# Patient Record
Sex: Female | Born: 1935 | Race: White | Hispanic: No | Smoking: Former smoker
Health system: Southern US, Community
[De-identification: ages and names within clinical notes are randomized; demographics above are authoritative.]

## PROBLEM LIST (undated history)

## (undated) DIAGNOSIS — I10 Essential (primary) hypertension: Secondary | ICD-10-CM

## (undated) DIAGNOSIS — E109 Type 1 diabetes mellitus without complications: Secondary | ICD-10-CM

## (undated) DIAGNOSIS — I2510 Atherosclerotic heart disease of native coronary artery without angina pectoris: Secondary | ICD-10-CM

## (undated) DIAGNOSIS — I639 Cerebral infarction, unspecified: Secondary | ICD-10-CM

## (undated) DIAGNOSIS — J449 Chronic obstructive pulmonary disease, unspecified: Secondary | ICD-10-CM

## (undated) HISTORY — PX: TOTAL KNEE ARTHROPLASTY: SHX125

---

## 2005-11-19 ENCOUNTER — Ambulatory Visit: Payer: Self-pay | Admitting: Family Medicine

## 2005-11-21 ENCOUNTER — Ambulatory Visit: Payer: Self-pay | Admitting: Family Medicine

## 2005-12-05 ENCOUNTER — Ambulatory Visit: Payer: Self-pay | Admitting: Family Medicine

## 2006-01-05 ENCOUNTER — Ambulatory Visit: Payer: Self-pay | Admitting: Family Medicine

## 2006-11-20 ENCOUNTER — Emergency Department: Payer: Self-pay | Admitting: Emergency Medicine

## 2006-12-15 ENCOUNTER — Emergency Department: Payer: Self-pay | Admitting: Emergency Medicine

## 2007-03-29 ENCOUNTER — Ambulatory Visit: Payer: Self-pay | Admitting: Family Medicine

## 2007-05-06 ENCOUNTER — Other Ambulatory Visit: Payer: Self-pay

## 2007-05-06 ENCOUNTER — Ambulatory Visit: Payer: Self-pay | Admitting: Specialist

## 2007-05-06 ENCOUNTER — Emergency Department: Payer: Self-pay | Admitting: Emergency Medicine

## 2007-05-06 ENCOUNTER — Ambulatory Visit: Payer: Self-pay | Admitting: Cardiology

## 2007-05-13 ENCOUNTER — Ambulatory Visit: Payer: Self-pay | Admitting: Specialist

## 2007-12-05 ENCOUNTER — Other Ambulatory Visit: Payer: Self-pay

## 2007-12-05 ENCOUNTER — Ambulatory Visit: Payer: Self-pay | Admitting: Specialist

## 2008-04-07 ENCOUNTER — Ambulatory Visit: Payer: Self-pay | Admitting: Specialist

## 2008-04-14 ENCOUNTER — Inpatient Hospital Stay: Payer: Self-pay | Admitting: Specialist

## 2008-10-11 ENCOUNTER — Encounter: Payer: Self-pay | Admitting: Specialist

## 2008-11-04 ENCOUNTER — Encounter: Payer: Self-pay | Admitting: Specialist

## 2008-12-05 ENCOUNTER — Encounter: Payer: Self-pay | Admitting: Specialist

## 2009-01-05 ENCOUNTER — Encounter: Payer: Self-pay | Admitting: Specialist

## 2009-02-17 ENCOUNTER — Encounter: Payer: Self-pay | Admitting: Internal Medicine

## 2009-04-26 ENCOUNTER — Ambulatory Visit: Payer: Self-pay | Admitting: Internal Medicine

## 2010-12-04 ENCOUNTER — Emergency Department: Payer: Self-pay | Admitting: Emergency Medicine

## 2013-05-01 ENCOUNTER — Emergency Department (HOSPITAL_COMMUNITY): Payer: Medicare Other

## 2013-05-01 ENCOUNTER — Inpatient Hospital Stay (HOSPITAL_COMMUNITY): Payer: Medicare Other

## 2013-05-01 ENCOUNTER — Inpatient Hospital Stay (HOSPITAL_COMMUNITY)
Admission: EM | Admit: 2013-05-01 | Discharge: 2013-05-13 | DRG: 871 | Disposition: A | Discharge status: Home Health | Payer: Medicare Other | Attending: Internal Medicine | Admitting: Internal Medicine

## 2013-05-01 DIAGNOSIS — M1990 Unspecified osteoarthritis, unspecified site: Secondary | ICD-10-CM | POA: Diagnosis present

## 2013-05-01 DIAGNOSIS — J441 Chronic obstructive pulmonary disease with (acute) exacerbation: Secondary | ICD-10-CM

## 2013-05-01 DIAGNOSIS — J9600 Acute respiratory failure, unspecified whether with hypoxia or hypercapnia: Secondary | ICD-10-CM

## 2013-05-01 DIAGNOSIS — J9601 Acute respiratory failure with hypoxia: Secondary | ICD-10-CM

## 2013-05-01 DIAGNOSIS — E872 Acidosis, unspecified: Secondary | ICD-10-CM | POA: Diagnosis present

## 2013-05-01 DIAGNOSIS — J9690 Respiratory failure, unspecified, unspecified whether with hypoxia or hypercapnia: Secondary | ICD-10-CM

## 2013-05-01 DIAGNOSIS — Z6835 Body mass index (BMI) 35.0-35.9, adult: Secondary | ICD-10-CM

## 2013-05-01 DIAGNOSIS — E119 Type 2 diabetes mellitus without complications: Secondary | ICD-10-CM | POA: Diagnosis present

## 2013-05-01 DIAGNOSIS — J101 Influenza due to other identified influenza virus with other respiratory manifestations: Secondary | ICD-10-CM | POA: Diagnosis present

## 2013-05-01 DIAGNOSIS — E871 Hypo-osmolality and hyponatremia: Secondary | ICD-10-CM | POA: Diagnosis not present

## 2013-05-01 DIAGNOSIS — E876 Hypokalemia: Secondary | ICD-10-CM | POA: Diagnosis not present

## 2013-05-01 DIAGNOSIS — I509 Heart failure, unspecified: Secondary | ICD-10-CM | POA: Diagnosis present

## 2013-05-01 DIAGNOSIS — E669 Obesity, unspecified: Secondary | ICD-10-CM | POA: Diagnosis present

## 2013-05-01 DIAGNOSIS — Z7401 Bed confinement status: Secondary | ICD-10-CM

## 2013-05-01 DIAGNOSIS — I5041 Acute combined systolic (congestive) and diastolic (congestive) heart failure: Secondary | ICD-10-CM | POA: Diagnosis present

## 2013-05-01 DIAGNOSIS — I471 Supraventricular tachycardia: Secondary | ICD-10-CM

## 2013-05-01 DIAGNOSIS — I214 Non-ST elevation (NSTEMI) myocardial infarction: Secondary | ICD-10-CM | POA: Diagnosis not present

## 2013-05-01 DIAGNOSIS — R4020 Unspecified coma: Secondary | ICD-10-CM

## 2013-05-01 DIAGNOSIS — G9340 Encephalopathy, unspecified: Secondary | ICD-10-CM | POA: Diagnosis present

## 2013-05-01 DIAGNOSIS — J111 Influenza due to unidentified influenza virus with other respiratory manifestations: Secondary | ICD-10-CM | POA: Diagnosis present

## 2013-05-01 DIAGNOSIS — F411 Generalized anxiety disorder: Secondary | ICD-10-CM | POA: Diagnosis present

## 2013-05-01 DIAGNOSIS — A419 Sepsis, unspecified organism: Principal | ICD-10-CM | POA: Diagnosis present

## 2013-05-01 DIAGNOSIS — I10 Essential (primary) hypertension: Secondary | ICD-10-CM | POA: Diagnosis present

## 2013-05-01 DIAGNOSIS — Z66 Do not resuscitate: Secondary | ICD-10-CM | POA: Diagnosis not present

## 2013-05-01 DIAGNOSIS — I459 Conduction disorder, unspecified: Secondary | ICD-10-CM | POA: Diagnosis not present

## 2013-05-01 DIAGNOSIS — Z87891 Personal history of nicotine dependence: Secondary | ICD-10-CM

## 2013-05-01 LAB — COMPREHENSIVE METABOLIC PANEL
ALT: 10 U/L (ref 0–35)
ALT: 16 U/L (ref 0–35)
AST: 44 U/L — ABNORMAL HIGH (ref 0–37)
AST: 55 U/L — ABNORMAL HIGH (ref 0–37)
Albumin: 2.6 g/dL — ABNORMAL LOW (ref 3.5–5.2)
Alkaline Phosphatase: 86 U/L (ref 39–117)
CO2: 23 mEq/L (ref 19–32)
CO2: 22 mEq/L (ref 19–32)
Calcium: 7.5 mg/dL — ABNORMAL LOW (ref 8.4–10.5)
Chloride: 96 mEq/L (ref 96–112)
Creatinine, Ser: 0.69 mg/dL (ref 0.50–1.10)
GFR calc Af Amer: >90 mL/min (ref >90)
GFR calc non Af Amer: 81 mL/min — ABNORMAL LOW (ref >90)
GFR calc non Af Amer: 82 mL/min — ABNORMAL LOW (ref >90)
Glucose, Bld: 385 mg/dL — ABNORMAL HIGH (ref 70–99)
Potassium: 5 mEq/L (ref 3.5–5.1)
Sodium: 129 mEq/L — ABNORMAL LOW (ref 135–145)
Sodium: 130 mEq/L — ABNORMAL LOW (ref 135–145)
Total Protein: 8.1 g/dL (ref 6.0–8.3)
Total Protein: 7.1 g/dL (ref 6.0–8.3)

## 2013-05-01 LAB — CBC WITH DIFFERENTIAL/PLATELET
Basophils Relative: 0 % (ref 0–1)
Eosinophils Absolute: 0.3 10*3/uL (ref 0.0–0.7)
Eosinophils Absolute: 0 10*3/uL (ref 0.0–0.7)
Eosinophils Relative: 0 % (ref 0–5)
HCT: 40 % (ref 36.0–46.0)
Hemoglobin: 14.3 g/dL (ref 12.0–15.0)
Hemoglobin: 12.7 g/dL (ref 12.0–15.0)
Lymphocytes Relative: 26 % (ref 12–46)
Lymphs Abs: 7.3 10*3/uL — ABNORMAL HIGH (ref 0.7–4.0)
Lymphs Abs: 1.6 10*3/uL (ref 0.7–4.0)
MCH: 29.2 pg (ref 26.0–34.0)
MCH: 28.2 pg (ref 26.0–34.0)
MCHC: 31.8 g/dL (ref 30.0–36.0)
MCV: 90 fL (ref 78.0–100.0)
MCV: 88.9 fL (ref 78.0–100.0)
Monocytes Absolute: 1.4 10*3/uL — ABNORMAL HIGH (ref 0.1–1.0)
Monocytes Absolute: 1.6 10*3/uL — ABNORMAL HIGH (ref 0.1–1.0)
Monocytes Relative: 7 % (ref 3–12)
Neutro Abs: 18.2 10*3/uL — ABNORMAL HIGH (ref 1.7–7.7)
Neutrophils Relative %: 68 % (ref 43–77)
Neutrophils Relative %: 85 % — ABNORMAL HIGH (ref 43–77)
Platelets: 436 10*3/uL — ABNORMAL HIGH (ref 150–400)
RBC: 4.89 MIL/uL (ref 3.87–5.11)
RBC: 4.5 MIL/uL (ref 3.87–5.11)
RDW: 13.4 % (ref 11.5–15.5)
WBC: 28.2 10*3/uL — ABNORMAL HIGH (ref 4.0–10.5)

## 2013-05-01 LAB — POCT I-STAT 3, ART BLOOD GAS (G3+)
Acid-base deficit: 6 mmol/L — ABNORMAL HIGH (ref 0.0–2.0)
Bicarbonate: 24.5 mEq/L — ABNORMAL HIGH (ref 20.0–24.0)
O2 Saturation: 100 %
TCO2: 27 mmol/L (ref 0–100)
pCO2 arterial: 74.6 mmHg (ref 35.0–45.0)
pH, Arterial: 7.131 — CL (ref 7.350–7.450)
pO2, Arterial: 462 mmHg — ABNORMAL HIGH (ref 80.0–100.0)

## 2013-05-01 LAB — URINE MICROSCOPIC-ADD ON

## 2013-05-01 LAB — PRO B NATRIURETIC PEPTIDE: Pro B Natriuretic peptide (BNP): 3477 pg/mL — ABNORMAL HIGH (ref 0–450)

## 2013-05-01 LAB — RAPID URINE DRUG SCREEN, HOSP PERFORMED
Amphetamines: NOT DETECTED
Barbiturates: NOT DETECTED
Cocaine: NOT DETECTED
Opiates: NOT DETECTED
Tetrahydrocannabinol: NOT DETECTED

## 2013-05-01 LAB — URINALYSIS, ROUTINE W REFLEX MICROSCOPIC
Bilirubin Urine: NEGATIVE
Ketones, ur: NEGATIVE mg/dL
Nitrite: NEGATIVE
Protein, ur: >300 mg/dL — AB
pH: 7 (ref 5.0–8.0)

## 2013-05-01 LAB — AMYLASE: Amylase: 127 U/L — ABNORMAL HIGH (ref 0–105)

## 2013-05-01 LAB — CG4 I-STAT (LACTIC ACID)
Lactic Acid, Venous: 3.24 mmol/L — ABNORMAL HIGH (ref 0.5–2.2)
Lactic Acid, Venous: 3.42 mmol/L — ABNORMAL HIGH (ref 0.5–2.2)

## 2013-05-01 LAB — PHOSPHORUS: Phosphorus: 4.4 mg/dL (ref 2.3–4.6)

## 2013-05-01 LAB — PROTIME-INR: INR: 1.15 (ref 0.00–1.49)

## 2013-05-01 LAB — TROPONIN I: Troponin I: 1.3 ng/mL (ref <0.30)

## 2013-05-01 LAB — LACTIC ACID, PLASMA: Lactic Acid, Venous: 2.4 mmol/L — ABNORMAL HIGH (ref 0.5–2.2)

## 2013-05-01 LAB — STREP PNEUMONIAE URINARY ANTIGEN: Strep Pneumo Urinary Antigen: NEGATIVE

## 2013-05-01 LAB — LIPASE, BLOOD: Lipase: 12 U/L (ref 11–59)

## 2013-05-01 MED ORDER — MAGNESIUM SULFATE 40 MG/ML IJ SOLN
4.0000 g | Freq: Once | INTRAMUSCULAR | Status: AC
  Administered 2013-05-02: 4 g via INTRAVENOUS
  Filled 2013-05-01: qty 100

## 2013-05-01 MED ORDER — ETOMIDATE 2 MG/ML IV SOLN
INTRAVENOUS | Status: AC
  Administered 2013-05-01: 30 mg via INTRAVENOUS
  Filled 2013-05-01: qty 20

## 2013-05-01 MED ORDER — ALBUTEROL SULFATE (5 MG/ML) 0.5% IN NEBU
2.5000 mg | INHALATION_SOLUTION | RESPIRATORY_TRACT | Status: DC
  Administered 2013-05-01 – 2013-05-03 (×10): 2.5 mg via RESPIRATORY_TRACT
  Filled 2013-05-01 (×10): qty 0.5

## 2013-05-01 MED ORDER — ASPIRIN 300 MG RE SUPP
300.0000 mg | RECTAL | Status: AC
  Administered 2013-05-01: 300 mg via RECTAL
  Filled 2013-05-01: qty 1

## 2013-05-01 MED ORDER — MIDAZOLAM HCL 2 MG/2ML IJ SOLN
2.0000 mg | Freq: Once | INTRAMUSCULAR | Status: AC
  Administered 2013-05-01: 2 mg via INTRAVENOUS
  Filled 2013-05-01: qty 2

## 2013-05-01 MED ORDER — ROCURONIUM BROMIDE 50 MG/5ML IV SOLN
25.0000 mg | Freq: Once | INTRAVENOUS | Status: AC
  Administered 2013-05-01: 25 mg via INTRAVENOUS
  Filled 2013-05-01: qty 2.5

## 2013-05-01 MED ORDER — SODIUM CHLORIDE 0.9 % IV SOLN
INTRAVENOUS | Status: DC
  Administered 2013-05-01: 50 mL/h via INTRAVENOUS
  Administered 2013-05-03 – 2013-05-10 (×4): via INTRAVENOUS

## 2013-05-01 MED ORDER — SODIUM CHLORIDE 0.9 % IV SOLN: 10.0000 ug/h | INTRAVENOUS | Status: DC

## 2013-05-01 MED ORDER — VANCOMYCIN HCL IN DEXTROSE 750-5 MG/150ML-% IV SOLN
750.0000 mg | Freq: Two times a day (BID) | INTRAVENOUS | Status: DC
  Administered 2013-05-02 – 2013-05-03 (×3): 750 mg via INTRAVENOUS
  Filled 2013-05-01 (×5): qty 150

## 2013-05-01 MED ORDER — PROPOFOL 10 MG/ML IV EMUL
0.0000 ug/kg/min | INTRAVENOUS | Status: DC
  Administered 2013-05-01: 5 ug/kg/min via INTRAVENOUS
  Administered 2013-05-02: 30 ug/kg/min via INTRAVENOUS
  Administered 2013-05-03: 40 ug/kg/min via INTRAVENOUS
  Filled 2013-05-01 (×4): qty 100

## 2013-05-01 MED ORDER — METHYLPREDNISOLONE SODIUM SUCC 125 MG IJ SOLR
80.0000 mg | Freq: Three times a day (TID) | INTRAMUSCULAR | Status: DC
  Administered 2013-05-01 – 2013-05-06 (×14): 80 mg via INTRAVENOUS
  Filled 2013-05-01 (×17): qty 1.28

## 2013-05-01 MED ORDER — FENTANYL CITRATE 0.05 MG/ML IJ SOLN
100.0000 ug | Freq: Once | INTRAMUSCULAR | Status: AC
  Administered 2013-05-01: 100 ug via INTRAVENOUS

## 2013-05-01 MED ORDER — ASPIRIN 81 MG PO CHEW: 324.0000 mg | CHEWABLE_TABLET | ORAL | Status: AC

## 2013-05-01 MED ORDER — SODIUM CHLORIDE 0.9 % IV SOLN: 250.0000 mL | INTRAVENOUS | Status: DC | PRN

## 2013-05-01 MED ORDER — IPRATROPIUM BROMIDE 0.02 % IN SOLN
0.5000 mg | RESPIRATORY_TRACT | Status: DC
  Administered 2013-05-01 – 2013-05-06 (×29): 0.5 mg via RESPIRATORY_TRACT
  Filled 2013-05-01 (×33): qty 2.5

## 2013-05-01 MED ORDER — ASPIRIN 81 MG PO CHEW
81.0000 mg | CHEWABLE_TABLET | Freq: Every day | ORAL | Status: DC
  Administered 2013-05-02 (×2): 81 mg via ORAL
  Filled 2013-05-01 (×2): qty 1

## 2013-05-01 MED ORDER — DEXTROSE 5 % IV SOLN
2.0000 g | Freq: Once | INTRAVENOUS | Status: AC
  Administered 2013-05-01: 2 g via INTRAVENOUS
  Filled 2013-05-01: qty 2

## 2013-05-01 MED ORDER — PANTOPRAZOLE SODIUM 40 MG PO PACK
40.0000 mg | PACK | Freq: Every day | ORAL | Status: DC
  Administered 2013-05-02: 40 mg
  Filled 2013-05-01 (×3): qty 20

## 2013-05-01 MED ORDER — OSELTAMIVIR PHOSPHATE 75 MG PO CAPS
75.0000 mg | ORAL_CAPSULE | Freq: Two times a day (BID) | ORAL | Status: DC
Start: 2013-05-01 — End: 2013-05-02
  Administered 2013-05-01 – 2013-05-02 (×2): 75 mg via ORAL
  Filled 2013-05-01 (×4): qty 1

## 2013-05-01 MED ORDER — ACETAMINOPHEN 650 MG RE SUPP
650.0000 mg | Freq: Once | RECTAL | Status: AC
  Administered 2013-05-01: 650 mg via RECTAL
  Filled 2013-05-01: qty 1

## 2013-05-01 MED ORDER — INSULIN ASPART 100 UNIT/ML ~~LOC~~ SOLN
0.0000 [IU] | SUBCUTANEOUS | Status: DC
  Administered 2013-05-02 (×3): 11 [IU] via SUBCUTANEOUS

## 2013-05-01 MED ORDER — LIDOCAINE HCL (CARDIAC) 20 MG/ML IV SOLN
INTRAVENOUS | Status: AC
  Filled 2013-05-01: qty 5

## 2013-05-01 MED ORDER — MIDAZOLAM HCL 2 MG/2ML IJ SOLN: 2.0000 mg | Freq: Once | INTRAMUSCULAR | Status: DC

## 2013-05-01 MED ORDER — HEPARIN SODIUM (PORCINE) 5000 UNIT/ML IJ SOLN
5000.0000 [IU] | Freq: Three times a day (TID) | INTRAMUSCULAR | Status: DC
  Administered 2013-05-01 – 2013-05-02 (×2): 5000 [IU] via SUBCUTANEOUS
  Filled 2013-05-01 (×5): qty 1

## 2013-05-01 MED ORDER — INSULIN GLARGINE 100 UNIT/ML ~~LOC~~ SOLN
10.0000 [IU] | Freq: Every day | SUBCUTANEOUS | Status: DC
  Administered 2013-05-02: 10 [IU] via SUBCUTANEOUS
  Filled 2013-05-01 (×2): qty 0.1

## 2013-05-01 MED ORDER — INSULIN ASPART 100 UNIT/ML ~~LOC~~ SOLN
2.0000 [IU] | SUBCUTANEOUS | Status: DC
  Administered 2013-05-01: 6 [IU] via SUBCUTANEOUS

## 2013-05-01 MED ORDER — VANCOMYCIN HCL IN DEXTROSE 1-5 GM/200ML-% IV SOLN
1000.0000 mg | Freq: Once | INTRAVENOUS | Status: AC
  Administered 2013-05-01: 1000 mg via INTRAVENOUS
  Filled 2013-05-01: qty 200

## 2013-05-01 MED ORDER — SODIUM CHLORIDE 0.9 % IV BOLUS (SEPSIS)
1000.0000 mL | Freq: Once | INTRAVENOUS | Status: AC
  Administered 2013-05-01: 1000 mL via INTRAVENOUS

## 2013-05-01 MED ORDER — ROCURONIUM BROMIDE 50 MG/5ML IV SOLN
INTRAVENOUS | Status: AC
  Administered 2013-05-01: 25 mg via INTRAVENOUS
  Filled 2013-05-01: qty 2

## 2013-05-01 MED ORDER — PIPERACILLIN-TAZOBACTAM 3.375 G IVPB 30 MIN
3.3750 g | Freq: Once | INTRAVENOUS | Status: AC
  Administered 2013-05-01: 3.375 g via INTRAVENOUS

## 2013-05-01 MED ORDER — SODIUM CHLORIDE 0.9 % IV SOLN: 1.0000 mg/h | INTRAVENOUS | Status: DC

## 2013-05-01 MED ORDER — PIPERACILLIN-TAZOBACTAM 3.375 G IVPB
3.3750 g | Freq: Three times a day (TID) | INTRAVENOUS | Status: DC
  Administered 2013-05-02 – 2013-05-05 (×10): 3.375 g via INTRAVENOUS
  Filled 2013-05-01 (×13): qty 50

## 2013-05-01 MED ORDER — SUCCINYLCHOLINE CHLORIDE 20 MG/ML IJ SOLN
INTRAMUSCULAR | Status: AC
  Filled 2013-05-01: qty 1

## 2013-05-01 MED ORDER — ETOMIDATE 2 MG/ML IV SOLN
30.0000 mg | Freq: Once | INTRAVENOUS | Status: AC
  Administered 2013-05-01: 30 mg via INTRAVENOUS

## 2013-05-01 NOTE — ED Notes (Signed)
Critical care at bedside  

## 2013-05-01 NOTE — ED Notes (Signed)
Provider talking with family to verify allergies.

## 2013-05-01 NOTE — ED Notes (Signed)
NOTIFIED DR/. GOLDSTON IN PERSON OF PATIENTS LAB RESULTS OF CG4 LACTIC ACID = =3.42 mmoI/L ,@17 ;32 PM , 12/26 /2014.

## 2013-05-01 NOTE — ED Notes (Signed)
EMS called unresponsive CBG 390 gave narcan 4mg  with some improvement with movement pupils pinpoint. Upon arrival patient unresponsive protecting own airway EDP at bedside.

## 2013-05-01 NOTE — ED Notes (Signed)
Notified CT delay on CT head respiratory has to adjust ET tube. Will call when completed.

## 2013-05-01 NOTE — ED Provider Notes (Signed)
CSN: [id number]     Arrival date & time 05/01/13  1619 History   First MD Initiated Contact with Patient 05/01/13 1619     Chief Complaint  Patient presents with  . Altered Mental Status   (Consider location/radiation/quality/duration/timing/severity/associated sxs/prior Treatment) The history is provided by the EMS personnel. The history is limited by the condition of the patient.   Level 5 caveat- unresponsive. This is a 78 year old female with past medical history significant for hypertension and COPD presenting to the ED for unresponsiveness.  Pt lives with her daughter at their home, limited hx was provided.  EMS was initially called for cardiac arrest however on arrival, pt had palpable pulses, CPR was not initiated.  Pt was found to be lying in feces and urine, all with strong odor.  Unsure how long pt was in this condition.  Pt has remained unresponsive during EMS transport despite use of narcan.  CBG 391, tachycardic to 140's, hypotensive on arrival.  Family has arrived-- states pt was normal until around 12pm today when grandson brought her lunch.  At baseline, pt is non-ambulatory at baseline but does all ADL's for herself including medication administration.  No prior hx of TIA or stroke but did have seizures during prior admission-- unknown cause.  Daughter states prior episodes of DKA and urosepsis with similar presentation.  Family states they have attempted to acquire home health or SNF placement, however pt refuses any kind of help.  Pt is full code per family.  No past medical history on file. No past surgical history on file. No family history on file. History  Substance Use Topics  . Smoking status: Not on file  . Smokeless tobacco: Not on file  . Alcohol Use: Not on file   OB History   No data available     Review of Systems  Unable to perform ROS: Acuity of condition    Allergies  Review of patient's allergies indicates not on file.  Home Medications  No  current outpatient prescriptions on file. There were no vitals taken for this visit.  Physical Exam  Nursing note and vitals reviewed. Constitutional: She appears distressed. Face mask in place.  Obese, Pt soaked in foul smelling urine, minimally responsive  HENT:  Head: Normocephalic and atraumatic.  Mouth/Throat: Oropharynx is clear and moist.  Eyes: Conjunctivae and EOM are normal. Pupils are equal, round, and reactive to light.  Pinpoint pupils but reactive bilaterally  Neck: Normal range of motion. Neck supple.  Cardiovascular: Regular rhythm and normal heart sounds.  Tachycardia present.   Pulmonary/Chest: Breath sounds normal. Accessory muscle usage present. She is in respiratory distress.  Shallow, severely labored breathing with accessory muscle use and obvious retractions  Abdominal: Soft. Bowel sounds are normal.  Neurological: GCS eye subscore is 3. GCS verbal subscore is 1. GCS motor subscore is 4.  Pt will open eyes to verbal stimuli, some withdrawal with pain, GCS 8  Skin:  Pale, dry  Psychiatric: She has a normal mood and affect.    ED Course  INTUBATION Date/Time: 05/01/2013 6:41 PM Performed by: Garlon Hatchet Authorized by: Garlon Hatchet   (including critical care time)   INTUBATION Performed by: Garlon Hatchet  Required items: required blood products, implants, devices, and special equipment available Patient identity confirmed: provided demographic data and hospital-assigned identification number Time out: Immediately prior to procedure a "time out" was called to verify the correct patient, procedure, equipment, support staff and site/side marked as required.  Indications:  respiratory failure, unresponsive  Intubation method: direct laryngoscopy  Preoxygenation: BVM  Sedatives: 30 Etomidate Paralytic: 50 Rocuronium  Tube Size: 6.5 cuffed, 21 at lip  Post-procedure assessment: chest rise and ETCO2 monitor Breath sounds: equal and absent over  the epigastrium Tube secured with: ETT holder Chest x-ray interpreted by radiologist and me.  Chest x-ray findings: endotracheal tube in appropriate position  Patient tolerated the procedure well with no immediate complications.    CRITICAL CARE Performed by: Garlon Hatchet   Total critical care time: 60  Critical care time was exclusive of separately billable procedures and treating other patients.  Critical care was necessary to treat or prevent imminent or life-threatening deterioration.  Critical care was time spent personally by me on the following activities: development of treatment plan with patient and/or surrogate as well as nursing, discussions with consultants, evaluation of patient's response to treatment, examination of patient, obtaining history from patient or surrogate, ordering and performing treatments and interventions, ordering and review of laboratory studies, ordering and review of radiographic studies, pulse oximetry and re-evaluation of patient's condition.  Labs Review Labs Reviewed  CBC WITH DIFFERENTIAL - Abnormal; Notable for the following:    WBC 28.2 (*)    Platelets 436 (*)    Neutro Abs 19.2 (*)    Lymphs Abs 7.3 (*)    Monocytes Absolute 1.4 (*)    All other components within normal limits  COMPREHENSIVE METABOLIC PANEL - Abnormal; Notable for the following:    Sodium 129 (*)    Chloride 91 (*)    Glucose, Bld 385 (*)    Albumin 3.1 (*)    AST 44 (*)    GFR calc non Af Amer 81 (*)    All other components within normal limits  URINALYSIS, ROUTINE W REFLEX MICROSCOPIC - Abnormal; Notable for the following:    Glucose, UA 250 (*)    Hgb urine dipstick MODERATE (*)    Protein, ur >300 (*)    All other components within normal limits  GLUCOSE, CAPILLARY - Abnormal; Notable for the following:    Glucose-Capillary 336 (*)    All other components within normal limits  URINE MICROSCOPIC-ADD ON - Abnormal; Notable for the following:    Squamous  Epithelial / LPF FEW (*)    Bacteria, UA FEW (*)    All other components within normal limits  POCT I-STAT 3, BLOOD GAS (G3+) - Abnormal; Notable for the following:    pH, Arterial 7.131 (*)    pCO2 arterial 74.6 (*)    pO2, Arterial 462.0 (*)    Bicarbonate 24.5 (*)    Acid-base deficit 6.0 (*)    All other components within normal limits  CG4 I-STAT (LACTIC ACID) - Abnormal; Notable for the following:    Lactic Acid, Venous 3.24 (*)    All other components within normal limits  CG4 I-STAT (LACTIC ACID) - Abnormal; Notable for the following:    Lactic Acid, Venous 3.42 (*)    All other components within normal limits  CG4 I-STAT (LACTIC ACID) - Abnormal; Notable for the following:    Lactic Acid, Venous 2.95 (*)    All other components within normal limits  CULTURE, BLOOD (ROUTINE X 2)  CULTURE, BLOOD (ROUTINE X 2)  URINE CULTURE  RESPIRATORY VIRUS PANEL  CULTURE, BLOOD (ROUTINE X 2)  CULTURE, BLOOD (ROUTINE X 2)  URINE CULTURE  CULTURE, RESPIRATORY (NON-EXPECTORATED)  TROPONIN I  PROTIME-INR  CK  URINE RAPID DRUG SCREEN (HOSP PERFORMED)  INFLUENZA  PANEL BY PCR  LEGIONELLA ANTIGEN, URINE  STREP PNEUMONIAE URINARY ANTIGEN  COMPREHENSIVE METABOLIC PANEL  MAGNESIUM  PHOSPHORUS  AMYLASE  LIPASE, BLOOD  TROPONIN I  TROPONIN I  TROPONIN I  LACTIC ACID, PLASMA  PROCALCITONIN  PRO B NATRIURETIC PEPTIDE  CORTISOL  CBC WITH DIFFERENTIAL  PROTIME-INR  APTT  D-DIMER, QUANTITATIVE  BLOOD GAS, ARTERIAL  CBC  BASIC METABOLIC PANEL  BLOOD GAS, ARTERIAL  MAGNESIUM  PHOSPHORUS  TRIGLYCERIDES   Imaging Review Dg Chest Portable 1 View  05/01/2013   CLINICAL DATA:  Assess endotracheal tube position.  EXAM: PORTABLE CHEST - 1 VIEW  COMPARISON:  None.  FINDINGS: The endotracheal tube tip lies 5 cm above the crotch of the carina. The lungs are mildly hyperinflated. There is no alveolar infiltrate. There is minimal prominence of the interstitial markings bilaterally. The  cardiopericardial silhouette is top-normal in size. The pulmonary vascularity is not engorged. There is no pleural effusion or pneumothorax. The observed portions of the bony thorax exhibit no acute abnormalities.  IMPRESSION: The endotracheal tube tip appears to be well-positioned. There is no evidence of pneumonia. Minimal prominence of the pulmonary interstitial markings is nonspecific.   Electronically Signed   By: David  [place]   On: 05/01/2013 17:12    EKG Interpretation    Date/Time:  Friday May 01 2013 16:33:00 EST Ventricular Rate:  139 PR Interval:  83 QRS Duration: 113 QT Interval:  366 QTC Calculation: 557 R Axis:   -76 Text Interpretation:  Sinus or ectopic atrial tachycardia Left atrial enlargement Abnormal R-wave progression, late transition Left bundle branch block Prolonged QT interval Probable RV involvement, suggest recording right precordial leads No old tracing to compare Confirmed by GOLDSTON  MD, SCOTT ([id number]) on 05/01/2013 6:06:09 PM            MDM   1. Respiratory failure   2. Respiratory acidosis    On arrival pt covered in foul smelling urine, minimally responsive, GCS 8 with signs of respiratory distress.  Pt was intubated without immediate complication.  Pt remains febrile and tachycardic, Level 1 code sepsis activated with suspected urinary source-- broad spectrum vanc and zosyn started in the ED, critical care consulted.    EKG sinus tachycardia.  Blood gas with respiratory acidosis.  Significant leukocytosis at 28.2, lactic acid elevated at 3.24.  Hyponatremia and hypochloridemia at 129 and 91 respectively. U/a with only few bacteria. CT head pending at this time.  Blood and urine cultures obtained.  Critical care, Dr. Marchelle Gearing, has evaluated pt in the ED and will admit to ICU.  Garlon Hatchet, PA-C 05/01/13 2133

## 2013-05-01 NOTE — H&P (Addendum)
PULMONARY  / CRITICAL CARE MEDICINE  Name: Brenda Hogan MRN: [id number] DOB: 04-27-1935 PCP Hyman Hopes, MD    ADMISSION DATE:  05/01/2013  LOS 0 days   CONSULTATION DATE:  05/01/2013   REFERRING MD :  Dr Criss Alvine of [facility] PRIMARY SERVICE: PCCM  CHIEF COMPLAINT:  Acute resp falure, acidosis, unresponsive  BRIEF PATIENT DESCRIPTION: see hpi  SIGNIFICANT EVENTS / STUDIES:  05/01/2013 - Admit   LINES / TUBES: ETT 05/01/2013   CULTURES: Blood  Urine  Trach aspirate Flu PCR Multoplex Virus PCR  ANTIBIOTICS: ceftriazone 12/26 >> vanc 12/26>> tamifly 12/26 (5 days)  HISTORY OF PRESENT ILLNESS:   78 year old obese female, bed ridden x 7 years with prior smoking and hx copd nos. She  can transfer with assist only due to "bad knees".  Has has exposure to non-flu resp virus amongst grand and great grand kids last few days. She did have flu shot. Was well until thiis morning when grand daughter noticed increaed cough and congestion in morning with less appeptie. By late afternnon patient was getting more somnolent and rapildy becaome cyanotic and unresponsive. Blood suyar 380mg %.  EMS found aptient unresponsive. Patient intubated in ER with size 6.0 et tube. Never lost pulse. Per family similar episode in past due to UTI and high sugar. Initialy concern at ER was UTI but urine clear. Post intubaion patient febrile and ABG showed significant acute resp acidosis post intubaion suggesting AECOPD. PCCM admitting 05/01/2013  Note: foul body odor across all room   PAST MEDICAL HISTORY :  No past medical history on file.   No family history on file.   History   Social History  . Marital Status: Divorced    Spouse Name: N/A    Number of Children: N/A  . Years of Education: N/A   Occupational History  . Not on file.   Social History Main Topics  . Smoking status: Not on file  . Smokeless tobacco: Not on file  . Alcohol Use: Not on file  . Drug Use:  Not on file  . Sexual Activity: Not on file   Other Topics Concern  . Not on file   Social History Narrative  . No narrative on file     Allergies not on file    (Not in an outpatient encounter)     REVIEW OF SYSTEMS:  Not elicitable bedause patient intuated and unresponsive  SUBJECTIVE:   VITAL SIGNS: Filed Vitals:   05/01/13 1715 05/01/13 1730 05/01/13 1800 05/01/13 1806  BP: 155/76 151/56 155/65   Pulse:  138 135 134  Temp:      TempSrc:      Resp: 22 20 24 30   Height:      SpO2: 100% 100% 99% 99%      \   VENTILATOR SETTINGS: Vent Mode:  [-] PRVC FiO2 (%):  [30 %] 30 % Set Rate:  [28 bmp] 28 bmp Vt Set:  [420 mL] 420 mL PEEP:  [5 cmH20] 5 cmH20 Plateau Pressure:  [4 cmH20-21 cmH20] 20 cmH20 INTAKE / OUTPUT:       PHYSICAL EXAMINATION: General:  Obese female, looks critically ill Neuro:  Unresponsive witout sedation HEENT:  No neck stiffness, PEERL +. No seizure observed. Normal muscle tone Cardiovascular:  RRR +. Hypertensive Lungs:  Coarse. Perioidic vent dysnchrony Abdomen:  Obese, soft, non-tender, no organomegaly Musculoskeletal:  Non cyanosis, no clubbing, no edema Skin:  DRy skin LE. [place] unhygienic. Ventrally intact sking  LABS: PULMONARY  Recent Labs Lab 05/01/13 1723  PHART 7.131*  PCO2ART 74.6*  PO2ART 462.0*  HCO3 24.5*  TCO2 27  O2SAT 100.0    CBC  Recent Labs Lab 05/01/13 1643  HGB 14.3  HCT 44.0  WBC 28.2*  PLT 436*    COAGULATION  Recent Labs Lab 05/01/13 1643  INR 1.10    CARDIAC   Recent Labs Lab 05/01/13 1648  TROPONINI <0.30   No results found for this basename: PROBNP,  in the last 168 hours   CHEMISTRY  Recent Labs Lab 05/01/13 1643  NA 129*  K 5.0  CL 91*  CO2 23  GLUCOSE 385*  BUN 15  CREATININE 0.74  CALCIUM 8.5   The CrCl is unknown because both a height and weight (above a minimum accepted value) are required for this calculation.   LIVER  Recent Labs Lab  05/01/13 1643  AST 44*  ALT 10  ALKPHOS 86  BILITOT 0.6  PROT 8.1  ALBUMIN 3.1*  INR 1.10     INFECTIOUS  Recent Labs Lab 05/01/13 1710 05/01/13 1729  LATICACIDVEN 3.24* 3.42*     ENDOCRINE CBG (last 3)   Recent Labs  05/01/13 1645  GLUCAP 336*         IMAGING x48h  Dg Chest Portable 1 View  05/01/2013   CLINICAL DATA:  Assess endotracheal tube position.  EXAM: PORTABLE CHEST - 1 VIEW  COMPARISON:  None.  FINDINGS: The endotracheal tube tip lies 5 cm above the crotch of the carina. The lungs are mildly hyperinflated. There is no alveolar infiltrate. There is minimal prominence of the interstitial markings bilaterally. The cardiopericardial silhouette is top-normal in size. The pulmonary vascularity is not engorged. There is no pleural effusion or pneumothorax. The observed portions of the bony thorax exhibit no acute abnormalities.  IMPRESSION: The endotracheal tube tip appears to be well-positioned. There is no evidence of pneumonia. Minimal prominence of the pulmonary interstitial markings is nonspecific.   Electronically Signed   By: David  [place]   On: 05/01/2013 17:12       ASSESSMENT / PLAN:  PULMONARY A:Likely AECOPD with acute resp failure. ? Due to virus P:   Permissive hypercapnia full vent support BD STeroids  CARDIOVASCULAR A: Snius tach  P:  Rule out MI Check bnp  RENAL A:  Moderate hyponatremia P:   Monitor If seizes or does not improve, consider 3% saline with CVL  GASTROINTESTINAL A:  Nil acute P:   ppi Npo ogt tube to lis  HEMATOLOGIC A:  leukocytosis P:  mmonitor  INFECTIOUS A:  AECOPD v UTI; clear CXR   Sepsis P:   PCT Lactate Cultures Vanc/ceftriazone/tamifly  ENDOCRINE A:  Hyperglycemia but ? DKA - soundsmore like AECOPD P:   ICU hyperglycemia protocol  NEUROLOGIC A:  Unresponsive/coma, likely due to aecopd/sepsis  P:   dioprivan Ct head Consider other causes if unimproved; at this time no LP  indication   TODAY'S SUMMARY:  12/26 - family updated. Full code      The patient is critically ill with multiple organ systems failure and requires high complexity decision making for assessment and support, frequent evaluation and titration of therapies, application of advanced monitoring technologies and extensive interpretation of multiple databases.   Critical Care Time devoted to patient care services described in this note is  45  Minutes.  Dr. Kalman Shan, M.D., [facility].C.P Pulmonary and Critical Care Medicine Staff Physician [facility] [place] [place]  Pager: [phone], If no answer or between  15:00h - 7:00h: call [phone]  05/01/2013 6:12 PM

## 2013-05-01 NOTE — ED Notes (Addendum)
Spoke with Pharmacy stated ordered to come from main pharmacy and sending now via tube system. Respiratory adjusted ET tube and Xray called for portable x-ray to be completed.

## 2013-05-01 NOTE — Progress Notes (Signed)
eLink Physician-Brief Progress Note Patient Name: Brenda Hogan DOB: [birth year] MRN: [id number]  Date of Service  05/01/2013   HPI/Events of Note  Mg 1.3 Pos trop   eICU Interventions  ASA 325, LBBB on ekg 4g Mag given Rpt ABG   Intervention Category Major Interventions: Acid-Base disturbance - evaluation and management Intermediate Interventions: Electrolyte abnormality - evaluation and management;Diagnostic test evaluation  [name] V. 05/01/2013, 11:52 PM

## 2013-05-01 NOTE — ED Notes (Signed)
EDP wanted blood cultures and urine to be sent prior to antibiotic start.  Immediately after temp foley placed and urine collect antibiotics started at 1725.

## 2013-05-01 NOTE — ED Notes (Signed)
After ET placement verified by ED Doctor after Respiratory adjusted ET tube will transport patient to CT then to floor.

## 2013-05-01 NOTE — ED Notes (Signed)
Advance OG 5cm and placed on intermittent suction.

## 2013-05-01 NOTE — Progress Notes (Signed)
Advanced tube to 23 per order

## 2013-05-01 NOTE — Progress Notes (Signed)
ANTIBIOTIC CONSULT NOTE - INITIAL  Pharmacy Consult for vancomycin + zosyn Indication: sepsis vs PNA vs UTI  Not on File  Patient Measurements: Height: 5' (152.4 cm) Weight: 200 lb (90.719 kg) IBW/kg (Calculated) : 45.5 Adjusted Body Weight:   Vital Signs: Temp: 99.6 F (37.6 C) (12/26 1915) Temp src: Rectal (12/26 1915) BP: 142/79 mmHg (12/26 1915) Pulse Rate: 127 (12/26 1915) Intake/Output from previous day:   Intake/Output from this shift:    Labs:  Recent Labs  05/01/13 1643  WBC 28.2*  HGB 14.3  PLT 436*  CREATININE 0.74   Estimated Creatinine Clearance: 60.1 ml/min (by C-G formula based on Cr of 0.74). No results found for this basename: VANCOTROUGH, VANCOPEAK, VANCORANDOM, GENTTROUGH, GENTPEAK, GENTRANDOM, TOBRATROUGH, TOBRAPEAK, TOBRARND, AMIKACINPEAK, AMIKACINTROU, AMIKACIN,  in the last 72 hours   Microbiology: No results found for this or any previous visit (from the past 720 hour(s)).  Medical History: No past medical history on file.  Medications:  Anti-infectives   Start     Dose/Rate Route Frequency Ordered Stop   05/02/13 0800  vancomycin (VANCOCIN) IVPB 750 mg/150 ml premix     750 mg 150 mL/hr over 60 Minutes Intravenous Every 12 hours 05/01/13 1932     05/02/13 0200  piperacillin-tazobactam (ZOSYN) IVPB 3.375 g     3.375 g 12.5 mL/hr over 240 Minutes Intravenous Every 8 hours 05/01/13 1932     05/01/13 2200  oseltamivir (TAMIFLU) capsule 75 mg     75 mg Oral 2 times daily 05/01/13 1829 05/06/13 2159   05/01/13 1845  piperacillin-tazobactam (ZOSYN) IVPB 3.375 g     3.375 g 100 mL/hr over 30 Minutes Intravenous  Once 05/01/13 1838     05/01/13 1845  vancomycin (VANCOCIN) IVPB 1000 mg/200 mL premix     1,000 mg 200 mL/hr over 60 Minutes Intravenous  Once 05/01/13 1838     05/01/13 1715  cefTRIAXone (ROCEPHIN) 2 g in dextrose 5 % 50 mL IVPB     2 g 100 mL/hr over 30 Minutes Intravenous  Once 05/01/13 1712 05/01/13 1750      Assessment: 78 yof presented to the ED with acute respiratory failure. To start empiric broad-spectrum antibiotics for pneumonia vs sepsis vs UTI. Tmax is 100.5 and WBC is elevated at 28.2. Scr WNL as 0.74.   Vanc 12/26>> Zosyn 12/26>> Tamiflu 12/26>>  Goal of Therapy:  Vancomycin trough level 15-20 mcg/ml  Plan:  1. Vancomycin 1gm IV x 1 then 750mg  IV Q12H 2. Zosyn 3.375gm IV x 1 over 30 minutes then 3.375gm IV Q8H (4 hr inf) 3. F/u renal fxn, C&S, clinical status and trough at [facility]  Angeliz Settlemyre, Drake Leach 05/01/2013,7:33 PM

## 2013-05-01 NOTE — ED Notes (Signed)
NOTIFIED DR. GOLDSTON OF PATIENTS LAB RESULTS OF CG4 LACTIC ACID = 2.95 mmoI/L ,@ 19;00PM ,12/26 /2014.

## 2013-05-02 ENCOUNTER — Inpatient Hospital Stay (HOSPITAL_COMMUNITY): Payer: Medicare Other

## 2013-05-02 ENCOUNTER — Other Ambulatory Visit: Payer: Self-pay

## 2013-05-02 DIAGNOSIS — J9600 Acute respiratory failure, unspecified whether with hypoxia or hypercapnia: Secondary | ICD-10-CM

## 2013-05-02 DIAGNOSIS — I214 Non-ST elevation (NSTEMI) myocardial infarction: Secondary | ICD-10-CM

## 2013-05-02 DIAGNOSIS — I471 Supraventricular tachycardia: Secondary | ICD-10-CM

## 2013-05-02 LAB — CBC
HCT: 39.6 % (ref 36.0–46.0)
MCHC: 32.3 g/dL (ref 30.0–36.0)
MCV: 88.8 fL (ref 78.0–100.0)
Platelets: 291 10*3/uL (ref 150–400)
RDW: 13.8 % (ref 11.5–15.5)

## 2013-05-02 LAB — BASIC METABOLIC PANEL
BUN: 19 mg/dL (ref 6–23)
Calcium: 7.4 mg/dL — ABNORMAL LOW (ref 8.4–10.5)
Creatinine, Ser: 0.75 mg/dL (ref 0.50–1.10)
GFR calc Af Amer: >90 mL/min (ref >90)
GFR calc non Af Amer: 80 mL/min — ABNORMAL LOW (ref >90)
Glucose, Bld: 272 mg/dL — ABNORMAL HIGH (ref 70–99)
Potassium: 4.7 mEq/L (ref 3.5–5.1)

## 2013-05-02 LAB — POCT I-STAT 3, ART BLOOD GAS (G3+)
Acid-base deficit: 4 mmol/L — ABNORMAL HIGH (ref 0.0–2.0)
Acid-base deficit: 3 mmol/L — ABNORMAL HIGH (ref 0.0–2.0)
Bicarbonate: 22.8 mEq/L (ref 20.0–24.0)
O2 Saturation: 99 %
O2 Saturation: 98 %
Patient temperature: 100.5
Patient temperature: 99.8
TCO2: 23 mmol/L (ref 0–100)
pCO2 arterial: 43.9 mmHg (ref 35.0–45.0)
pCO2 arterial: 42.6 mmHg (ref 35.0–45.0)
pH, Arterial: 7.34 — ABNORMAL LOW (ref 7.350–7.450)
pO2, Arterial: 115 mmHg — ABNORMAL HIGH (ref 80.0–100.0)

## 2013-05-02 LAB — TROPONIN I
Troponin I: 2.2 ng/mL (ref <0.30)
Troponin I: 2.02 ng/mL (ref <0.30)
Troponin I: 2.8 ng/mL (ref <0.30)
Troponin I: 1.8 ng/mL (ref <0.30)
Troponin I: 2.15 ng/mL (ref <0.30)

## 2013-05-02 LAB — INFLUENZA PANEL BY PCR (TYPE A & B)
H1N1 flu by pcr: NOT DETECTED
Influenza B By PCR: NEGATIVE

## 2013-05-02 LAB — GLUCOSE, CAPILLARY
Glucose-Capillary: 162 mg/dL — ABNORMAL HIGH (ref 70–99)
Glucose-Capillary: 168 mg/dL — ABNORMAL HIGH (ref 70–99)
Glucose-Capillary: 257 mg/dL — ABNORMAL HIGH (ref 70–99)
Glucose-Capillary: 259 mg/dL — ABNORMAL HIGH (ref 70–99)
Glucose-Capillary: 282 mg/dL — ABNORMAL HIGH (ref 70–99)
Glucose-Capillary: 304 mg/dL — ABNORMAL HIGH (ref 70–99)

## 2013-05-02 LAB — TRIGLYCERIDES: Triglycerides: 162 mg/dL — ABNORMAL HIGH (ref <150)

## 2013-05-02 LAB — MAGNESIUM: Magnesium: 3.4 mg/dL — ABNORMAL HIGH (ref 1.5–2.5)

## 2013-05-02 LAB — PROCALCITONIN: Procalcitonin: 2.35 ng/mL

## 2013-05-02 MED ORDER — LEVOFLOXACIN IN D5W 750 MG/150ML IV SOLN
750.0000 mg | INTRAVENOUS | Status: DC
  Administered 2013-05-02 – 2013-05-05 (×4): 750 mg via INTRAVENOUS
  Filled 2013-05-02 (×4): qty 150

## 2013-05-02 MED ORDER — ATORVASTATIN CALCIUM 40 MG PO TABS
40.0000 mg | ORAL_TABLET | Freq: Every day | ORAL | Status: DC
  Administered 2013-05-02 – 2013-05-13 (×11): 40 mg via ORAL
  Filled 2013-05-02 (×14): qty 1

## 2013-05-02 MED ORDER — PNEUMOCOCCAL VAC POLYVALENT 25 MCG/0.5ML IJ INJ
0.5000 mL | INJECTION | INTRAMUSCULAR | Status: DC
  Filled 2013-05-02: qty 0.5

## 2013-05-02 MED ORDER — OSELTAMIVIR PHOSPHATE 6 MG/ML PO SUSR
75.0000 mg | Freq: Two times a day (BID) | ORAL | Status: DC
  Administered 2013-05-02: 75 mg
  Filled 2013-05-02 (×3): qty 12.5

## 2013-05-02 MED ORDER — SODIUM CHLORIDE 0.9 % IV SOLN
INTRAVENOUS | Status: DC
  Administered 2013-05-02: 2.4 [IU]/h via INTRAVENOUS
  Filled 2013-05-02 (×2): qty 1

## 2013-05-02 MED ORDER — HEPARIN BOLUS VIA INFUSION
2000.0000 [IU] | Freq: Once | INTRAVENOUS | Status: AC
  Administered 2013-05-02: 2000 [IU] via INTRAVENOUS
  Filled 2013-05-02: qty 2000

## 2013-05-02 MED ORDER — FENTANYL CITRATE 0.05 MG/ML IJ SOLN
INTRAMUSCULAR | Status: AC
  Filled 2013-05-02: qty 2

## 2013-05-02 MED ORDER — HEPARIN (PORCINE) IN NACL 100-0.45 UNIT/ML-% IJ SOLN
1050.0000 [IU]/h | INTRAMUSCULAR | Status: DC
  Administered 2013-05-02: 850 [IU]/h via INTRAVENOUS
  Administered 2013-05-02 – 2013-05-03 (×2): 1050 [IU]/h via INTRAVENOUS
  Filled 2013-05-02 (×3): qty 250

## 2013-05-02 MED ORDER — VITAL HIGH PROTEIN PO LIQD
1000.0000 mL | ORAL | Status: DC
  Administered 2013-05-02: 1000 mL
  Filled 2013-05-02 (×3): qty 1000

## 2013-05-02 MED ORDER — CHLORHEXIDINE GLUCONATE 0.12 % MT SOLN
15.0000 mL | Freq: Two times a day (BID) | OROMUCOSAL | Status: DC
  Administered 2013-05-02 – 2013-05-07 (×11): 15 mL via OROMUCOSAL
  Filled 2013-05-02 (×12): qty 15

## 2013-05-02 MED ORDER — BIOTENE DRY MOUTH MT LIQD
15.0000 mL | Freq: Four times a day (QID) | OROMUCOSAL | Status: DC
  Administered 2013-05-02 – 2013-05-08 (×23): 15 mL via OROMUCOSAL

## 2013-05-02 NOTE — H&P (Signed)
PULMONARY  / CRITICAL CARE MEDICINE  Name: Brenda Hogan  MRN: [id number] DOB: 1934/05/30  ADMISSION DATE:  05/01/2013  CONSULTATION DATE:  05/01/2013   REFERRING MD :  EDP PRIMARY SERVICE: PCCM  CHIEF COMPLAINT:  Acute respiratory failure  BRIEF PATIENT DESCRIPTION: 78 yo with COPD, bed bound, found in respiratory distress, intubated by EMS. Troponin elevated upon arrival to ICU.  SIGNIFICANT EVENTS / STUDIES:  12/26  Head CT >>> neg 2023/05/22  TTE >>>  LINES / TUBES: OETT 12/26 >>> OGT 12/26 >>> Foley 12/26 >>>  CULTURES: 12/26 Blood >>> 12/26 Urine >>> 12/26 Trach aspirate >>> 12/26  Flu PCR >>> 12/26 Multoplex Virus PCR >>>  ANTIBIOTICS: Ceftriaxone 12/26 >>> Vancomycin 12/26 >>> Levaquin 2023-05-22 >>> Tamifly 12/26 >>>   INTERVAL HISTORY:  Troponin rising   VITAL SIGNS: Temp:  [98.5 F (36.9 C)-100.7 F (38.2 C)] 98.5 F (36.9 C) 05-22-2023 0800) Pulse Rate:  [52-140] 52 05/22/2023 0800) Cardiac Rhythm:  [-] Normal sinus rhythm 2023/05/22 0400) Resp:  [14-30] 24 2023-05-22 0800) BP: (94-156)/(32-99) 117/60 mmHg 2023/05/22 0800) SpO2:  [96 %-100 %] 96 % May 22, 2023 0800) FiO2 (%):  [40 %-100 %] 40 % May 22, 2023 0409) Weight:  [86.818 kg (191 lb 6.4 oz)-90.719 kg (200 lb)] 86.818 kg (191 lb 6.4 oz) (12/26 2200)  VENTILATOR SETTINGS: Vent Mode:  [-] PRVC FiO2 (%):  [30 %] 30 % Set Rate:  [28 bmp] 28 bmp Vt Set:  [420 mL] 420 mL PEEP:  [5 cmH20] 5 cmH20 Plateau Pressure:  [4 cmH20-21 cmH20] 20 cmH20  INTAKE / OUTPUT: Intake/Output     12/26 0701 - 05-22-23 0700 2023/05/22 0701 - 12/28 0700   I.V. (mL/kg) 497 (5.7)    IV Piggyback 150    Total Intake(mL/kg) 647 (7.5)    Urine (mL/kg/hr) 870    Total Output 870     Net -223             PHYSICAL EXAMINATION: General:  Appears acutely ill, mechanically ventilated, synchronous Neuro:  Encephalopathic, nonfocal, cough / gag diminished HEENT:  PERRL, OETT / OGT Cardiovascular:  RRR, no m/r/g Lungs:  Bilateral diminished air  entry, no w/r/r Abdomen:  Soft, nontender, bowel sounds diminished Musculoskeletal:  Moves all extremities, no edema Skin:  Intact  LABS: CBC  Recent Labs Lab 05/01/13 1643 05/01/13 2150 2013/05/21 0354  WBC 28.2* 21.3* 21.4*  HGB 14.3 12.7 12.8  HCT 44.0 40.0 39.6  PLT 436* 328 291   Coag's  Recent Labs Lab 05/01/13 1643 05/01/13 2150  APTT  --  30  INR 1.10 1.15   BMET  Recent Labs Lab 05/01/13 1643 05/01/13 2150 May 21, 2013 0354  NA 129* 130* 129*  K 5.0 4.5 4.7  CL 91* 96 97  CO2 23 22 20   BUN 15 16 19   CREATININE 0.74 0.69 0.75  GLUCOSE 385* 337* 272*   Electrolytes  Recent Labs Lab 05/01/13 1643 05/01/13 2150 2013/05/21 0354  CALCIUM 8.5 7.5* 7.4*  MG  --  1.3* 3.4*  PHOS  --  4.4 3.6   Sepsis Markers  Recent Labs Lab 05/01/13 1729 05/01/13 1831 05/01/13 2158  LATICACIDVEN 3.42* 2.95* 2.4*  PROCALCITON  --   --  2.35   ABG  Recent Labs Lab 05/01/13 1723 2013/05/21 0132 21-May-2013 0426  PHART 7.131* 7.317* 7.340*  PCO2ART 74.6* 43.9 42.6  PO2ART 462.0* 151.0* 115.0*   Liver Enzymes  Recent Labs Lab 05/01/13 1643 05/01/13 2150  AST 44* 55*  ALT 10 16  ALKPHOS 86 72  BILITOT 0.6 0.5  ALBUMIN 3.1* 2.6*   Cardiac Enzymes  Recent Labs Lab 05/01/13 1648 05/01/13 2158 05/02/13 0110  TROPONINI <0.30 1.30* 2.20*  PROBNP  --  3477.0*  --    Glucose  Recent Labs Lab 05/01/13 1645 05/01/13 2056 05/02/13 0002 05/02/13 0359 05/02/13 0714  GLUCAP 336* 323* 282* 257* 269*   CXR:  12/27 >>> Hardware in good position. No overt airspace disease  ASSESSMENT / PLAN:  PULMONARY A:  Acute respiratory failure. AECOPD. No overt pneumonia. P:   Goal pH>7.30, SpO2>92 Continuous mechanical support VAP bundle Daily SBT Trend ABG/CXR Albuterol / Atrovent Solu-Medrol 80 q8h  CARDIOVASCULAR A: NSTEMI. P:  Cardiology following, input appreciated ASA Add Heparin per Pharmacy x 48 h Add Lipitor Hold BB in setting of AECOPD May  add ACEI later TTE  RENAL A:  Hyponatremia. P:   Trend BMP NS@50   GASTROINTESTINAL A:  No active issues. P:   NPO Start TF per Nutrition Protonix for GI Px  HEMATOLOGIC A:  No active issues. P:  Trend CBC D/c Heparin [place] Heparin gtt for ACS  INFECTIOUS A:  Suspected pneumonia. P:   Cx / abx as above PCT Add Levaquin for atypical coverage  ENDOCRINE A:  Hyperglycemia. P:   D/c SSI Insulin IV  NEUROLOGIC A:  Acute encephalopathy. P:   Goal RASS 0 to 1 Propofol gtt Daily WUA  I have personally obtained history, examined patient, evaluated and interpreted laboratory and imaging results, reviewed medical records, formulated assessment / plan and placed orders.  CRITICAL CARE:  The patient is critically ill with multiple organ systems failure and requires high complexity decision making for assessment and support, frequent evaluation and titration of therapies, application of advanced monitoring technologies and extensive interpretation of multiple databases. Critical Care Time devoted to patient care services described in this note is 35 minutes.   Lonia Farber, MD Pulmonary and Critical Care Medicine [facility] Pager: [phone]  05/02/2013, 8:55 AM

## 2013-05-02 NOTE — Progress Notes (Signed)
Patient seen and agree with fellow note; admitted with COPD and noted to have elevated troponin and ECG changes; continue heparin, ASA and statin. Beta blocker on hold because of COPD; await echo; after she improves from COPD, will assess candidacy for further cardiac eval. QT is markedly prolonged; watch telemetry and supplement electrolytes. Olga Millers

## 2013-05-02 NOTE — Procedures (Signed)
Central Venous Catheter Insertion Procedure Note Yaslin Kirtley [id number] [birth year]  Procedure: Insertion of Central Venous Catheter Indications: Assessment of intravascular volume, Drug and/or fluid administration and Frequent blood sampling  Procedure Details Consent: Risks of procedure as well as the alternatives and risks of each were explained to the (patient/caregiver).  Consent for procedure obtained. Time Out: Verified patient identification, verified procedure, site/side was marked, verified correct patient position, special equipment/implants available, medications/allergies/relevent history reviewed, required imaging and test results available.  Performed  Maximum sterile technique was used including antiseptics, cap, gloves, gown, hand hygiene, mask and sheet. Skin prep: Chlorhexidine; local anesthetic administered A antimicrobial bonded/coated triple lumen catheter was placed in the left internal jugular vein using the Seldinger technique. Ultrasound guidance used.yes Catheter placed to 20 cm. Blood aspirated via all 3 ports and then flushed x 3. Line sutured x 2 and dressing applied.  Evaluation Blood flow good Complications: No apparent complications Patient did tolerate procedure well. Chest X-ray ordered to verify placement.  CXR: pending.  Brett Canales Allissa Albright ACNP [facility] PCCM Pager [phone] till 3 pm If no answer page [phone] 05/02/2013, 10:18 AM

## 2013-05-02 NOTE — Procedures (Signed)
Supervised, present through the entire procedure  Lonia Farber, MD Pulmonary and Critical Care Medicine [facility] Pager: [phone]

## 2013-05-02 NOTE — Progress Notes (Signed)
INITIAL NUTRITION ASSESSMENT  DOCUMENTATION CODES Per approved criteria  -Obesity Unspecified   INTERVENTION: Initiate Vital High Protein @ 20 ml/hr via OGT and increase by 10 ml every 4 hours to goal rate of 50 ml/hr. At goal rate, tube feeding regimen will provide 1200 kcal, 105 grams of protein, and 1003 ml of H2O.  TF regimen and propofol will provide 1372 kcal and 105 grams or protein which will meet 71% of estimated energy needs and 100% of estimated protein needs. RD to continue to monitor  NUTRITION DIAGNOSIS: Inadequate oral intake related to inability to eat as evidenced by NPO status.   Goal: Enteral nutrition to provide 60-70% of estimated calorie needs (22-25 kcals/kg ideal body weight) and 100% of estimated protein needs, based on ASPEN guidelines for permissive underfeeding in critically ill obese individuals.  Monitor:  Vent status TF initiation/tolerance Weight trends Labs  Reason for Assessment: New Vent/Consult for TF management  78 y.o. female  Admitting Dx: <principal problem not specified>  ASSESSMENT: Obese 78 yo female with COPD, bed bound, found in respiratory distress, intubated by EMS. RD consulted for tube feeding initiation and management. Pt remains intubated and sedated. Per RN plan to keep propofol running. Pt appears well-nourished. Skin appears dry.   Height: Ht Readings from Last 1 Encounters:  05/01/13 5\' 2"  (1.575 m)    Weight: Wt Readings from Last 1 Encounters:  05/01/13 191 lb 6.4 oz (86.818 kg)    Ideal Body Weight: 110 lbs  % Ideal Body Weight: 174%  Wt Readings from Last 10 Encounters:  05/01/13 191 lb 6.4 oz (86.818 kg)    Usual Body Weight: unknown  % Usual Body Weight: NA  BMI:  Body mass index is 35 kg/(m^2). (obese)  Patient is currently intubated on ventilator support.  MV: 15.9 L/min Temp (24hrs), Avg:99.8 F (37.7 C), Min:98.5 F (36.9 C), Max:100.7 F (38.2 C)  Propofol: 6.5 ml/hr (provides 172 kcal  per 24 hours)  Estimated Nutritional Needs: Kcal: 1924 (goal 1155-1347) Protein: 100-125 grams Fluid: 2.5 L/day  Skin: dry; intact  Diet Order: NPO  EDUCATION NEEDS: -No education needs identified at this time   Intake/Output Summary (Last 24 hours) at 05/02/13 1126 Last data filed at 05/02/13 1610  Gross per 24 hour  Intake  702.4 ml  Output    995 ml  Net -292.6 ml    Last BM: PTA   Labs:   Recent Labs Lab 05/01/13 1643 05/01/13 2150 05/02/13 0354  NA 129* 130* 129*  K 5.0 4.5 4.7  CL 91* 96 97  CO2 23 22 20   BUN 15 16 19   CREATININE 0.74 0.69 0.75  CALCIUM 8.5 7.5* 7.4*  MG  --  1.3* 3.4*  PHOS  --  4.4 3.6  GLUCOSE 385* 337* 272*    CBG (last 3)   Recent Labs  05/02/13 0002 05/02/13 0359 05/02/13 0714  GLUCAP 282* 257* 269*    Scheduled Meds: . ipratropium  0.5 mg Nebulization Q4H   And  . albuterol  2.5 mg Nebulization Q4H  . antiseptic oral rinse  15 mL Mouth Rinse QID  . aspirin  81 mg Oral Daily  . atorvastatin  40 mg Oral q1800  . chlorhexidine  15 mL Mouth Rinse BID  . fentaNYL      . levofloxacin (LEVAQUIN) IV  750 mg Intravenous Q24H  . methylPREDNISolone (SOLU-MEDROL) injection  80 mg Intravenous Q8H  . oseltamivir  75 mg Oral BID  . pantoprazole sodium  40 mg Per Tube Daily  . piperacillin-tazobactam (ZOSYN)  IV  3.375 g Intravenous Q8H  . [START ON 05/03/2013] pneumococcal 23 valent vaccine  0.5 mL Intramuscular Tomorrow-1000  . vancomycin  750 mg Intravenous Q12H    Continuous Infusions: . sodium chloride 50 mL/hr (05/01/13 1905)  . heparin    . insulin (NOVOLIN-R) infusion    . propofol 10 mcg/kg/min (05/02/13 0510)    No past medical history on file.  No past surgical history on file.  Ian Malkin RD, LDN Inpatient Clinical Dietitian Pager: [phone] After Hours Pager: [phone]

## 2013-05-02 NOTE — ED Provider Notes (Signed)
Medical screening examination/treatment/procedure(s) were conducted as a shared visit with non-physician practitioner(s) and myself.  I personally evaluated the patient during the encounter.  EKG Interpretation    Date/Time:  Friday May 01 2013 16:33:00 EST Ventricular Rate:  139 PR Interval:  83 QRS Duration: 113 QT Interval:  366 QTC Calculation: 557 R Axis:   -76 Text Interpretation:  Sinus or ectopic atrial tachycardia Left atrial enlargement Abnormal R-wave progression, late transition Left bundle branch block Prolonged QT interval Probable RV involvement, suggest recording right precordial leads No old tracing to compare Confirmed by Myson Levi  MD, Avrohom Mckelvin ([id number]) on 05/01/2013 6:06:09 PM            Patient with respiratory failure and depressed mental status. Intubated for airway protection. I was present for procedure and ultimately intubated the patient. Initially it was tried with 7.5 ETT, then 7.0, but only a 6.5 ETT would pass. Airway otherwise appeared normal, and a grade 1 view was obtained with direct laryngoscopy. Otherwise patient is tachycardic with low grade fever. Given her prior history, concern for urine source. Will also need broad abx coverage and likely flu coverage. Will get CT head, but at this time I feel her AMS was from acute hypercarbic respiratory failure. To ICU in critical condition.   Audree Camel, MD 05/02/13 0157

## 2013-05-02 NOTE — Progress Notes (Signed)
ANTICOAGULATION / ANTIBIOTIC CONSULT NOTE - Initial Consult  Pharmacy Consult:  Heparin + Levaquin Indication:  ACS + possible PNA / UTI / COPD exac  Not on File  Patient Measurements: Height: 5\' 2"  (157.5 cm) Weight: 191 lb 6.4 oz (86.818 kg) IBW/kg (Calculated) : 50.1 Heparin Dosing Weight: 70 kg  Vital Signs: Temp: 98.5 F (36.9 C) (12/27 0800) BP: 117/60 mmHg (12/27 0800) Pulse Rate: 52 (12/27 0800)  Labs:  Recent Labs  05/01/13 1643 05/01/13 1648 05/01/13 2150 05/01/13 2158 05/02/13 0110 05/02/13 0354  HGB 14.3  --  12.7  --   --  12.8  HCT 44.0  --  40.0  --   --  39.6  PLT 436*  --  328  --   --  291  APTT  --   --  30  --   --   --   LABPROT 14.0  --  14.5  --   --   --   INR 1.10  --  1.15  --   --   --   CREATININE 0.74  --  0.69  --   --  0.75  CKTOTAL 129  --   --   --   --   --   TROPONINI  --  <0.30  --  1.30* 2.20*  --     Estimated Creatinine Clearance: 61.2 ml/min (by C-G formula based on Cr of 0.75).   Medical History: No past medical history on file.    Assessment: 36 YOF who is bed-bound was found in respiratory distress and intubated by EMS.  Patient has +CE and Pharmacy consulted to manage IV heparin.  Noted patient received heparin SQ this AM.  Baseline labs reviewed.  Patient was started on vancomycin, Zosyn and Tamiflu upon admission for COPD exacerbation, PNA, possible UTI and rule out flu.  Now to add Levaquin.  Her renal function is stable.  Will be cautious with interpreting CrCL as patient is bed-bound.  Vanc 12/26 >> Zosyn 12/26 >> Tamiflu 12/26 >> LVQ 12/27 >>  12/26 blood cx x2 - collected 12/26 resp cx - collected 12/26 urine cx - collected 12/26 MRSA PCR - negative   Goal of Therapy:  Heparin level 0.3-0.7 units/ml Vanc trough: 15-20 mcg/mL Monitor platelets by anticoagulation protocol: Yes    Plan:  - D/C SQ heparin - Heparin gtt at 850 units/hr, no bolus - Check 8 hr HL - Daily HL / CBC - LVQ 750mg  IV  Q24H - Vanc 750mg  IV Q12H - Zosyn 3.375gm IV Q8H, 4 hr infusion - F/u renal fxn, C&S, clinical status and trough at [facility] D. Laney Potash, PharmD, BCPS Pager:  [phone] 05/02/2013, 9:03 AM

## 2013-05-02 NOTE — Progress Notes (Signed)
ANTICOAGULATION CONSULT NOTE - Follow Up Consult  Pharmacy Consult for heparin Indication: chest pain/ACS  No Known Allergies  Patient Measurements: Height: 5\' 2"  (157.5 cm) Weight: 191 lb 6.4 oz (86.818 kg) IBW/kg (Calculated) : 50.1 Heparin Dosing Weight: 70 kg  Vital Signs: Temp: 99.2 F (37.3 C) (12/27 1815) BP: 129/109 mmHg (12/27 1800) Pulse Rate: 98 (12/27 1815)  Labs:  Recent Labs  05/01/13 1643  05/01/13 2150  05/02/13 0354 05/02/13 0820 05/02/13 1025 05/02/13 1450 05/02/13 1805  HGB 14.3  --  12.7  --  12.8  --   --   --   --   HCT 44.0  --  40.0  --  39.6  --   --   --   --   PLT 436*  --  328  --  291  --   --   --   --   APTT  --   --  30  --   --   --   --   --   --   LABPROT 14.0  --  14.5  --   --   --   --   --   --   INR 1.10  --  1.15  --   --   --   --   --   --   HEPARINUNFRC  --   --   --   --   --   --   --   --  <0.10*  CREATININE 0.74  --  0.69  --  0.75  --   --   --   --   CKTOTAL 129  --   --   --   --   --   --   --   --   TROPONINI  --   < >  --   < >  --  2.02* 2.80* 1.80*  --   < > = values in this interval not displayed.  Estimated Creatinine Clearance: 61.2 ml/min (by C-G formula based on Cr of 0.75).   Assessment: Patient is a 78 y.o M on heparin for ACS.  First heparin level now back undetectable.  Per RN, no issues with IV line and no bleeding noted.  Goal of Therapy:  Heparin level 0.3-0.7 units/ml Monitor platelets by anticoagulation protocol: Yes   Plan:  1) heparin 2000 units IV bolus, then increase drip to 1050 units/hr 2) recheck another 8 hour heparin level  Nikki Glanzer P 05/02/2013,7:33 PM

## 2013-05-02 NOTE — Consult Note (Signed)
CARDIOLOGY CONSULT NOTE   Patient ID: Brenda Hogan MRN: [id number], DOB/AGE: 07/17/1934   Admit date: 05/01/2013 Date of Consult: 05/02/2013   Primary Physician: Hyman Hopes, MD Primary Cardiologist: None  Pt. Profile 78F with COPD, severe OA (bedbound), no know CV history is admitted to MICU intubated for COPD exacerbation. Cardiology is consulted for ECG changes and positive troponin  Problem List  No past medical history on file.  No past surgical history on file.   Allergies  Not on File  HPI   78F with COPD, severe OA (bedbound), no know CV history is admitted to MICU intubated for COPD exacerbation. This AM developed progressive respiratory symptoms and ultimately became somnolent and cynanotic.  (+) sick contacts. She [facility] ED where she was intubated for hypercarbic respiratory failure.   Cardiology is consulted for ECG changes and positive troponin. Patient is intubated and sedated so history is obtained from the chart.   Inpatient Medications  . ipratropium  0.5 mg Nebulization Q4H   And  . albuterol  2.5 mg Nebulization Q4H  . antiseptic oral rinse  15 mL Mouth Rinse QID  . aspirin  81 mg Oral Daily  . chlorhexidine  15 mL Mouth Rinse BID  . fentaNYL      . heparin  5,000 Units Subcutaneous Q8H  . insulin aspart  0-20 Units Subcutaneous Q4H  . insulin glargine  10 Units Subcutaneous QHS  . lidocaine (cardiac) 100 mg/70ml      . methylPREDNISolone (SOLU-MEDROL) injection  80 mg Intravenous Q8H  . oseltamivir  75 mg Oral BID  . pantoprazole sodium  40 mg Per Tube Daily  . piperacillin-tazobactam (ZOSYN)  IV  3.375 g Intravenous Q8H  . succinylcholine      . vancomycin  750 mg Intravenous Q12H    Family History No family history on file.   Social History History   Social History  . Marital Status: Divorced    Spouse Name: N/A    Number of Children: N/A  . Years of Education: N/A   Occupational History  . Not on file.    Social History Main Topics  . Smoking status: Not on file  . Smokeless tobacco: Not on file  . Alcohol Use: Not on file  . Drug Use: Not on file  . Sexual Activity: Not on file   Other Topics Concern  . Not on file   Social History Narrative  . No narrative on file     Review of Systems Unable to perform  Physical Exam  Blood pressure 104/62, pulse 91, temperature 100 F (37.8 C), temperature source Rectal, resp. rate 24, height 5\' 2"  (1.575 m), weight 86.818 kg (191 lb 6.4 oz), SpO2 100.00%.  General: Intubated, sedated Psych: sedated Neuro: sedated HEENT: ETT  Neck: No JVD, accounting for the effects of PPV Lungs:  Coarse anteriorly Heart: RRR no s3, s4, or murmurs. Abdomen: Soft, non-tender, obese, BS +  Extremities: No clubbing, cyanosis or edema. DP 2+ equal bilaterally. Extremities cool.   Labs   Recent Labs  05/01/13 1643 05/01/13 1648 05/01/13 2158 05/02/13 0110  CKTOTAL 129  --   --   --   TROPONINI  --  <0.30 1.30* 2.20*   Lab Results  Component Value Date   WBC 21.3* 05/01/2013   HGB 12.7 05/01/2013   HCT 40.0 05/01/2013   MCV 88.9 05/01/2013   PLT 328 05/01/2013    Recent Labs Lab 05/01/13 2150  NA 130*  K 4.5  CL 96  CO2 22  BUN 16  CREATININE 0.69  CALCIUM 7.5*  PROT 7.1  BILITOT 0.5  ALKPHOS 72  ALT 16  AST 55*  GLUCOSE 337*   Lab Results  Component Value Date   TRIG 162* 05/01/2013   Lab Results  Component Value Date   DDIMER 1.26* 05/01/2013    Radiology/Studies  Ct Head Wo Contrast  05/01/2013   CLINICAL DATA:  Altered mental status.  EXAM: CT HEAD WITHOUT CONTRAST  TECHNIQUE: Contiguous axial images were obtained from the base of the skull through the vertex without intravenous contrast.  COMPARISON:  None.  FINDINGS: Ventricles are normal in configuration. There is ventricular and sulcal enlargement reflecting mild to moderate atrophy.  No parenchymal masses or mass effect. No evidence of a cortical infarct.  Patchy white matter hypoattenuation is noted most consistent with moderate chronic microvascular ischemic change.  No extra-axial masses or abnormal fluid collections.  There is no intracranial hemorrhage.  Visualized sinuses and mastoid air cells are clear.  IMPRESSION: No acute intracranial abnormalities.  Mild to moderate atrophy. Moderate chronic microvascular ischemic change.   Electronically Signed   By: Amie Portland M.D.   On: 05/01/2013 20:54   Dg Chest Port 1 View  05/01/2013   CLINICAL DATA:  Evaluate endotracheal tube position.  EXAM: PORTABLE CHEST - 1 VIEW  COMPARISON:  05/01/2013 at 1651 hr  FINDINGS: Endotracheal tube tip now measures 4.6 cm above the carina.  New nasogastric tube passes below the diaphragm below the included field of view.  Cardiac silhouette is mildly enlarged. No mediastinal or hilar masses. Clear lungs.  IMPRESSION: Endotracheal tube tip now lies 4.6 cm above the carina. Nasogastric tube passes below the diaphragm. No other change from the prior study.   Electronically Signed   By: Amie Portland M.D.   On: 05/01/2013 20:26   Dg Chest Portable 1 View  05/01/2013   CLINICAL DATA:  Assess endotracheal tube position.  EXAM: PORTABLE CHEST - 1 VIEW  COMPARISON:  None.  FINDINGS: The endotracheal tube tip lies 5 cm above the crotch of the carina. The lungs are mildly hyperinflated. There is no alveolar infiltrate. There is minimal prominence of the interstitial markings bilaterally. The cardiopericardial silhouette is top-normal in size. The pulmonary vascularity is not engorged. There is no pleural effusion or pneumothorax. The observed portions of the bony thorax exhibit no acute abnormalities.  IMPRESSION: The endotracheal tube tip appears to be well-positioned. There is no evidence of pneumonia. Minimal prominence of the pulmonary interstitial markings is nonspecific.   Electronically Signed   By: David  [place]   On: 05/01/2013 17:12   Dg Abd Portable 1v  05/01/2013    CLINICAL DATA:  Nasogastric tube placement.  EXAM: PORTABLE ABDOMEN - 1 VIEW  COMPARISON:  None.  FINDINGS: Nasogastric tube passes below the diaphragm into the proximal stomach. Consider inserting 5-10 additional cm to allow the tip to into the distal stomach.  Normal bowel gas pattern.  Clear lung bases.  IMPRESSION: Nasogastric tube tip is in the proximal stomach.   Electronically Signed   By: Amie Portland M.D.   On: 05/01/2013 19:10    ECG 05/01/13: narrow complex tachycardia, LAD, LVH, no R wave progression 05/02/2013: NSR, TWI in lateral leads, ASMI  ASSESSMENT 1. Severe COPD exacerbation  2. Lactic acidosis 3. NSTEMI, query type II MI given evidence of systemic supply demand mismatch as evidenced by lactic acidosis 4. Rate related conduction delay  Recommendation 1. TTE in AM 2. ASA/statin/heparin 3. Can hold BB given COPD exacerbation 4. PCI will unlikely have a role if Ms. Brodrick is as debilitated as it seems.  5. Continue to cycle enzymes until TnI peaks    Signed, Glori Luis, MD 05/02/2013, 4:11 AM

## 2013-05-03 DIAGNOSIS — I059 Rheumatic mitral valve disease, unspecified: Secondary | ICD-10-CM

## 2013-05-03 LAB — CBC
HCT: 34.6 % — ABNORMAL LOW (ref 36.0–46.0)
MCH: 28.2 pg (ref 26.0–34.0)
MCHC: 32.4 g/dL (ref 30.0–36.0)
MCV: 87.2 fL (ref 78.0–100.0)
Platelets: 303 10*3/uL (ref 150–400)
RBC: 3.97 MIL/uL (ref 3.87–5.11)
RDW: 13.8 % (ref 11.5–15.5)
WBC: 23.4 10*3/uL — ABNORMAL HIGH (ref 4.0–10.5)

## 2013-05-03 LAB — URINE CULTURE
Colony Count: NO GROWTH
Colony Count: NO GROWTH
Culture: NO GROWTH
Culture: NO GROWTH

## 2013-05-03 LAB — GLUCOSE, CAPILLARY
Glucose-Capillary: 174 mg/dL — ABNORMAL HIGH (ref 70–99)
Glucose-Capillary: 150 mg/dL — ABNORMAL HIGH (ref 70–99)
Glucose-Capillary: 143 mg/dL — ABNORMAL HIGH (ref 70–99)
Glucose-Capillary: 279 mg/dL — ABNORMAL HIGH (ref 70–99)
Glucose-Capillary: 234 mg/dL — ABNORMAL HIGH (ref 70–99)

## 2013-05-03 LAB — RESPIRATORY VIRUS PANEL
Influenza A H1: NOT DETECTED
Influenza A H3: DETECTED — AB
Metapneumovirus: NOT DETECTED
Parainfluenza 1: NOT DETECTED
Parainfluenza 2: NOT DETECTED

## 2013-05-03 LAB — LEGIONELLA ANTIGEN, URINE

## 2013-05-03 LAB — HEPARIN LEVEL (UNFRACTIONATED): Heparin Unfractionated: 0.34 IU/mL (ref 0.30–0.70)

## 2013-05-03 MED ORDER — ALBUTEROL SULFATE (5 MG/ML) 0.5% IN NEBU
INHALATION_SOLUTION | RESPIRATORY_TRACT | Status: AC
  Filled 2013-05-03: qty 0.5

## 2013-05-03 MED ORDER — METOPROLOL TARTRATE 1 MG/ML IV SOLN
5.0000 mg | Freq: Four times a day (QID) | INTRAVENOUS | Status: DC
  Administered 2013-05-03 – 2013-05-08 (×20): 5 mg via INTRAVENOUS
  Filled 2013-05-03 (×29): qty 5

## 2013-05-03 MED ORDER — LEVALBUTEROL HCL 1.25 MG/0.5ML IN NEBU
1.2500 mg | INHALATION_SOLUTION | Freq: Once | RESPIRATORY_TRACT | Status: AC
  Administered 2013-05-03: 1.25 mg via RESPIRATORY_TRACT

## 2013-05-03 MED ORDER — LEVALBUTEROL HCL 1.25 MG/0.5ML IN NEBU
1.2500 mg | INHALATION_SOLUTION | RESPIRATORY_TRACT | Status: DC
  Administered 2013-05-03 – 2013-05-06 (×19): 1.25 mg via RESPIRATORY_TRACT
  Filled 2013-05-03 (×25): qty 0.5

## 2013-05-03 MED ORDER — METOPROLOL TARTRATE 1 MG/ML IV SOLN
INTRAVENOUS | Status: AC
  Filled 2013-05-03: qty 5

## 2013-05-03 MED ORDER — INSULIN ASPART 100 UNIT/ML ~~LOC~~ SOLN
0.0000 [IU] | SUBCUTANEOUS | Status: DC
  Administered 2013-05-03: 2 [IU] via SUBCUTANEOUS
  Administered 2013-05-03: 5 [IU] via SUBCUTANEOUS
  Administered 2013-05-03 – 2013-05-04 (×4): 8 [IU] via SUBCUTANEOUS
  Administered 2013-05-04: 5 [IU] via SUBCUTANEOUS
  Administered 2013-05-04: 8 [IU] via SUBCUTANEOUS
  Administered 2013-05-04: 5 [IU] via SUBCUTANEOUS
  Administered 2013-05-04: 3 [IU] via SUBCUTANEOUS
  Administered 2013-05-04: 8 [IU] via SUBCUTANEOUS
  Administered 2013-05-05 (×5): 3 [IU] via SUBCUTANEOUS
  Administered 2013-05-06 (×2): 11 [IU] via SUBCUTANEOUS
  Administered 2013-05-06 (×2): 5 [IU] via SUBCUTANEOUS
  Administered 2013-05-06: 8 [IU] via SUBCUTANEOUS
  Administered 2013-05-07 (×2): 2 [IU] via SUBCUTANEOUS
  Administered 2013-05-07: 5 [IU] via SUBCUTANEOUS
  Administered 2013-05-07: 2 [IU] via SUBCUTANEOUS
  Administered 2013-05-07: 5 [IU] via SUBCUTANEOUS
  Administered 2013-05-07: 8 [IU] via SUBCUTANEOUS

## 2013-05-03 MED ORDER — ASPIRIN 81 MG PO CHEW
81.0000 mg | CHEWABLE_TABLET | Freq: Every day | ORAL | Status: DC
  Administered 2013-05-03 – 2013-05-13 (×11): 81 mg via ORAL
  Filled 2013-05-03 (×10): qty 1

## 2013-05-03 MED ORDER — INSULIN GLARGINE 100 UNIT/ML ~~LOC~~ SOLN
15.0000 [IU] | Freq: Every day | SUBCUTANEOUS | Status: DC
  Administered 2013-05-03 – 2013-05-04 (×2): 15 [IU] via SUBCUTANEOUS
  Filled 2013-05-03 (×2): qty 0.15

## 2013-05-03 MED ORDER — OSELTAMIVIR PHOSPHATE 75 MG PO CAPS
75.0000 mg | ORAL_CAPSULE | Freq: Two times a day (BID) | ORAL | Status: DC
  Administered 2013-05-03 – 2013-05-04 (×4): 75 mg via ORAL
  Filled 2013-05-03 (×6): qty 1

## 2013-05-03 MED ORDER — METOPROLOL TARTRATE 1 MG/ML IV SOLN
5.0000 mg | Freq: Once | INTRAVENOUS | Status: AC
  Administered 2013-05-03: 5 mg via INTRAVENOUS

## 2013-05-03 MED ORDER — INSULIN GLARGINE 100 UNIT/ML ~~LOC~~ SOLN
15.0000 [IU] | Freq: Every day | SUBCUTANEOUS | Status: DC
  Filled 2013-05-03: qty 0.15

## 2013-05-03 NOTE — H&P (Signed)
PULMONARY  / CRITICAL CARE MEDICINE  Name: Brenda Hogan  MRN: [id number] DOB: 30-Mar-1935  ADMISSION DATE:  05/01/2013 CONSULTATION DATE:  05/01/2013  REFERRING MD :  EDP PRIMARY SERVICE: PCCM  CHIEF COMPLAINT:  Acute respiratory failure  BRIEF PATIENT DESCRIPTION: 78 yo with COPD, bed bound, found in respiratory distress, intubated by EMS. Troponin elevated upon arrival to ICU.  SIGNIFICANT EVENTS / STUDIES:  12/26  Head CT >>> neg May 10, 2023  TTE >>>  LINES / TUBES: OETT 12/26 >>> 12/28 OGT 12/26 >>> 12/28 Foley 12/26 >>>  CULTURES: 12/26  MRSA >>> neg 12/26  Blood >>> 12/26  Urine >>> 12/26 Trach aspirate >>> 12/26  Flu PCR >>> 12/26  Multoplex Virus PCR >>>  ANTIBIOTICS: Ceftriaxone 12/26 >>> Vancomycin 12/26 >>> 12/28 Levaquin 05-10-23 >>> Tamifly 12/26 >>>   INTERVAL HISTORY:  Tolerating SBT well today.  VITAL SIGNS: Temp:  [97.6 F (36.4 C)-99.5 F (37.5 C)] 98.2 F (36.8 C) (12/28 0834) Pulse Rate:  [83-108] 101 (12/28 0834) Cardiac Rhythm:  [-] Normal sinus rhythm (12/28 0800) Resp:  [10-25] 19 (12/28 0834) BP: (98-160)/(41-113) 137/113 mmHg (12/28 0800) SpO2:  [99 %-100 %] 100 % (12/28 0834) FiO2 (%):  [40 %] 40 % (12/28 0818) Weight:  [88.1 kg (194 lb 3.6 oz)] 88.1 kg (194 lb 3.6 oz) (12/28 0438)  VENTILATOR SETTINGS: Vent Mode:  [-] PRVC FiO2 (%):  [30 %] 30 % Set Rate:  [28 bmp] 28 bmp Vt Set:  [420 mL] 420 mL PEEP:  [5 cmH20] 5 cmH20 Plateau Pressure:  [4 cmH20-21 cmH20] 20 cmH20  INTAKE / OUTPUT: Intake/Output     May 10, 2023 0701 - 12/28 0700 12/28 0701 - 12/29 0700   I.V. (mL/kg) 1573.2 (17.9) 66.1 (0.8)   NG/GT 443 30   IV Piggyback 600 150   Total Intake(mL/kg) 2616.2 (29.7) 246.1 (2.8)   Urine (mL/kg/hr) 1855 (0.9) 100 (0.6)   Total Output 1855 100   Net +761.2 +146.1           PHYSICAL EXAMINATION: General:  Appears acutely ill, mechanically ventilated, synchronous Neuro:  Awake, trying to verbalize HEENT:  PERRL, OETT /  OGT Cardiovascular:  Tachycardic, regular Lungs:  Bilateral rhonchi / wheezes Abdomen:  Soft, nontender, bowel sounds diminished Musculoskeletal:  Moves all extremities, no edema Skin:  Intact  LABS: CBC  Recent Labs Lab 05/01/13 2150 2013/05/09 0354 05/03/13 0413  WBC 21.3* 21.4* 23.4*  HGB 12.7 12.8 11.2*  HCT 40.0 39.6 34.6*  PLT 328 291 303   Coag's  Recent Labs Lab 05/01/13 1643 05/01/13 2150  APTT  --  30  INR 1.10 1.15   BMET  Recent Labs Lab 05/01/13 1643 05/01/13 2150 2013/05/09 0354  NA 129* 130* 129*  K 5.0 4.5 4.7  CL 91* 96 97  CO2 23 22 20   BUN 15 16 19   CREATININE 0.74 0.69 0.75  GLUCOSE 385* 337* 272*   Electrolytes  Recent Labs Lab 05/01/13 1643 05/01/13 2150 May 09, 2013 0354  CALCIUM 8.5 7.5* 7.4*  MG  --  1.3* 3.4*  PHOS  --  4.4 3.6   Sepsis Markers  Recent Labs Lab 05/01/13 1729 05/01/13 1831 05/01/13 2158  LATICACIDVEN 3.42* 2.95* 2.4*  PROCALCITON  --   --  2.35   ABG  Recent Labs Lab 05/01/13 1723 05-09-2013 0132 2013/05/09 0426  PHART 7.131* 7.317* 7.340*  PCO2ART 74.6* 43.9 42.6  PO2ART 462.0* 151.0* 115.0*   Liver Enzymes  Recent Labs Lab 05/01/13 1643 05/01/13 2150  AST 44* 55*  ALT 10 16  ALKPHOS 86 72  BILITOT 0.6 0.5  ALBUMIN 3.1* 2.6*   Cardiac Enzymes  Recent Labs Lab 05/01/13 1648 05/01/13 2158  05/02/13 1025 05/02/13 1450 05/02/13 2126  TROPONINI <0.30 1.30*  < > 2.80* 1.80* 2.15*  PROBNP  --  3477.0*  --   --   --   --   < > = values in this interval not displayed. Glucose  Recent Labs Lab 05/02/13 2346 05/03/13 0056 05/03/13 0206 05/03/13 0305 05/03/13 0411 05/03/13 0718  GLUCAP 164* 161* 150* 145* 143* 277*   CXR:  None today  ASSESSMENT / PLAN:  PULMONARY A:  Acute respiratory failure. AECOPD. No overt pneumonia. P:   Extubate Goal SpO2>92 Supplemental oxygen Change bronchodilators to Xopenex / Atrovent Solu-Medrol 80 q8h  CARDIOVASCULAR A: NSTEMI. P:  Cardiology  following ASA, Lipitor Heparin per Pharmacy x 48 h Metoprolol 5 q6h, convert to oral once able to tolerate diet May add ACEI later TTE  RENAL A:  Hyponatremia. P:   Trend BMP NS@50   GASTROINTESTINAL A:  No active issues. P:   NPO D/c TF D/c Protonix as extubated and is not on vasopressors Diet later today  HEMATOLOGIC A:  No active issues. P:  Trend CBC Heparin gtt for ACS x 48 hours  INFECTIOUS A:  Suspected pneumonia ( PCT 2.35). P:   Cx / abx as above D/c Vancomycin  ENDOCRINE A:  Hyperglycemia. P:   SSI Lantus 15  NEUROLOGIC A:  Acute encephalopathy - resolved P:   D/c Propofol gtt  I have personally obtained history, examined patient, evaluated and interpreted laboratory and imaging results, reviewed medical records, formulated assessment / plan and placed orders.  CRITICAL CARE:  The patient is critically ill with multiple organ systems failure and requires high complexity decision making for assessment and support, frequent evaluation and titration of therapies, application of advanced monitoring technologies and extensive interpretation of multiple databases. Critical Care Time devoted to patient care services described in this note is 35 minutes.   Lonia Farber, MD Pulmonary and Critical Care Medicine [facility] Pager: [phone]  05/03/2013, 8:48 AM

## 2013-05-03 NOTE — Progress Notes (Signed)
    Subjective:  Denies CP; complains of dyspnea   Objective:  Filed Vitals:   05/03/13 0700 05/03/13 0800 05/03/13 0834 05/03/13 0900  BP: 133/78 137/113  183/102  Pulse: 94 98 101 126  Temp: 97.7 F (36.5 C) 98 F (36.7 C) 98.2 F (36.8 C) 98.5 F (36.9 C)  TempSrc:      Resp: 10 24 19 27   Height:      Weight:      SpO2: 100% 100% 100% 99%    Intake/Output from previous day:  Intake/Output Summary (Last 24 hours) at 05/03/13 1018 Last data filed at 05/03/13 0900  Gross per 24 hour  Intake 2757.14 ml  Output   1505 ml  Net 1252.14 ml    Physical Exam: Physical exam: Well-developed obese in no acute distress.  Skin is warm and dry.  HEENT is normal.  Neck is supple.  Chest with diffuse rhonchi and mild expiratory wheeze Cardiovascular exam is regular rate and rhythm.  Abdominal exam nontender or distended. No masses palpated. Extremities show no edema. neuro grossly intact    Lab Results: Basic Metabolic Panel:  Recent Labs  41/32/44 2150 05/02/13 0354  NA 130* 129*  K 4.5 4.7  CL 96 97  CO2 22 20  GLUCOSE 337* 272*  BUN 16 19  CREATININE 0.69 0.75  CALCIUM 7.5* 7.4*  MG 1.3* 3.4*  PHOS 4.4 3.6   CBC:  Recent Labs  05/01/13 1643 05/01/13 2150 05/02/13 0354 05/03/13 0413  WBC 28.2* 21.3* 21.4* 23.4*  NEUTROABS 19.2* 18.2*  --   --   HGB 14.3 12.7 12.8 11.2*  HCT 44.0 40.0 39.6 34.6*  MCV 90.0 88.9 88.8 87.2  PLT 436* 328 291 303   Cardiac Enzymes:  Recent Labs  05/01/13 1643  05/02/13 1025 05/02/13 1450 05/02/13 2126  CKTOTAL 129  --   --   --   --   TROPONINI  --   < > 2.80* 1.80* 2.15*  < > = values in this interval not displayed.   Assessment/Plan:  1 non-ST elevation myocardial infarction-patient denies chest pain. Her troponin is elevated. She has multiple risk factors including long history of diabetes mellitus. Plan continue aspirin, heparin, statin and beta blocker. Patient relates to me that she is able to care for  herself and ambulate occasionally. She will need an ischemia evaluation prior to discharge. Given multiple comorbidities I would favor nuclear study and medical therapy if low risk. If high risk she would require cardiac catheterization. She needs to recover from her viral illness first as well as improve from a COPD standpoint prior to proceeding. Repeat electrocardiogram tomorrow morning. Await echocardiogram. 2 COPD-management per critical care medicine. 3 pneumonia-continue antibiotics.  Olga Millers 05/03/2013, 10:18 AM

## 2013-05-03 NOTE — Progress Notes (Signed)
ANTICOAGULATION CONSULT NOTE - Follow Up Consult  Pharmacy Consult for Heparin Indication: chest pain/ACS  No Known Allergies  Patient Measurements: Height: 5\' 2"  (157.5 cm) Weight: 194 lb 3.6 oz (88.1 kg) IBW/kg (Calculated) : 50.1 Heparin Dosing Weight: ~70 kg  Vital Signs: Temp: 98.8 F (37.1 C) (12/28 1500) BP: 159/76 mmHg (12/28 1500) Pulse Rate: 86 (12/28 1500)  Labs:  Recent Labs  05/01/13 1643  05/01/13 2150  05/02/13 0354  05/02/13 1025 05/02/13 1450 05/02/13 1805 05/02/13 2126 05/03/13 0413 05/03/13 0445 05/03/13 1330  HGB 14.3  --  12.7  --  12.8  --   --   --   --   --  11.2*  --   --   HCT 44.0  --  40.0  --  39.6  --   --   --   --   --  34.6*  --   --   PLT 436*  --  328  --  291  --   --   --   --   --  303  --   --   APTT  --   --  30  --   --   --   --   --   --   --   --   --   --   LABPROT 14.0  --  14.5  --   --   --   --   --   --   --   --   --   --   INR 1.10  --  1.15  --   --   --   --   --   --   --   --   --   --   HEPARINUNFRC  --   --   --   --   --   --   --   --  <0.10*  --   --  0.34 0.35  CREATININE 0.74  --  0.69  --  0.75  --   --   --   --   --   --   --   --   CKTOTAL 129  --   --   --   --   --   --   --   --   --   --   --   --   TROPONINI  --   < >  --   < >  --   < > 2.80* 1.80*  --  2.15*  --   --   --   < > = values in this interval not displayed.  Estimated Creatinine Clearance: 61.7 ml/min (by C-G formula based on Cr of 0.75).   Medications:  Infusions:  . sodium chloride 50 mL/hr at 05/03/13 0042  . heparin 1,050 Units/hr (05/03/13 1154)    Assessment: 78 year old female receiving anticoagulation with Heparin for ACS.  Her heparin level remains therapeutic.  Note her hemoglobin has trended down slightly today.  Her platelet count is stable.  No bleeding noted.  Goal of Therapy:  Heparin level 0.3-0.7 units/ml Monitor platelets by anticoagulation protocol: Yes   Plan:  Continue Heparin at 1050 units/hr Daily  Heparin level and CBC  Estella Husk, Pharm.D., BCPS, AAHIVP Clinical Pharmacist Phone: [phone] or [phone] 05/03/2013, 3:23 PM

## 2013-05-03 NOTE — Progress Notes (Signed)
Echo Lab  2D Echocardiogram completed.  Linell Meldrum L Joana Nolton, RDCS 05/03/2013 9:50 AM

## 2013-05-03 NOTE — Progress Notes (Signed)
ANTICOAGULATION CONSULT NOTE - Follow Up Consult  Pharmacy Consult for heparin Indication: chest pain/ACS  No Known Allergies  Patient Measurements: Height: 5\' 2"  (157.5 cm) Weight: 194 lb 3.6 oz (88.1 kg) IBW/kg (Calculated) : 50.1 Heparin Dosing Weight: 70 kg  Vital Signs: Temp: 97.6 F (36.4 C) (12/28 0500) BP: 135/66 mmHg (12/28 0500) Pulse Rate: 102 (12/28 0500)  Labs:  Recent Labs  05/01/13 1643  05/01/13 2150  05/02/13 0354  05/02/13 1025 05/02/13 1450 05/02/13 1805 05/02/13 2126 05/03/13 0413 05/03/13 0445  HGB 14.3  --  12.7  --  12.8  --   --   --   --   --  11.2*  --   HCT 44.0  --  40.0  --  39.6  --   --   --   --   --  34.6*  --   PLT 436*  --  328  --  291  --   --   --   --   --  303  --   APTT  --   --  30  --   --   --   --   --   --   --   --   --   LABPROT 14.0  --  14.5  --   --   --   --   --   --   --   --   --   INR 1.10  --  1.15  --   --   --   --   --   --   --   --   --   HEPARINUNFRC  --   --   --   --   --   --   --   --  <0.10*  --   --  0.34  CREATININE 0.74  --  0.69  --  0.75  --   --   --   --   --   --   --   CKTOTAL 129  --   --   --   --   --   --   --   --   --   --   --   TROPONINI  --   < >  --   < >  --   < > 2.80* 1.80*  --  2.15*  --   --   < > = values in this interval not displayed.  Estimated Creatinine Clearance: 61.7 ml/min (by C-G formula based on Cr of 0.75).  . sodium chloride 50 mL/hr at 05/03/13 0042  . feeding supplement (VITAL HIGH PROTEIN) 1,000 mL (05/02/13 1251)  . heparin 1,050 Units/hr (05/02/13 1949)  . propofol 40 mcg/kg/min (05/03/13 0317)    Assessment: Patient is a 78 y.o M on heparin for ACS.  First heparin level undetectable.  Per RN, no issues with IV line and no bleeding noted.  Heparin level now therapeutic on 1050 units/hr.  No bleeding or complications noted per chart notes.  Goal of Therapy:  Heparin level 0.3-0.7 units/ml Monitor platelets by anticoagulation protocol: Yes   Plan:   1) Continue IV heparin drip 1050 units/hr 2) recheck another 8 hour heparin level  Tad Moore, BCPS  Clinical Pharmacist Pager [phone]  05/03/2013 5:32 AM

## 2013-05-03 NOTE — Progress Notes (Signed)
eLink Physician-Brief Progress Note Patient Name: Brenda Hogan DOB: [birth year] MRN: [id number]  Date of Service  05/03/2013   HPI/Events of Note   Glu controled  eICU Interventions  Transition to lantus ssi   Intervention Category Major Interventions: Hyperglycemia - active titration of insulin therapy  Nelda Bucks. 05/03/2013, 3:35 AM

## 2013-05-03 NOTE — Procedures (Signed)
Extubation Procedure Note  Patient Details:   Name: Tylicia Sherman DOB: Feb 18, 1935 MRN: [id number]   Airway Documentation:  Airway 6.5 mm (Active)  Secured at (cm) 22 cm 05/03/2013  8:18 AM  Measured From Lips 05/03/2013  8:18 AM  Secured Location Right 05/03/2013  8:18 AM  Secured By [employer] 05/03/2013  8:18 AM  Tube Holder Repositioned Yes 05/03/2013  8:18 AM  Cuff Pressure (cm H2O) 26 cm H2O 05/03/2013  8:18 AM  Site Condition Dry 05/02/2013  4:07 PM    Evaluation  O2 sats: stable throughout Complications: No apparent complications Patient did tolerate procedure well. Bilateral Breath Sounds: Diminished;Rhonchi Suctioning: Oral;Airway Yes, Pt. able to hold head off bed on own, positive cuff leak, NIF(-20), FVC(800cc), placed 4 lp n/c on prior to extubation, no Stridor noted s/p, I/S started, RT to monitor.  Joylene John 05/03/2013, 9:05 AM

## 2013-05-03 NOTE — Progress Notes (Signed)
Request for social services consult.  Patient was described by EMS as, "Pt was found to be lying in feces and urine, all with strong odor.Marland KitchenMarland KitchenFamily states they have attempted to acquire home health or SNF placement, however pt refuses any kind of help." I discussed with patient her living conditions, and she is eager to go home. When asked how she uses the restroom at home since she stays in bed, patient explained that she uses pads under herself or a shallow pan. She explains she cannot lift herself for a deeper pan. I spoke with her daughter about receiving help from social services, and she is very receptive.  Thank you,  Zenovia Jordan, RN

## 2013-05-04 ENCOUNTER — Inpatient Hospital Stay (HOSPITAL_COMMUNITY): Payer: Medicare Other

## 2013-05-04 ENCOUNTER — Encounter (HOSPITAL_COMMUNITY): Payer: Self-pay

## 2013-05-04 DIAGNOSIS — A419 Sepsis, unspecified organism: Principal | ICD-10-CM

## 2013-05-04 DIAGNOSIS — J101 Influenza due to other identified influenza virus with other respiratory manifestations: Secondary | ICD-10-CM | POA: Diagnosis present

## 2013-05-04 DIAGNOSIS — J111 Influenza due to unidentified influenza virus with other respiratory manifestations: Secondary | ICD-10-CM

## 2013-05-04 DIAGNOSIS — I214 Non-ST elevation (NSTEMI) myocardial infarction: Secondary | ICD-10-CM | POA: Diagnosis not present

## 2013-05-04 LAB — BASIC METABOLIC PANEL
CO2: 24 mEq/L (ref 19–32)
Calcium: 8 mg/dL — ABNORMAL LOW (ref 8.4–10.5)
Chloride: 102 mEq/L (ref 96–112)
GFR calc Af Amer: >90 mL/min (ref >90)
Glucose, Bld: 239 mg/dL — ABNORMAL HIGH (ref 70–99)
Sodium: 135 mEq/L (ref 135–145)

## 2013-05-04 LAB — CULTURE, RESPIRATORY W GRAM STAIN

## 2013-05-04 LAB — HEPARIN LEVEL (UNFRACTIONATED): Heparin Unfractionated: 0.45 IU/mL (ref 0.30–0.70)

## 2013-05-04 LAB — CBC
Hemoglobin: 11.5 g/dL — ABNORMAL LOW (ref 12.0–15.0)
MCH: 28.5 pg (ref 26.0–34.0)
Platelets: 329 10*3/uL (ref 150–400)
RBC: 4.04 MIL/uL (ref 3.87–5.11)
RDW: 14 % (ref 11.5–15.5)

## 2013-05-04 LAB — GLUCOSE, CAPILLARY
Glucose-Capillary: 187 mg/dL — ABNORMAL HIGH (ref 70–99)
Glucose-Capillary: 237 mg/dL — ABNORMAL HIGH (ref 70–99)
Glucose-Capillary: 259 mg/dL — ABNORMAL HIGH (ref 70–99)
Glucose-Capillary: 266 mg/dL — ABNORMAL HIGH (ref 70–99)

## 2013-05-04 MED ORDER — FENTANYL CITRATE 0.05 MG/ML IJ SOLN: 100.0000 ug | Freq: Once | INTRAMUSCULAR | Status: DC

## 2013-05-04 MED ORDER — PANTOPRAZOLE SODIUM 40 MG IV SOLR
40.0000 mg | Freq: Every day | INTRAVENOUS | Status: DC
  Administered 2013-05-04 – 2013-05-08 (×5): 40 mg via INTRAVENOUS
  Filled 2013-05-04 (×5): qty 40

## 2013-05-04 MED ORDER — ETOMIDATE 2 MG/ML IV SOLN
30.0000 mg | Freq: Once | INTRAVENOUS | Status: DC
  Filled 2013-05-04: qty 15

## 2013-05-04 MED ORDER — MIDAZOLAM HCL 2 MG/2ML IJ SOLN
INTRAMUSCULAR | Status: AC
  Administered 2013-05-04: 2 mg
  Filled 2013-05-04: qty 4

## 2013-05-04 MED ORDER — MORPHINE SULFATE 2 MG/ML IJ SOLN
2.0000 mg | INTRAMUSCULAR | Status: DC | PRN
  Administered 2013-05-04: 2 mg via INTRAVENOUS

## 2013-05-04 MED ORDER — MIDAZOLAM HCL 2 MG/2ML IJ SOLN
1.0000 mg | INTRAMUSCULAR | Status: DC | PRN
  Administered 2013-05-05 – 2013-05-06 (×5): 2 mg via INTRAVENOUS
  Filled 2013-05-04 (×5): qty 2

## 2013-05-04 MED ORDER — HEPARIN (PORCINE) IN NACL 100-0.45 UNIT/ML-% IJ SOLN
1050.0000 [IU]/h | INTRAMUSCULAR | Status: DC
  Administered 2013-05-04 (×2): 1200 [IU]/h via INTRAVENOUS
  Administered 2013-05-05: 1100 [IU]/h via INTRAVENOUS
  Administered 2013-05-06: 1000 [IU]/h via INTRAVENOUS
  Administered 2013-05-07 – 2013-05-10 (×4): 1050 [IU]/h via INTRAVENOUS
  Filled 2013-05-04 (×14): qty 250

## 2013-05-04 MED ORDER — ATROPINE SULFATE 1 MG/ML IJ SOLN: 1.0000 mg | INTRAMUSCULAR | Status: AC | PRN

## 2013-05-04 MED ORDER — FUROSEMIDE 10 MG/ML IJ SOLN
80.0000 mg | Freq: Four times a day (QID) | INTRAMUSCULAR | Status: AC
  Administered 2013-05-04 (×2): 80 mg via INTRAVENOUS
  Filled 2013-05-04 (×2): qty 8

## 2013-05-04 MED ORDER — INSULIN GLARGINE 100 UNIT/ML ~~LOC~~ SOLN
25.0000 [IU] | Freq: Every day | SUBCUTANEOUS | Status: DC
  Filled 2013-05-04: qty 0.25

## 2013-05-04 MED ORDER — SODIUM CHLORIDE 0.9 % IV SOLN
0.0000 ug/h | INTRAVENOUS | Status: DC
  Administered 2013-05-04: 100 ug/h via INTRAVENOUS
  Administered 2013-05-05: 200 ug/h via INTRAVENOUS
  Administered 2013-05-06: 150 ug/h via INTRAVENOUS
  Filled 2013-05-04 (×3): qty 50

## 2013-05-04 MED ORDER — ROCURONIUM BROMIDE 50 MG/5ML IV SOLN
55.0000 mg | INTRAVENOUS | Status: AC | PRN
  Administered 2013-05-04: 70 mg via INTRAVENOUS
  Filled 2013-05-04: qty 12

## 2013-05-04 MED ORDER — ETOMIDATE 2 MG/ML IV SOLN
16.5000 mg | INTRAVENOUS | Status: AC | PRN
  Administered 2013-05-04: 20 mg via INTRAVENOUS

## 2013-05-04 MED ORDER — FENTANYL BOLUS VIA INFUSION
25.0000 ug | INTRAVENOUS | Status: DC | PRN
  Administered 2013-05-04 – 2013-05-06 (×6): 50 ug via INTRAVENOUS
  Filled 2013-05-04: qty 50

## 2013-05-04 MED ORDER — SUCCINYLCHOLINE CHLORIDE 20 MG/ML IJ SOLN
83.0000 mg | INTRAMUSCULAR | Status: AC | PRN
  Filled 2013-05-04: qty 7.5

## 2013-05-04 MED ORDER — LIDOCAINE HCL (CARDIAC) 20 MG/ML IV SOLN: 83.0000 mg | INTRAVENOUS | Status: AC | PRN

## 2013-05-04 MED ORDER — VECURONIUM BROMIDE 10 MG IV SOLR: 5.5000 mg | INTRAVENOUS | Status: AC | PRN

## 2013-05-04 MED ORDER — ROCURONIUM BROMIDE 50 MG/5ML IV SOLN
1.0000 mg/kg | Freq: Once | INTRAVENOUS | Status: DC
  Filled 2013-05-04: qty 8.81

## 2013-05-04 MED ORDER — FENTANYL CITRATE 0.05 MG/ML IJ SOLN: 50.0000 ug | Freq: Once | INTRAMUSCULAR | Status: AC

## 2013-05-04 MED ORDER — INSULIN GLARGINE 100 UNIT/ML ~~LOC~~ SOLN
25.0000 [IU] | Freq: Every day | SUBCUTANEOUS | Status: DC
  Administered 2013-05-05 – 2013-05-06 (×2): 25 [IU] via SUBCUTANEOUS
  Filled 2013-05-04 (×2): qty 0.25

## 2013-05-04 MED ORDER — FUROSEMIDE 10 MG/ML IJ SOLN
INTRAMUSCULAR | Status: AC
  Administered 2013-05-04: 40 mg
  Filled 2013-05-04: qty 4

## 2013-05-04 MED ORDER — FUROSEMIDE 10 MG/ML IJ SOLN
80.0000 mg | Freq: Once | INTRAMUSCULAR | Status: AC
  Administered 2013-05-04: 40 mg via INTRAVENOUS

## 2013-05-04 MED ORDER — INSULIN GLARGINE 100 UNIT/ML ~~LOC~~ SOLN
10.0000 [IU] | SUBCUTANEOUS | Status: AC
  Administered 2013-05-04: 10 [IU] via SUBCUTANEOUS
  Filled 2013-05-04: qty 0.1

## 2013-05-04 MED ORDER — MORPHINE SULFATE 2 MG/ML IJ SOLN
INTRAMUSCULAR | Status: AC
  Administered 2013-05-04: 2 mg via INTRAMUSCULAR
  Filled 2013-05-04: qty 1

## 2013-05-04 MED ORDER — FUROSEMIDE 10 MG/ML IJ SOLN
INTRAMUSCULAR | Status: AC
  Filled 2013-05-04: qty 4

## 2013-05-04 MED ORDER — HYDRALAZINE HCL 20 MG/ML IJ SOLN
10.0000 mg | INTRAMUSCULAR | Status: DC | PRN
  Administered 2013-05-05 – 2013-05-07 (×3): 20 mg via INTRAVENOUS
  Filled 2013-05-04 (×4): qty 1

## 2013-05-04 MED ORDER — MORPHINE SULFATE 2 MG/ML IJ SOLN
INTRAMUSCULAR | Status: AC
  Filled 2013-05-04: qty 1

## 2013-05-04 MED ORDER — FENTANYL CITRATE 0.05 MG/ML IJ SOLN
INTRAMUSCULAR | Status: AC
  Administered 2013-05-04: 50 ug
  Filled 2013-05-04: qty 4

## 2013-05-04 MED ORDER — MIDAZOLAM HCL 2 MG/2ML IJ SOLN: 4.0000 mg | Freq: Once | INTRAMUSCULAR | Status: DC

## 2013-05-04 NOTE — Progress Notes (Signed)
1000 Pt began experiencing respiratory distress. HR/RR increased, Labored work of breathing, O2 sats began decreasing, increased pt oxygen to 4L [place], called respiratory therapy, informed MD. Pt placed on BiPap, 2mg  morphine given, 80 mg lasix given, will continue to monitor.

## 2013-05-04 NOTE — Procedures (Signed)
Intubation Procedure Note Brenda Hogan [id number] [birth year]  Procedure: Intubation Indications: Respiratory insufficiency  Procedure Details Consent: Risks of procedure as well as the alternatives and risks of each were explained to the (patient/caregiver).  Consent for procedure obtained. Time Out: Verified patient identification, verified procedure, site/side was marked, verified correct patient position, special equipment/implants available, medications/allergies/relevent history reviewed, required imaging and test results available.  Performed  Drugs versed 2mg , fentanyl , etomidate 20mg , rocuronium 70mg  DL x 1 with MAC 3 blade Grade 1 view 7.5 ET tube passed through cords under direct visualization Placement confirmed with bilateral breath sounds, positive EtCO2 change and smoke in tube   Evaluation Hemodynamic Status: BP stable throughout; O2 sats: stable throughout Patient's Current Condition: stable Complications: No apparent complications Patient did tolerate procedure well. Chest X-ray ordered to verify placement.  CXR: pending.   Max Fickle 05/04/2013

## 2013-05-04 NOTE — Progress Notes (Signed)
PT Cancellation Note  Patient Details Name: Brenda Hogan MRN: [id number] DOB: Sep 18, 1934   Cancelled Treatment:    Reason Eval/Treat Not Completed: Medical issues which prohibited therapy.  Pt recently went into respiratory distress. 05/04/2013  [place] Bing, PT [phone]  (pager)   Reola Buckles, [name] 05/04/2013, 3:20 PM

## 2013-05-04 NOTE — Progress Notes (Signed)
NUTRITION FOLLOW UP  Intervention:    Recommend resume TF via OGT while patient is intubated with Vital High Protein at 20 ml/h, increase by 10 ml every 4 hours to goal rate of 50 ml/h to provide 1200 kcals (24 kcals/kg ideal weight), 105 gm protein, 1003 ml free water daily.  Nutrition Dx:   Inadequate oral intake related to inability to eat as evidenced by NPO status.  Goal:   Enteral nutrition to provide 60-70% of estimated calorie needs (22-25 kcals/kg ideal body weight) and 100% of estimated protein needs, based on ASPEN guidelines for permissive underfeeding in critically ill obese individuals. Unmet.  Monitor:   TF re-initiation/tolerance/adequacy, weight trend, labs, vent status.  Assessment:   Obese 78 yo female with COPD, bed bound, found in respiratory distress, intubated by EMS, admitted on 12/26. Found to have influenza A.  Patient was extubated on 12/28. Dysphagia 1 diet with thin liquids was started. Patient required re-intubation earlier today. Discussed patient in ICU rounds today.  Patient is currently intubated on ventilator support.  MV: 7.6 L/min Temp (24hrs), Avg:98.6 F (37 C), Min:96.4 F (35.8 C), Max:99.2 F (37.3 C)   Height: Ht Readings from Last 1 Encounters:  05/01/13 5\' 2"  (1.575 m)    Weight Status:   Wt Readings from Last 1 Encounters:  05/04/13 196 lb 6.9 oz (89.1 kg)  05/01/13  191 lb 6.4 oz (86.818 kg)   Re-estimated needs:  Kcal: 1510 Protein: 100 gm Fluid: 1.5-1.7 L  Skin: no wounds  Diet Order: NPO   Intake/Output Summary (Last 24 hours) at 05/04/13 1347 Last data filed at 05/04/13 1300  Gross per 24 hour  Intake 1764.83 ml  Output   2570 ml  Net -805.17 ml    Last BM: none documented   Labs:   Recent Labs Lab 05/01/13 1643 05/01/13 2150 05/02/13 0354 05/04/13 0335  NA 129* 130* 129* 135  K 5.0 4.5 4.7 3.8  CL 91* 96 97 102  CO2 23 22 20 24   BUN 15 16 19 23   CREATININE 0.74 0.69 0.75 0.75  CALCIUM 8.5  7.5* 7.4* 8.0*  MG  --  1.3* 3.4*  --   PHOS  --  4.4 3.6  --   GLUCOSE 385* 337* 272* 239*    CBG (last 3)   Recent Labs  05/04/13 0350 05/04/13 0811 05/04/13 1154  GLUCAP 228* 187* 266*    Scheduled Meds: . antiseptic oral rinse  15 mL Mouth Rinse QID  . aspirin  81 mg Oral Daily  . atorvastatin  40 mg Oral q1800  . chlorhexidine  15 mL Mouth Rinse BID  . furosemide  80 mg Intravenous Q6H  . insulin aspart  0-15 Units Subcutaneous Q4H  . [START ON 05/05/2013] insulin glargine  25 Units Subcutaneous Daily  . ipratropium  0.5 mg Nebulization Q4H  . levalbuterol  1.25 mg Nebulization Q4H  . levofloxacin (LEVAQUIN) IV  750 mg Intravenous Q24H  . methylPREDNISolone (SOLU-MEDROL) injection  80 mg Intravenous Q8H  . metoprolol  5 mg Intravenous Q6H  . oseltamivir  75 mg Oral BID  . pantoprazole (PROTONIX) IV  40 mg Intravenous Daily  . piperacillin-tazobactam (ZOSYN)  IV  3.375 g Intravenous Q8H  . pneumococcal 23 valent vaccine  0.5 mL Intramuscular Tomorrow-1000    Continuous Infusions: . sodium chloride 50 mL/hr at 05/03/13 0042  . fentaNYL infusion INTRAVENOUS 100 mcg/hr (05/04/13 1300)  . heparin 1,200 Units/hr (05/04/13 0427)    Joaquin Courts, RD,  LDN, CNSC Pager [phone] After Hours Pager [phone]

## 2013-05-04 NOTE — Progress Notes (Signed)
SUBJECTIVE:  Progressive respiratory distress earlier this AM with reintubation.  EKG this AM with nonspecific changes unchanged from the admission EKG.     PHYSICAL EXAM Filed Vitals:   05/04/13 1100 05/04/13 1200 05/04/13 1300 05/04/13 1400  BP: 222/120 190/102 105/53 119/65  Pulse: 124 117 84 78  Temp: 98.6 F (37 C) 97.5 F (36.4 C) 96.4 F (35.8 C) 95.5 F (35.3 C)  TempSrc: Oral Oral Oral Oral  Resp: 30 24 0 0  Height:      Weight:      SpO2: 100% 100% 100% 100%   General:  [place].  She opens her eyes and responds.  She is sedated Lungs:  Diffuse transmitted upper airway sounds with fine crackles Heart:  RRR Abdomen:  Positive bowel sounds, no rebound no guarding Extremities:  No edema Neuro:  Sedated  LABS: Lab Results  Component Value Date   TROPONINI 2.15* 05/02/2013   Results for orders placed during the hospital encounter of 05/01/13 (from the past 24 hour(s))  GLUCOSE, CAPILLARY     Status: Abnormal   Collection Time    05/03/13  3:06 PM      Result Value Range   Glucose-Capillary 234 (*) 70 - 99 mg/dL  GLUCOSE, CAPILLARY     Status: Abnormal   Collection Time    05/03/13  7:36 PM      Result Value Range   Glucose-Capillary 287 (*) 70 - 99 mg/dL   Comment 1 Notify RN    GLUCOSE, CAPILLARY     Status: Abnormal   Collection Time    05/03/13 11:40 PM      Result Value Range   Glucose-Capillary 259 (*) 70 - 99 mg/dL   Comment 1 Notify RN    HEPARIN LEVEL (UNFRACTIONATED)     Status: Abnormal   Collection Time    05/04/13  3:35 AM      Result Value Range   Heparin Unfractionated 0.28 (*) 0.30 - 0.70 IU/mL  CBC     Status: Abnormal   Collection Time    05/04/13  3:35 AM      Result Value Range   WBC 21.1 (*) 4.0 - 10.5 K/uL   RBC 4.04  3.87 - 5.11 MIL/uL   Hemoglobin 11.5 (*) 12.0 - 15.0 g/dL   HCT 09.8 (*) 11.9 - 14.7 %   MCV 87.6  78.0 - 100.0 fL   MCH 28.5  26.0 - 34.0 pg   MCHC 32.5  30.0 - 36.0 g/dL   RDW 82.9  56.2 - 13.0 %   Platelets 329  150 - 400 K/uL  BASIC METABOLIC PANEL     Status: Abnormal   Collection Time    05/04/13  3:35 AM      Result Value Range   Sodium 135  135 - 145 mEq/L   Potassium 3.8  3.5 - 5.1 mEq/L   Chloride 102  96 - 112 mEq/L   CO2 24  19 - 32 mEq/L   Glucose, Bld 239 (*) 70 - 99 mg/dL   BUN 23  6 - 23 mg/dL   Creatinine, Ser 8.65  0.50 - 1.10 mg/dL   Calcium 8.0 (*) 8.4 - 10.5 mg/dL   GFR calc non Af Amer 80 (*) >90 mL/min   GFR calc Af Amer >90  >90 mL/min  GLUCOSE, CAPILLARY     Status: Abnormal   Collection Time    05/04/13  3:50 AM      Result Value  Range   Glucose-Capillary 228 (*) 70 - 99 mg/dL   Comment 1 Notify RN    GLUCOSE, CAPILLARY     Status: Abnormal   Collection Time    05/04/13  8:11 AM      Result Value Range   Glucose-Capillary 187 (*) 70 - 99 mg/dL  HEPARIN LEVEL (UNFRACTIONATED)     Status: None   Collection Time    05/04/13  9:47 AM      Result Value Range   Heparin Unfractionated 0.45  0.30 - 0.70 IU/mL  GLUCOSE, CAPILLARY     Status: Abnormal   Collection Time    05/04/13 11:54 AM      Result Value Range   Glucose-Capillary 266 (*) 70 - 99 mg/dL    Intake/Output Summary (Last 24 hours) at 05/04/13 1438 Last data filed at 05/04/13 1300  Gross per 24 hour  Intake 1704.33 ml  Output   2455 ml  Net -750.67 ml    EKG:  As above.   ASSESSMENT AND PLAN:  Respiratory Failure:  Per CCM.   Reintubated.  She has some edema on CXR.  In addition her EF is reduced 40 - 45% global.  Unclear if this is secondary to the acute illness or existed previously undiagnosed.    Continue diuresis as BP allows.  Currently ordered for Lasix IV 80 mg q6 hours.    NSTEMI (non-ST elevated myocardial infarction):    Initial plan was for a noninvasive evaluation.  However, we will follow along and adapt this plan as needed.  At this point I would lean toward an invasive evaluation prior to discharge.   Discussed cardiac issues with the patient's daughter.  Multiple  results and studies reviewed.      Rollene Rotunda 05/04/2013 2:38 PM

## 2013-05-04 NOTE — Progress Notes (Signed)
ANTICOAGULATION CONSULT NOTE - Follow Up Consult  Pharmacy Consult for Heparin Indication: chest pain/ACS  No Known Allergies  Patient Measurements: Height: 5\' 2"  (157.5 cm) Weight: 194 lb 3.6 oz (88.1 kg) IBW/kg (Calculated) : 50.1 Heparin Dosing Weight: ~70 kg  Vital Signs: Temp: 98.9 F (37.2 C) (12/29 0000) BP: 162/76 mmHg (12/29 0000) Pulse Rate: 97 (12/29 0000)  Labs:  Recent Labs  05/01/13 1643  05/01/13 2150  05/02/13 0354  05/02/13 1025 05/02/13 1450  05/02/13 2126 05/03/13 0413 05/03/13 0445 05/03/13 1330 05/04/13 0335  HGB 14.3  --  12.7  --  12.8  --   --   --   --   --  11.2*  --   --  11.5*  HCT 44.0  --  40.0  --  39.6  --   --   --   --   --  34.6*  --   --  35.4*  PLT 436*  --  328  --  291  --   --   --   --   --  303  --   --  329  APTT  --   --  30  --   --   --   --   --   --   --   --   --   --   --   LABPROT 14.0  --  14.5  --   --   --   --   --   --   --   --   --   --   --   INR 1.10  --  1.15  --   --   --   --   --   --   --   --   --   --   --   HEPARINUNFRC  --   --   --   --   --   --   --   --   < >  --   --  0.34 0.35 0.28*  CREATININE 0.74  --  0.69  --  0.75  --   --   --   --   --   --   --   --   --   CKTOTAL 129  --   --   --   --   --   --   --   --   --   --   --   --   --   TROPONINI  --   < >  --   < >  --   < > 2.80* 1.80*  --  2.15*  --   --   --   --   < > = values in this interval not displayed.  Estimated Creatinine Clearance: 61.7 ml/min (by C-G formula based on Cr of 0.75).   Medications:  Infusions:  . sodium chloride 50 mL/hr at 05/03/13 0042  . heparin      Assessment: 78 year old female receiving anticoagulation with Heparin for ACS.  Her heparin level now slightly subtherapeutic at 0.28.  Note her hemoglobin has trended down slightly today.  Her platelet count is stable.  No bleeding noted.  Goal of Therapy:  Heparin level 0.3-0.7 units/ml Monitor platelets by anticoagulation protocol: Yes   Plan:  1.  Increase IV heparin to 1200 units/hr. 2. Recheck heparin level in 6 hrs. 3. Continue daily heparin level and CBC.  Tad Moore, BCPS  Clinical Pharmacist Pager [phone]  05/04/2013 4:15 AM

## 2013-05-04 NOTE — Progress Notes (Signed)
ANTICOAGULATION CONSULT NOTE - Follow Up Consult  Pharmacy Consult for Heparin Indication: chest pain/ACS  No Known Allergies  Patient Measurements: Height: 5\' 2"  (157.5 cm) Weight: 196 lb 6.9 oz (89.1 kg) IBW/kg (Calculated) : 50.1 Heparin Dosing Weight: ~70 kg  Vital Signs: Temp: 98.9 F (37.2 C) (12/29 1024) Temp src: Oral (12/29 1000) BP: 129/96 mmHg (12/29 1017) Pulse Rate: 132 (12/29 1024)  Labs:  Recent Labs  05/01/13 1643  05/01/13 2150  05/02/13 0354  05/02/13 1025 05/02/13 1450  05/02/13 2126 05/03/13 0413  05/03/13 1330 05/04/13 0335 05/04/13 0947  HGB 14.3  --  12.7  --  12.8  --   --   --   --   --  11.2*  --   --  11.5*  --   HCT 44.0  --  40.0  --  39.6  --   --   --   --   --  34.6*  --   --  35.4*  --   PLT 436*  --  328  --  291  --   --   --   --   --  303  --   --  329  --   APTT  --   --  30  --   --   --   --   --   --   --   --   --   --   --   --   LABPROT 14.0  --  14.5  --   --   --   --   --   --   --   --   --   --   --   --   INR 1.10  --  1.15  --   --   --   --   --   --   --   --   --   --   --   --   HEPARINUNFRC  --   --   --   --   --   --   --   --   < >  --   --   < > 0.35 0.28* 0.45  CREATININE 0.74  --  0.69  --  0.75  --   --   --   --   --   --   --   --  0.75  --   CKTOTAL 129  --   --   --   --   --   --   --   --   --   --   --   --   --   --   TROPONINI  --   < >  --   < >  --   < > 2.80* 1.80*  --  2.15*  --   --   --   --   --   < > = values in this interval not displayed.  Estimated Creatinine Clearance: 62.1 ml/min (by C-G formula based on Cr of 0.75).   Medications:  Infusions:  . sodium chloride 50 mL/hr at 05/03/13 0042  . heparin 1,200 Units/hr (05/04/13 0427)    Assessment: 78 year old female receiving anticoagulation with Heparin for ACS.  Her heparin level now at goal after rate adjustment this AM. No bleeding noted.  Goal of Therapy:  Heparin level 0.3-0.7 units/ml Monitor platelets by anticoagulation  protocol: Yes   Plan:  1. Continue heparin gtt at 1200 units/hr 2. F/u AM heparin level and CBC 3. F/u LOT of heparin  Lysle Pearl, PharmD, BCPS Pager # [phone] 05/04/2013 10:36 AM

## 2013-05-04 NOTE — Care Management Note (Signed)
    Page 1 of 2   05/12/2013     3:32:45 PM   CARE MANAGEMENT NOTE 05/12/2013  Patient:  Brenda, Hogan   Account Number:  [id number]  Date Initiated:  05/04/2013  Documentation initiated by:  Junius Creamer  Subjective/Objective Assessment:   adm w mi,resp failure     Action/Plan:   lives with family, pcp dr Richardson Landry burns   Anticipated DC Date:  05/12/2013   Anticipated DC Plan:  HOME W HOME HEALTH SERVICES      DC Planning Services  CM consult      [facility] Choice  HOME HEALTH   Choice offered to / List presented to:  C-1 Patient        HH arranged  HH-1 RN  HH-2 PT  HH-3 OT  HH-4 NURSE'S AIDE  HH-4 NURSE'S AIDE      HH agency  [facility]   Status of service:  Completed, signed off Medicare Important Message given?   (If response is "NO", the following Medicare IM given date fields will be blank) Date Medicare IM given:   Date Additional Medicare IM given:    Discharge Disposition:  HOME W HOME HEALTH SERVICES  Per UR Regulation:  Reviewed for med. necessity/level of care/duration of stay  If discussed at Long Length of Stay Meetings, dates discussed:    Comments:  ContactKashlyn, Brenda Hogan Daughter [phone]  05/12/13 Brenda Lipuma,RN,BSN [phone] PT FOR DC HOME TODAY WITH DTR; [facility] AWARE OF DC.  PT WILL NEED AMBULANCE TRANSPORT HOME; REFERRAL TO CSW FOR TRANSPORTATION NEED.  05/11/13 Brenda Olazabal,RN,BSN [phone] PT REFUSING SNF; ADAMANT ON GOING HOME WITH FAMILY AND HH SUPPORT.  PT STATES SHE HAS USED [facility] IN THE PAST FOR HH NEEDS, AND WISHES TO USE AGAIN.  WILL ARRANGE HH AS ORDERED; CSW ADDED SHOULD PT NEED SNF PLACEMENT FROM HOME. START OF CARE FOR HH 24-48H POST DC DATE.  CSW SPOKE WITH DAUGHTER WHO IS AGREEABLE TO DC PLAN OF DC TO HER HOME WITH HH FOLLOW UP.   05-06-13 12:30pm Brenda Hogan, RNBSN - [phone] Extubated on 12-28 - reintubated on 12-29.  Hope to extubate today. Also NSTEMI - May be Ltach candidate - depending on  progression.

## 2013-05-04 NOTE — Progress Notes (Signed)
PULMONARY  / CRITICAL CARE MEDICINE  Name: Brenda Hogan  MRN: [id number] DOB: 02-07-1935  ADMISSION DATE:  05/01/2013 CONSULTATION DATE:  05/01/2013  REFERRING MD :  EDP PRIMARY SERVICE: PCCM  CHIEF COMPLAINT:  Acute respiratory failure  BRIEF PATIENT DESCRIPTION: 78 yo with COPD, bed bound, found in respiratory distress, intubated by EMS. Troponin elevated upon arrival to ICU.  SIGNIFICANT EVENTS / STUDIES:  12/26  Head CT >>> neg 05-07-2023  TTE >>> 12/28 echo> LVEF 40-45%, LVH (mild), mild MR, IVC dilated  LINES / TUBES: OETT 12/26 >>> 12/28 OGT 12/26 >>> 12/28 Foley 12/26 >>>  CULTURES: 12/26  MRSA >>> neg 12/26  Blood >>> 12/26  Urine >>> 12/26 Trach aspirate >>> 12/26  Multoplex Virus PCR >>> influenza A H3  ANTIBIOTICS: Ceftriaxone 12/26 x1 Vancomycin 12/26 >>> 12/28 Levaquin 05-07-2023 >>> Tamiflu 12/26 >>>   INTERVAL HISTORY:  Respiratory distress this morning  VITAL SIGNS: Temp:  [98 F (36.7 C)-99.2 F (37.3 C)] 98.9 F (37.2 C) (12/29 1024) Pulse Rate:  [76-135] 132 (12/29 1024) Cardiac Rhythm:  [-] Sinus tachycardia (12/29 1000) Resp:  [16-30] 26 (12/29 1024) BP: (108-193)/(61-156) 129/96 mmHg (12/29 1017) SpO2:  [93 %-100 %] 100 % (12/29 1024) Weight:  [89.1 kg (196 lb 6.9 oz)] 89.1 kg (196 lb 6.9 oz) (12/29 0412)  VENTILATOR SETTINGS: Vent Mode:  [-] PRVC FiO2 (%):  [30 %] 30 % Set Rate:  [28 bmp] 28 bmp Vt Set:  [420 mL] 420 mL PEEP:  [5 cmH20] 5 cmH20 Plateau Pressure:  [4 cmH20-21 cmH20] 20 cmH20  INTAKE / OUTPUT: Intake/Output     12/28 0701 - 12/29 0700 12/29 0701 - 12/30 0700   I.V. (mL/kg) 1476.5 (16.6) 186 (2.1)   NG/GT 41    IV Piggyback 450 200   Total Intake(mL/kg) 1967.5 (22.1) 386 (4.3)   Urine (mL/kg/hr) 2620 (1.2)    Total Output 2620     Net -652.5 +386        Urine Occurrence 1 x       PHYSICAL EXAMINATION: General:  Acute respiratory distress HEENT: NCAT, OP clear PULM: Wheezing, rhonchi bilaterally CV:  Tachy, regular AB: BS+, soft, nontender Ext: edema in ankles Neuro: Awake and alert, conversant (pre morphine), maew  LABS: CBC  Recent Labs Lab 2013/05/06 0354 05/03/13 0413 05/04/13 0335  WBC 21.4* 23.4* 21.1*  HGB 12.8 11.2* 11.5*  HCT 39.6 34.6* 35.4*  PLT 291 303 329   Coag's  Recent Labs Lab 05/01/13 1643 05/01/13 2150  APTT  --  30  INR 1.10 1.15   BMET  Recent Labs Lab 05/01/13 2150 05/06/13 0354 05/04/13 0335  NA 130* 129* 135  K 4.5 4.7 3.8  CL 96 97 102  CO2 22 20 24   BUN 16 19 23   CREATININE 0.69 0.75 0.75  GLUCOSE 337* 272* 239*   Electrolytes  Recent Labs Lab 05/01/13 2150 05-06-2013 0354 05/04/13 0335  CALCIUM 7.5* 7.4* 8.0*  MG 1.3* 3.4*  --   PHOS 4.4 3.6  --    Sepsis Markers  Recent Labs Lab 05/01/13 1729 05/01/13 1831 05/01/13 2158  LATICACIDVEN 3.42* 2.95* 2.4*  PROCALCITON  --   --  2.35   ABG  Recent Labs Lab 05/01/13 1723 2013/05/06 0132 May 06, 2013 0426  PHART 7.131* 7.317* 7.340*  PCO2ART 74.6* 43.9 42.6  PO2ART 462.0* 151.0* 115.0*   Liver Enzymes  Recent Labs Lab 05/01/13 1643 05/01/13 2150  AST 44* 55*  ALT 10 16  ALKPHOS 86  72  BILITOT 0.6 0.5  ALBUMIN 3.1* 2.6*   Cardiac Enzymes  Recent Labs Lab 05/01/13 1648 05/01/13 2158  05/02/13 1025 05/02/13 1450 05/02/13 2126  TROPONINI <0.30 1.30*  < > 2.80* 1.80* 2.15*  PROBNP  --  3477.0*  --   --   --   --   < > = values in this interval not displayed. Glucose  Recent Labs Lab 05/03/13 1110 05/03/13 1506 05/03/13 1936 05/03/13 2340 05/04/13 0350 05/04/13 0811  GLUCAP 279* 234* 287* 259* 228* 187*   CXR:  None today  ASSESSMENT / PLAN:  PULMONARY A:   Acute respiratory failure Severe AE COPD Influenza Likely flash pulm edema 12/29 AM P:   BIPAP stat Lasix 80mg  now Bronchodilator now and scheduled Continue solumedrol  CARDIOVASCULAR A: NSTEMI > class II MI due to stress of illness; 12/28 echo LVEF 40-45% P:  Needs MIBI/stress  per cardiology ASA, Lipitor Heparin per Pharmacy x 48 h Metoprolol 5 q6h, convert to oral once able to tolerate diet May add ACEI later Lasix now and q6h x3   RENAL A:  No acute issues P:   Trend BMP KVO fluids  GASTROINTESTINAL A:  No active issues. P:   NPO Pantoprazole for stress ulcer prophylaxis Diet later today  HEMATOLOGIC A:  No active issues. P:  Trend CBC Heparin gtt for ACS x 48 hours  INFECTIOUS A:  Influenza A H3 P:   Cont tamiflu Consider d/c abx 12/30  ENDOCRINE A:  Hyperglycemia. P:   SSI Lantus 15  NEUROLOGIC A:  Acute encephalopathy - resolved P:     Code status: full; apparently patient mentioned that she does not want to be intubated again; however when I mentioned this to her daughter she stated that she does want her intubated if she absolutely needs it Will need to discuss further, patient currently not able to participate in conversation due to morphine, BIPAP  I have personally obtained history, examined patient, evaluated and interpreted laboratory and imaging results, reviewed medical records, formulated assessment / plan and placed orders.  CRITICAL CARE:  The patient is critically ill with multiple organ systems failure and requires high complexity decision making for assessment and support, frequent evaluation and titration of therapies, application of advanced monitoring technologies and extensive interpretation of multiple databases. Critical Care Time devoted to patient care services described in this note is 35 minutes.   Brenda Hogan PCCM Pager: [phone] Cell: [phone] If no response, call [phone]   05/04/2013, 10:44 AM

## 2013-05-04 NOTE — Progress Notes (Signed)
Inpatient Diabetes Program Recommendations  AACE/ADA: New Consensus Statement on Inpatient Glycemic Control (2013)  Target Ranges:  Prepandial:   less than 140 mg/dL      Peak postprandial:   less than 180 mg/dL (1-2 hours)      Critically ill patients:  140 - 180 mg/dL   Reason for Assessmend: Sub-optimal glycemic control in the ICU  Results for Brenda Hogan, Brenda Hogan (MRN [id number]) as of 05/04/2013 15:12  Ref. Range 05/03/2013 07:18 05/03/2013 11:10 05/03/2013 15:06 05/03/2013 19:36 05/03/2013 23:40 05/04/2013 03:50 05/04/2013 08:11 05/04/2013 11:54  Glucose-Capillary Latest Range: 70-99 mg/dL 409 (H) 811 (H) 914 (H) 287 (H) 259 (H) 228 (H) 187 (H) 266 (H)   Note:  Patient required re-intubation this morning.  On steroids.  Dietitian recommending tube feeding.  Request patient order for Adult ICU Hyperglycemia Protocol order set, Phase 2.  Thank you.  Talli Kimmer S. Elsie Lincoln, RN, CNS, CDE Inpatient Diabetes Program, team pager [phone]

## 2013-05-05 DIAGNOSIS — I509 Heart failure, unspecified: Secondary | ICD-10-CM

## 2013-05-05 LAB — BASIC METABOLIC PANEL
CO2: 30 mEq/L (ref 19–32)
Calcium: 8 mg/dL — ABNORMAL LOW (ref 8.4–10.5)
GFR calc Af Amer: 62 mL/min — ABNORMAL LOW (ref >90)
GFR calc non Af Amer: 53 mL/min — ABNORMAL LOW (ref >90)
Glucose, Bld: 142 mg/dL — ABNORMAL HIGH (ref 70–99)
Potassium: 3.9 mEq/L (ref 3.7–5.3)
Sodium: 142 mEq/L (ref 137–147)

## 2013-05-05 LAB — GLUCOSE, CAPILLARY
Glucose-Capillary: 106 mg/dL — ABNORMAL HIGH (ref 70–99)
Glucose-Capillary: 155 mg/dL — ABNORMAL HIGH (ref 70–99)
Glucose-Capillary: 159 mg/dL — ABNORMAL HIGH (ref 70–99)
Glucose-Capillary: 166 mg/dL — ABNORMAL HIGH (ref 70–99)
Glucose-Capillary: 190 mg/dL — ABNORMAL HIGH (ref 70–99)
Glucose-Capillary: 193 mg/dL — ABNORMAL HIGH (ref 70–99)

## 2013-05-05 LAB — CBC WITH DIFFERENTIAL/PLATELET
Basophils Relative: 0 % (ref 0–1)
Eosinophils Absolute: 0 10*3/uL (ref 0.0–0.7)
Hemoglobin: 12.5 g/dL (ref 12.0–15.0)
Lymphocytes Relative: 9 % — ABNORMAL LOW (ref 12–46)
Lymphs Abs: 1.6 10*3/uL (ref 0.7–4.0)
MCH: 28.7 pg (ref 26.0–34.0)
MCHC: 32.6 g/dL (ref 30.0–36.0)
Monocytes Relative: 5 % (ref 3–12)
Neutrophils Relative %: 87 % — ABNORMAL HIGH (ref 43–77)
Platelets: 334 10*3/uL (ref 150–400)
RBC: 4.36 MIL/uL (ref 3.87–5.11)
WBC: 18.4 10*3/uL — ABNORMAL HIGH (ref 4.0–10.5)

## 2013-05-05 LAB — HEPARIN LEVEL (UNFRACTIONATED): Heparin Unfractionated: 0.74 IU/mL — ABNORMAL HIGH (ref 0.30–0.70)

## 2013-05-05 MED ORDER — FUROSEMIDE 10 MG/ML IJ SOLN
80.0000 mg | Freq: Once | INTRAMUSCULAR | Status: AC
  Administered 2013-05-05: 80 mg via INTRAVENOUS
  Filled 2013-05-05: qty 8

## 2013-05-05 MED ORDER — FUROSEMIDE 10 MG/ML IJ SOLN
80.0000 mg | Freq: Four times a day (QID) | INTRAMUSCULAR | Status: AC
  Administered 2013-05-05: 80 mg via INTRAVENOUS
  Filled 2013-05-05: qty 8

## 2013-05-05 MED ORDER — OSELTAMIVIR PHOSPHATE 30 MG PO CAPS
30.0000 mg | ORAL_CAPSULE | Freq: Two times a day (BID) | ORAL | Status: AC
  Administered 2013-05-05 – 2013-05-06 (×3): 30 mg via ORAL
  Filled 2013-05-05 (×3): qty 1

## 2013-05-05 MED ORDER — VITAL HIGH PROTEIN PO LIQD
1000.0000 mL | ORAL | Status: DC
  Administered 2013-05-05: 1000 mL
  Filled 2013-05-05 (×5): qty 1000

## 2013-05-05 NOTE — Progress Notes (Signed)
PT Cancellation Note  Patient Details Name: Amery Vandenbos MRN: [id number] DOB: November 12, 1934   Cancelled Treatment:    Reason Eval/Treat Not Completed: Medical issues which prohibited therapy Still in resp. distress on the vent. 05/05/2013  Manderson Bing, PT [phone]  (pager)   Arly Salminen, [name] 05/05/2013, 11:41 AM

## 2013-05-05 NOTE — Progress Notes (Signed)
NUTRITION FOLLOW UP  Intervention:    Start TF via OGT with Vital HP at 20 ml/h, increase by 10 ml every 4 hours to goal rate of 50 ml/h to provide 1200 kcals (24 kcals/kg ideal weight), 105 gm protein, 1003 ml free water daily.  Nutrition Dx:   Inadequate oral intake related to inability to eat as evidenced by NPO status. Ongoing.  Goal:   Enteral nutrition to provide 60-70% of estimated calorie needs (22-25 kcals/kg ideal body weight) and 100% of estimated protein needs, based on ASPEN guidelines for permissive underfeeding in critically ill obese individuals. Progressing.  Monitor:   TF tolerance/adequacy, weight trend, labs, vent status.  Assessment:   Obese 78 yo female with COPD, bed bound, found in respiratory distress, intubated by EMS, admitted on 12/26. Found to have influenza A.  Patient was extubated on 12/28. Patient required re-intubation on 12/29. Discussed patient in ICU rounds today. RD to start TF today.   Patient is currently intubated on ventilator support.  MV: 7.1 L/min Temp (24hrs), Avg:98 F (36.7 C), Min:95.5 F (35.3 C), Max:98.8 F (37.1 C)   Height: Ht Readings from Last 1 Encounters:  05/01/13 5\' 2"  (1.575 m)    Weight Status:   Wt Readings from Last 1 Encounters:  05/05/13 192 lb 10.9 oz (87.4 kg)  05/04/13  196 lb 6.9 oz (89.1 kg)  05/01/13  191 lb 6.4 oz (86.818 kg)   Body mass index is 35.23 kg/(m^2).   Re-estimated needs:  Kcal: 1510 Protein: 100 gm Fluid: 1.5-1.7 L  Skin: no wounds  Diet Order: NPO   Intake/Output Summary (Last 24 hours) at 05/05/13 1135 Last data filed at 05/05/13 1026  Gross per 24 hour  Intake 1956.23 ml  Output   4195 ml  Net -2238.77 ml    Last BM: none documented   Labs:   Recent Labs Lab 05/01/13 1643 05/01/13 2150 05/02/13 0354 05/04/13 0335 05/05/13 0500  NA 129* 130* 129* 135 142  K 5.0 4.5 4.7 3.8 3.9  CL 91* 96 97 102 98  CO2 23 22 20 24 30   BUN 15 16 19 23  33*  CREATININE  0.74 0.69 0.75 0.75 1.00  CALCIUM 8.5 7.5* 7.4* 8.0* 8.0*  MG  --  1.3* 3.4*  --   --   PHOS  --  4.4 3.6  --   --   GLUCOSE 385* 337* 272* 239* 142*    CBG (last 3)   Recent Labs  05/05/13 0007 05/05/13 0409 05/05/13 0808  GLUCAP 190* 159* 106*    Scheduled Meds: . antiseptic oral rinse  15 mL Mouth Rinse QID  . aspirin  81 mg Oral Daily  . atorvastatin  40 mg Oral q1800  . chlorhexidine  15 mL Mouth Rinse BID  . insulin aspart  0-15 Units Subcutaneous Q4H  . insulin glargine  25 Units Subcutaneous Daily  . ipratropium  0.5 mg Nebulization Q4H  . levalbuterol  1.25 mg Nebulization Q4H  . levofloxacin (LEVAQUIN) IV  750 mg Intravenous Q24H  . methylPREDNISolone (SOLU-MEDROL) injection  80 mg Intravenous Q8H  . metoprolol  5 mg Intravenous Q6H  . oseltamivir  30 mg Oral BID  . pantoprazole (PROTONIX) IV  40 mg Intravenous Daily  . pneumococcal 23 valent vaccine  0.5 mL Intramuscular Tomorrow-1000    Continuous Infusions: . sodium chloride 50 mL/hr at 05/05/13 0800  . fentaNYL infusion INTRAVENOUS 50 mcg/hr (05/05/13 0826)  . heparin 1,100 Units/hr (05/05/13 0800)  Molli Barrows, RD, LDN, [place] Pager [phone] After Hours Pager [phone]

## 2013-05-05 NOTE — Progress Notes (Signed)
ANTICOAGULATION CONSULT NOTE - Follow Up Consult  Pharmacy Consult for Heparin  Indication: chest pain/ACS  No Known Allergies  Patient Measurements: Height: 5\' 2"  (157.5 cm) Weight: 192 lb 10.9 oz (87.4 kg) IBW/kg (Calculated) : 50.1 Heparin Dosing Weight: ~70 kg  Vital Signs: Temp: 98.7 F (37.1 C) (12/30 0500) Temp src: Core (Comment) (12/30 0000) BP: 136/62 mmHg (12/30 0500) Pulse Rate: 91 (12/30 0500)  Labs:  Recent Labs  05/02/13 1025 05/02/13 1450  05/02/13 2126  05/03/13 0413  05/04/13 0335 05/04/13 0947 05/05/13 0500  HGB  --   --   --   --   < > 11.2*  --  11.5*  --  12.5  HCT  --   --   --   --   --  34.6*  --  35.4*  --  38.4  PLT  --   --   --   --   --  303  --  329  --  334  HEPARINUNFRC  --   --   < >  --   --   --   < > 0.28* 0.45 0.74*  CREATININE  --   --   --   --   --   --   --  0.75  --   --   TROPONINI 2.80* 1.80*  --  2.15*  --   --   --   --   --   --   < > = values in this interval not displayed.  Estimated Creatinine Clearance: 61.4 ml/min (by C-G formula based on Cr of 0.75).   Medications:  Heparin 1200 units/hr  Assessment: 78 y/o F on heparin for CP/ACS. HL 0.74. Other labs as above. No issues per RN.   Goal of Therapy:  Heparin level 0.3-0.7 units/ml Monitor platelets by anticoagulation protocol: Yes   Plan:  -Decrease heparin to 1100 units/hr -8 hour HL at 1400 -Daily CBC/HL -Monitor for bleeding  Thank you for allowing me to take part in this patient's care,  Abran Duke, PharmD Clinical Pharmacist Phone: [phone] Pager: [phone] 05/05/2013 5:57 AM

## 2013-05-05 NOTE — Progress Notes (Signed)
PULMONARY  / CRITICAL CARE MEDICINE  Name: Brenda Hogan  MRN: [id number] DOB: 06-06-1934  ADMISSION DATE:  05/01/2013 CONSULTATION DATE:  05/01/2013  REFERRING MD :  EDP PRIMARY SERVICE: PCCM  CHIEF COMPLAINT:  Acute respiratory failure  BRIEF PATIENT DESCRIPTION: 78 yo with COPD, bed bound, found in respiratory distress, intubated by EMS. Troponin elevated upon arrival to ICU.  SIGNIFICANT EVENTS / STUDIES:  12/26  Head CT >>> neg 2023/05/16  TTE >>> 12/28 echo> LVEF 40-45%, LVH (mild), mild MR, IVC dilated  LINES / TUBES: OETT 12/26 >>> 12/28 OGT 12/26 >>> 12/28 Foley 12/26 >>> OETT 12/29 >>  CULTURES: 12/26  MRSA >>> neg 12/26  Blood >>> 12/26  Urine >>> 12/26 Trach aspirate >>> 12/26  Multoplex Virus PCR >>> influenza A H3  ANTIBIOTICS: Ceftriaxone 12/26 x1 Vancomycin 12/26 >>> 12/28 Levaquin 05/16/23 >>>12/30 Tamiflu 12/26 >>>   INTERVAL HISTORY:  Failed SBT  VITAL SIGNS: Temp:  [97.2 F (36.2 C)-98.8 F (37.1 C)] 98.3 F (36.8 C) (12/30 1519) Pulse Rate:  [64-114] 91 (12/30 1519) Cardiac Rhythm:  [-] Normal sinus rhythm (12/30 1200) Resp:  [13-26] 16 (12/30 1519) BP: (101-205)/(44-133) 102/53 mmHg (12/30 1519) SpO2:  [97 %-100 %] 97 % (12/30 1519) FiO2 (%):  [40 %-100 %] 40 % (12/30 1519) Weight:  [87.4 kg (192 lb 10.9 oz)] 87.4 kg (192 lb 10.9 oz) (12/30 0500)  VENTILATOR SETTINGS: Vent Mode:  [-] PRVC FiO2 (%):  [30 %] 30 % Set Rate:  [28 bmp] 28 bmp Vt Set:  [420 mL] 420 mL PEEP:  [5 cmH20] 5 cmH20 Plateau Pressure:  [4 cmH20-21 cmH20] 20 cmH20  INTAKE / OUTPUT: Intake/Output     12/29 0701 - 12/30 0700 12/30 0701 - 12/31 0700   I.V. (mL/kg) 1741.2 (19.9) 531 (6.1)   NG/GT     IV Piggyback 300 150   Total Intake(mL/kg) 2041.2 (23.4) 681 (7.8)   Urine (mL/kg/hr) 4525 (2.2) 885 (1.1)   Total Output 4525 885   Net -2483.8 -204           PHYSICAL EXAMINATION: General:  Comfortable on vent HEENT: NCAT, OP clear PULM: cta on  vent CV: Tachy, regular AB: BS+, soft, nontender Ext: edema in ankles Neuro: comfortable on vent  LABS: CBC  Recent Labs Lab 05/03/13 0413 05/04/13 0335 05/05/13 0500  WBC 23.4* 21.1* 18.4*  HGB 11.2* 11.5* 12.5  HCT 34.6* 35.4* 38.4  PLT 303 329 334   Coag's  Recent Labs Lab 05/01/13 1643 05/01/13 2150  APTT  --  30  INR 1.10 1.15   BMET  Recent Labs Lab 15-May-2013 0354 05/04/13 0335 05/05/13 0500  NA 129* 135 142  K 4.7 3.8 3.9  CL 97 102 98  CO2 20 24 30   BUN 19 23 33*  CREATININE 0.75 0.75 1.00  GLUCOSE 272* 239* 142*   Electrolytes  Recent Labs Lab 05/01/13 2150 15-May-2013 0354 05/04/13 0335 05/05/13 0500  CALCIUM 7.5* 7.4* 8.0* 8.0*  MG 1.3* 3.4*  --   --   PHOS 4.4 3.6  --   --    Sepsis Markers  Recent Labs Lab 05/01/13 1729 05/01/13 1831 05/01/13 2158  LATICACIDVEN 3.42* 2.95* 2.4*  PROCALCITON  --   --  2.35   ABG  Recent Labs Lab 05/01/13 1723 May 15, 2013 0132 05-15-13 0426  PHART 7.131* 7.317* 7.340*  PCO2ART 74.6* 43.9 42.6  PO2ART 462.0* 151.0* 115.0*   Liver Enzymes  Recent Labs Lab 05/01/13 1643 05/01/13 2150  AST 44* 55*  ALT 10 16  ALKPHOS 86 72  BILITOT 0.6 0.5  ALBUMIN 3.1* 2.6*   Cardiac Enzymes  Recent Labs Lab 05/01/13 1648 05/01/13 2158  05/02/13 1025 05/02/13 1450 05/02/13 2126  TROPONINI <0.30 1.30*  < > 2.80* 1.80* 2.15*  PROBNP  --  3477.0*  --   --   --   --   < > = values in this interval not displayed. Glucose  Recent Labs Lab 05/04/13 1548 05/04/13 2015 05/05/13 0007 05/05/13 0409 05/05/13 0808 05/05/13 1108  GLUCAP 257* 237* 190* 159* 106* 166*   CXR:  None today  ASSESSMENT / PLAN:  PULMONARY A:   Acute respiratory failure Severe AE COPD Influenza Likely flash pulm edema 12/29 AM P:   Full vent support SBT 12/31 AM Bronchodilator now and scheduled Continue solumedrol  CARDIOVASCULAR A: NSTEMI > class II MI due to stress of illness; 12/28 echo LVEF 40-45% 12/29 >  pulm edema from CHF exac P:  Needs MIBI/stress per cardiology ASA, Lipitor Heparin per Pharmacy x 48 h Metoprolol 5 q6h, convert to oral once able to tolerate diet May add ACEI later Lasix x1 12/30  RENAL A:  No acute issues P:   Trend BMP KVO fluids  GASTROINTESTINAL A:  No active issues. P:   NPO Pantoprazole for stress ulcer prophylaxis Diet later today  HEMATOLOGIC A:  No active issues. P:  Trend CBC Heparin gtt for ACS x 48 hours  INFECTIOUS A:  Influenza A H3 P:   Cont tamiflu d/c abx 12/30  ENDOCRINE A:  Hyperglycemia. P:   SSI Lantus 15  NEUROLOGIC A:  Acute encephalopathy -  P:   -fentanyl for vent synchrony -benzo for anxiety  Code status: Full, continue to update family daily  I have personally obtained history, examined patient, evaluated and interpreted laboratory and imaging results, reviewed medical records, formulated assessment / plan and placed orders.  CRITICAL CARE:  The patient is critically ill with multiple organ systems failure and requires high complexity decision making for assessment and support, frequent evaluation and titration of therapies, application of advanced monitoring technologies and extensive interpretation of multiple databases. Critical Care Time devoted to patient care services described in this note is 35 minutes.   Yolonda Kida PCCM Pager: [phone] Cell: [phone] If no response, call [phone]   05/05/2013, 4:03 PM

## 2013-05-05 NOTE — Progress Notes (Signed)
ANTICOAGULATION CONSULT NOTE - Follow Up Consult  Pharmacy Consult for Heparin  Indication: chest pain/ACS  No Known Allergies  Patient Measurements: Height: 5\' 2"  (157.5 cm) Weight: 192 lb 10.9 oz (87.4 kg) IBW/kg (Calculated) : 50.1 Heparin Dosing Weight: ~70 kg  Vital Signs: Temp: 98.1 F (36.7 C) (12/30 1400) BP: 159/68 mmHg (12/30 1400) Pulse Rate: 103 (12/30 1400)  Labs:  Recent Labs  05/02/13 2126  05/03/13 0413  05/04/13 0335 05/04/13 0947 05/05/13 0500 05/05/13 1415  HGB  --   < > 11.2*  --  11.5*  --  12.5  --   HCT  --   --  34.6*  --  35.4*  --  38.4  --   PLT  --   --  303  --  329  --  334  --   HEPARINUNFRC  --   --   --   < > 0.28* 0.45 0.74* 0.69  CREATININE  --   --   --   --  0.75  --  1.00  --   TROPONINI 2.15*  --   --   --   --   --   --   --   < > = values in this interval not displayed.  Estimated Creatinine Clearance: 49.1 ml/min (by C-G formula based on Cr of 1).   Medications:  Heparin 1100 units/hr  Assessment: 78 y/o F on heparin for CP/ACS. Heparin level is at goal at 0.69 but at the very top of the goal range. Cardiology is planning on cardiac cath when pulmonary issues are improved.  Goal of Therapy:  Heparin level 0.3-0.7 units/ml Monitor platelets by anticoagulation protocol: Yes   Plan:  1. Decrease heparin to 1000 units/hr 2. F/u AM heparin level and CBC  Lysle Pearl, PharmD, BCPS Pager # [phone] 05/05/2013 3:17 PM

## 2013-05-05 NOTE — Progress Notes (Signed)
SUBJECTIVE:  She remains intubated.  She was awake this AM and answering questions.    PHYSICAL EXAM Filed Vitals:   05/05/13 0600 05/05/13 0700 05/05/13 0800 05/05/13 0804  BP: 145/44 137/63 125/57 125/57  Pulse: 84 81 68 78  Temp: 98.8 F (37.1 C) 98.7 F (37.1 C) 98.7 F (37.1 C) 98.7 F (37.1 C)  TempSrc:      Resp: 21 22 18 16   Height:      Weight:      SpO2: 100% 99% 99% 98%   General:  In[place]ed.  She opens her eyes and responds.  She is sedated Lungs:  Diffuse transmitted upper airway sounds with fine crackles Heart:  RRR Abdomen:  Positive bowel sounds, no rebound no guarding Extremities:  No edema Neuro:  Sedated  LABS: Lab Results  Component Value Date   TROPONINI 2.15* 05/02/2013   Results for orders placed during the hospital encounter of 05/01/13 (from the past 24 hour(s))  HEPARIN LEVEL (UNFRACTIONATED)     Status: None   Collection Time    05/04/13  9:47 AM      Result Value Range   Heparin Unfractionated 0.45  0.30 - 0.70 IU/mL  GLUCOSE, CAPILLARY     Status: Abnormal   Collection Time    05/04/13 11:54 AM      Result Value Range   Glucose-Capillary 266 (*) 70 - 99 mg/dL  GLUCOSE, CAPILLARY     Status: Abnormal   Collection Time    05/04/13  3:48 PM      Result Value Range   Glucose-Capillary 257 (*) 70 - 99 mg/dL   Comment 1 Documented in Chart     Comment 2 Notify RN    GLUCOSE, CAPILLARY     Status: Abnormal   Collection Time    05/04/13  8:15 PM      Result Value Range   Glucose-Capillary 237 (*) 70 - 99 mg/dL  GLUCOSE, CAPILLARY     Status: Abnormal   Collection Time    05/05/13 12:07 AM      Result Value Range   Glucose-Capillary 190 (*) 70 - 99 mg/dL   Comment 1 Documented in Chart     Comment 2 Notify RN    GLUCOSE, CAPILLARY     Status: Abnormal   Collection Time    05/05/13  4:09 AM      Result Value Range   Glucose-Capillary 159 (*) 70 - 99 mg/dL   Comment 1 Documented in Chart     Comment 2 Notify RN    HEPARIN LEVEL  (UNFRACTIONATED)     Status: Abnormal   Collection Time    05/05/13  5:00 AM      Result Value Range   Heparin Unfractionated 0.74 (*) 0.30 - 0.70 IU/mL  BASIC METABOLIC PANEL     Status: Abnormal   Collection Time    05/05/13  5:00 AM      Result Value Range   Sodium 142  137 - 147 mEq/L   Potassium 3.9  3.7 - 5.3 mEq/L   Chloride 98  96 - 112 mEq/L   CO2 30  19 - 32 mEq/L   Glucose, Bld 142 (*) 70 - 99 mg/dL   BUN 33 (*) 6 - 23 mg/dL   Creatinine, Ser 4.09  0.50 - 1.10 mg/dL   Calcium 8.0 (*) 8.4 - 10.5 mg/dL   GFR calc non Af Amer 53 (*) >90 mL/min   GFR calc Af Denyse Dago  62 (*) >90 mL/min  CBC WITH DIFFERENTIAL     Status: Abnormal   Collection Time    05/05/13  5:00 AM      Result Value Range   WBC 18.4 (*) 4.0 - 10.5 K/uL   RBC 4.36  3.87 - 5.11 MIL/uL   Hemoglobin 12.5  12.0 - 15.0 g/dL   HCT 16.1  09.6 - 04.5 %   MCV 88.1  78.0 - 100.0 fL   MCH 28.7  26.0 - 34.0 pg   MCHC 32.6  30.0 - 36.0 g/dL   RDW 40.9  81.1 - 91.4 %   Platelets 334  150 - 400 K/uL   Neutrophils Relative % 87 (*) 43 - 77 %   Neutro Abs 15.9 (*) 1.7 - 7.7 K/uL   Lymphocytes Relative 9 (*) 12 - 46 %   Lymphs Abs 1.6  0.7 - 4.0 K/uL   Monocytes Relative 5  3 - 12 %   Monocytes Absolute 0.8  0.1 - 1.0 K/uL   Eosinophils Relative 0  0 - 5 %   Eosinophils Absolute 0.0  0.0 - 0.7 K/uL   Basophils Relative 0  0 - 1 %   Basophils Absolute 0.0  0.0 - 0.1 K/uL  GLUCOSE, CAPILLARY     Status: Abnormal   Collection Time    05/05/13  8:08 AM      Result Value Range   Glucose-Capillary 106 (*) 70 - 99 mg/dL    Intake/Output Summary (Last 24 hours) at 05/05/13 0859 Last data filed at 05/05/13 0800  Gross per 24 hour  Intake 2060.23 ml  Output   4400 ml  Net -2339.77 ml    EKG:  As above.   ASSESSMENT AND PLAN:  Respiratory Failure:  Per CCM.  WBC trending down.   EF is 40 - 45% global.  Unclear if this is secondary to the acute illness or existed previously undiagnosed.   Continue diuresis as BP  allows.  Today she is going to get 80 IV x 2  NSTEMI (non-ST elevated myocardial infarction):    Initial plan was for a noninvasive evaluation.  However, I discussed with her daughter possible cath once pulmonary issues are improved.      HTN:  BP is elevated as sedation is reduced. However, it is somewhat labile.  Continue with prn hydralazine and beta blocker.   Fayrene Fearing [facility] 05/05/2013 8:59 AM

## 2013-05-06 ENCOUNTER — Inpatient Hospital Stay (HOSPITAL_COMMUNITY): Payer: Medicare Other

## 2013-05-06 LAB — BASIC METABOLIC PANEL
Calcium: 7.7 mg/dL — ABNORMAL LOW (ref 8.4–10.5)
GFR calc Af Amer: 53 mL/min — ABNORMAL LOW (ref >90)
GFR calc non Af Amer: 46 mL/min — ABNORMAL LOW (ref >90)
Glucose, Bld: 242 mg/dL — ABNORMAL HIGH (ref 70–99)
Potassium: 4 mEq/L (ref 3.7–5.3)
Sodium: 141 mEq/L (ref 137–147)

## 2013-05-06 LAB — CBC WITH DIFFERENTIAL/PLATELET
Eosinophils Relative: 0 % (ref 0–5)
HCT: 38.5 % (ref 36.0–46.0)
Hemoglobin: 12.1 g/dL (ref 12.0–15.0)
Lymphocytes Relative: 9 % — ABNORMAL LOW (ref 12–46)
Lymphs Abs: 1.4 10*3/uL (ref 0.7–4.0)
MCH: 28 pg (ref 26.0–34.0)
Monocytes Relative: 4 % (ref 3–12)
Neutro Abs: 14.5 10*3/uL — ABNORMAL HIGH (ref 1.7–7.7)
Neutrophils Relative %: 88 % — ABNORMAL HIGH (ref 43–77)
Platelets: 323 10*3/uL (ref 150–400)
RBC: 4.32 MIL/uL (ref 3.87–5.11)
WBC: 16.5 10*3/uL — ABNORMAL HIGH (ref 4.0–10.5)

## 2013-05-06 LAB — GLUCOSE, CAPILLARY
Glucose-Capillary: 119 mg/dL — ABNORMAL HIGH (ref 70–99)
Glucose-Capillary: 205 mg/dL — ABNORMAL HIGH (ref 70–99)
Glucose-Capillary: 316 mg/dL — ABNORMAL HIGH (ref 70–99)
Glucose-Capillary: 336 mg/dL — ABNORMAL HIGH (ref 70–99)

## 2013-05-06 LAB — HEPARIN LEVEL (UNFRACTIONATED): Heparin Unfractionated: 0.39 IU/mL (ref 0.30–0.70)

## 2013-05-06 MED ORDER — ALBUTEROL SULFATE (5 MG/ML) 0.5% IN NEBU
2.5000 mg | INHALATION_SOLUTION | Freq: Four times a day (QID) | RESPIRATORY_TRACT | Status: DC
  Filled 2013-05-06 (×4): qty 0.5

## 2013-05-06 MED ORDER — IPRATROPIUM BROMIDE 0.02 % IN SOLN: 0.5000 mg | Freq: Four times a day (QID) | RESPIRATORY_TRACT | Status: DC

## 2013-05-06 MED ORDER — FUROSEMIDE 10 MG/ML IJ SOLN
40.0000 mg | Freq: Every day | INTRAMUSCULAR | Status: DC
  Administered 2013-05-06 – 2013-05-11 (×6): 40 mg via INTRAVENOUS
  Filled 2013-05-06 (×7): qty 4

## 2013-05-06 MED ORDER — INSULIN GLARGINE 100 UNIT/ML ~~LOC~~ SOLN
10.0000 [IU] | SUBCUTANEOUS | Status: AC
  Administered 2013-05-06: 10 [IU] via SUBCUTANEOUS
  Filled 2013-05-06: qty 0.1

## 2013-05-06 MED ORDER — IPRATROPIUM-ALBUTEROL 0.5-2.5 (3) MG/3ML IN SOLN: 3.0000 mL | RESPIRATORY_TRACT | Status: DC

## 2013-05-06 MED ORDER — IPRATROPIUM BROMIDE 0.02 % IN SOLN: 0.5000 mg | RESPIRATORY_TRACT | Status: DC | PRN

## 2013-05-06 MED ORDER — ISOSORB DINITRATE-HYDRALAZINE 20-37.5 MG PO TABS
1.0000 | ORAL_TABLET | Freq: Two times a day (BID) | ORAL | Status: DC
  Administered 2013-05-06 – 2013-05-07 (×3): 1 via ORAL
  Filled 2013-05-06 (×4): qty 1

## 2013-05-06 MED ORDER — METHYLPREDNISOLONE SODIUM SUCC 40 MG IJ SOLR
40.0000 mg | Freq: Every day | INTRAMUSCULAR | Status: DC
  Administered 2013-05-07 – 2013-05-08 (×2): 40 mg via INTRAVENOUS
  Filled 2013-05-06 (×2): qty 1

## 2013-05-06 MED ORDER — IPRATROPIUM-ALBUTEROL 0.5-2.5 (3) MG/3ML IN SOLN
3.0000 mL | Freq: Four times a day (QID) | RESPIRATORY_TRACT | Status: DC
Start: 2013-05-06 — End: 2013-05-08
  Administered 2013-05-06 – 2013-05-08 (×9): 3 mL via RESPIRATORY_TRACT
  Filled 2013-05-06 (×37): qty 3

## 2013-05-06 MED ORDER — INSULIN GLARGINE 100 UNIT/ML ~~LOC~~ SOLN
30.0000 [IU] | Freq: Every day | SUBCUTANEOUS | Status: DC
  Administered 2013-05-07 – 2013-05-13 (×7): 30 [IU] via SUBCUTANEOUS
  Filled 2013-05-06 (×7): qty 0.3

## 2013-05-06 MED ORDER — ALBUTEROL SULFATE (5 MG/ML) 0.5% IN NEBU
2.5000 mg | INHALATION_SOLUTION | RESPIRATORY_TRACT | Status: DC | PRN
  Filled 2013-05-06: qty 0.5

## 2013-05-06 MED ORDER — HYDRALAZINE HCL 20 MG/ML IJ SOLN
10.0000 mg | Freq: Once | INTRAMUSCULAR | Status: AC
  Administered 2013-05-06: 10 mg via INTRAVENOUS

## 2013-05-06 NOTE — Progress Notes (Signed)
eLink Physician-Brief Progress Note Patient Name: Rodina Pinales DOB: 1935-01-10 MRN: [id number]  Date of Service  05/06/2013   HPI/Events of Note   Long discussion with Pt's daughter by phone. There was some confusion regarding the goals of care, the philosophy behind and reasons behind a one-way extubation. She had even told the nurse at one point that she would still want her mother supported aggressively and intubated. After our discussion I believe that she just needed to hear the situation, the rationale again because she is having understandable difficulty dealing with her mother's mortality. I also believe that she is fearful that her mother will not do well, is afraid to extubate. In the end, we agreed that one-way extubation was appropriate next step, that we would defer extubation now since it is 21:00 and the pt has been working most of the day. We will minimize sedation through the night and plan for extubation for hopeful success in the am. This will also allow the daughter to have more family and support present at the hospital   eICU Interventions     Intervention Category Major Interventions: End of life / care limitation discussion  Latisia Hilaire S. 05/06/2013, 9:02 PM

## 2013-05-06 NOTE — Progress Notes (Signed)
ANTICOAGULATION CONSULT NOTE - Follow Up Consult  Pharmacy Consult for Heparin  Indication: chest pain/ACS  No Known Allergies  Patient Measurements: Height: 5\' 2"  (157.5 cm) Weight: 189 lb 2.5 oz (85.8 kg) IBW/kg (Calculated) : 50.1 Heparin Dosing Weight: ~70 kg  Vital Signs: Temp: 99.1 F (37.3 C) (12/31 0900) Temp src: Core (Comment) (12/31 0400) BP: 168/67 mmHg (12/31 0900) Pulse Rate: 103 (12/31 0900)  Labs:  Recent Labs  05/04/13 0335  05/05/13 0500 05/05/13 1415 05/06/13 0443  HGB 11.5*  --  12.5  --  12.1  HCT 35.4*  --  38.4  --  38.5  PLT 329  --  334  --  323  HEPARINUNFRC 0.28*  < > 0.74* 0.69 0.39  CREATININE 0.75  --  1.00  --  1.14*  < > = values in this interval not displayed.  Estimated Creatinine Clearance: 42.7 ml/min (by C-G formula based on Cr of 1.14).  Medications:  Heparin 1100 units/hr  Assessment: 77 y/o F on heparin for CP/ACS. Heparin level is at goal at 0.39. Cardiology is planning on cardiac cath when pulmonary issues are improved. H/H/Plts are stable. No overt bleeding.   Goal of Therapy:  Heparin level 0.3-0.7 units/ml Monitor platelets by anticoagulation protocol: Yes   Plan:  1. Continue heparin to 1000 units/hr 2. F/u AM heparin level and CBC  Link Snuffer, PharmD, BCPS Clinical Pharmacist [phone]  05/06/2013 9:54 AM

## 2013-05-06 NOTE — Progress Notes (Signed)
PULMONARY  / CRITICAL CARE MEDICINE  Name: Brenda Hogan  MRN: [id number] DOB: 1934/12/10  ADMISSION DATE:  05/01/2013 CONSULTATION DATE:  05/01/2013  REFERRING MD :  EDP PRIMARY SERVICE: PCCM  CHIEF COMPLAINT:  Acute respiratory failure  BRIEF PATIENT DESCRIPTION: 78 yo with COPD, bed bound, found in respiratory distress, intubated by EMS. Troponin elevated upon arrival to ICU.  SIGNIFICANT EVENTS / STUDIES:  12/26  Head CT >>> neg May 28, 2023  TTE >>> 12/28 echo> LVEF 40-45%, LVH (mild), mild MR, IVC dilated  LINES / TUBES: OETT 12/26 >>> 12/28 OGT 12/26 >>> 12/28 Foley 12/26 >>> OETT 12/29 >>12/31 (one way)  CULTURES: 12/26  MRSA >>> neg 12/26  Blood >>> 12/26  Urine >>> 12/26 Trach aspirate >>> 12/26  Multoplex Virus PCR >>> influenza A H3  ANTIBIOTICS: Ceftriaxone 12/26 x1 Vancomycin 12/26 >>> 12/28 Levaquin 2023-05-28 >>>12/30 Tamiflu 12/26 >>>   INTERVAL HISTORY:   CXR wet, comfortable on vent, passing SBT   VITAL SIGNS: Temp:  [97.3 F (36.3 C)-99.1 F (37.3 C)] 99.1 F (37.3 C) (12/31 0900) Pulse Rate:  [69-114] 103 (12/31 0900) Cardiac Rhythm:  [-] Normal sinus rhythm (12/31 0400) Resp:  [13-26] 16 (12/31 0900) BP: (101-188)/(46-89) 168/67 mmHg (12/31 0900) SpO2:  [97 %-100 %] 98 % (12/31 0900) FiO2 (%):  [40 %] 40 % (12/31 0930) Weight:  [85.8 kg (189 lb 2.5 oz)] 85.8 kg (189 lb 2.5 oz) (12/31 0437)  VENTILATOR SETTINGS: Vent Mode:  [-] PRVC FiO2 (%):  [30 %] 30 % Set Rate:  [28 bmp] 28 bmp Vt Set:  [420 mL] 420 mL PEEP:  [5 cmH20] 5 cmH20 Plateau Pressure:  [4 cmH20-21 cmH20] 20 cmH20  INTAKE / OUTPUT: Intake/Output     12/30 0701 - 12/31 0700 12/31 0701 - 01/01 0700   I.V. (mL/kg) 1731.3 (20.2) 137.5 (1.6)   NG/GT 443 100   IV Piggyback 150    Total Intake(mL/kg) 2324.3 (27.1) 237.5 (2.8)   Urine (mL/kg/hr) 1870 (0.9) 50 (0.2)   Total Output 1870 50   Net +454.3 +187.5           PHYSICAL EXAMINATION: General:  Comfortable on  vent HEENT: NCAT, OP clear PULM: few wheezes, otherwise clear CV: Tachy, regular AB: BS+, soft, nontender Ext: trace edema in ankles Neuro: comfortable on vent  LABS: CBC  Recent Labs Lab 05/04/13 0335 05/05/13 0500 05/06/13 0443  WBC 21.1* 18.4* 16.5*  HGB 11.5* 12.5 12.1  HCT 35.4* 38.4 38.5  PLT 329 334 323   Coag's  Recent Labs Lab 05/01/13 1643 05/01/13 2150  APTT  --  30  INR 1.10 1.15   BMET  Recent Labs Lab 05/04/13 0335 05/05/13 0500 05/06/13 0443  NA 135 142 141  K 3.8 3.9 4.0  CL 102 98 98  CO2 24 30 28   BUN 23 33* 49*  CREATININE 0.75 1.00 1.14*  GLUCOSE 239* 142* 242*   Electrolytes  Recent Labs Lab 05/01/13 2150 May 27, 2013 0354 05/04/13 0335 05/05/13 0500 05/06/13 0443  CALCIUM 7.5* 7.4* 8.0* 8.0* 7.7*  MG 1.3* 3.4*  --   --   --   PHOS 4.4 3.6  --   --   --    Sepsis Markers  Recent Labs Lab 05/01/13 1729 05/01/13 1831 05/01/13 2158  LATICACIDVEN 3.42* 2.95* 2.4*  PROCALCITON  --   --  2.35   ABG  Recent Labs Lab 05/01/13 1723 May 27, 2013 0132 2013/05/27 0426  PHART 7.131* 7.317* 7.340*  PCO2ART 74.6* 43.9  42.6  PO2ART 462.0* 151.0* 115.0*   Liver Enzymes  Recent Labs Lab 05/01/13 1643 05/01/13 2150  AST 44* 55*  ALT 10 16  ALKPHOS 86 72  BILITOT 0.6 0.5  ALBUMIN 3.1* 2.6*   Cardiac Enzymes  Recent Labs Lab 05/01/13 1648 05/01/13 2158  05/02/13 1025 05/02/13 1450 05/02/13 2126  TROPONINI <0.30 1.30*  < > 2.80* 1.80* 2.15*  PROBNP  --  3477.0*  --   --   --   --   < > = values in this interval not displayed. Glucose  Recent Labs Lab 05/05/13 1108 05/05/13 1613 05/05/13 1916 05/05/13 2334 05/06/13 0353 05/06/13 0757  GLUCAP 166* 193* 155* 119* 205* 235*   CXR:  None today  ASSESSMENT / PLAN:  PULMONARY A:   Acute respiratory failure Severe AE COPD Influenza Likely flash pulm edema 12/29 AM P:   One way extubation 12/31 Bronchodilator scheduled and prn Wean  solumedrol  CARDIOVASCULAR A: NSTEMI > class II MI due to stress of illness; 12/28 echo LVEF 40-45% 12/29 > pulm edema from CHF exac P:  Needs LHC prior to discharge ASA, Lipitor Heparin per Pharmacy Metoprolol 5 q6h, convert to oral once able to tolerate diet Add nitrate/hydralazine combination pill Lasix daily  RENAL A:  Pre-renal azotemia 12/31 P:   Trend BMET Keep even  GASTROINTESTINAL A:  No active issues. P:   Pantoprazole for stress ulcer prophylaxis Swallow eval post extubation  HEMATOLOGIC A:  No active issues. P:  Trend CBC Heparin gtt for ACS per cardiology  INFECTIOUS A:  Influenza A H3 P:   Cont tamiflu  ENDOCRINE A:  Hyperglycemia. P:   SSI Lantus 30 Wean solumedrol  NEUROLOGIC A:  Acute encephalopathy -  P:   -fentanyl for vent synchrony -benzo for anxiety  Code status: Limited code, no re-intubation, need to discuss further details with family  I have personally obtained history, examined patient, evaluated and interpreted laboratory and imaging results, reviewed medical records, formulated assessment / plan and placed orders.  CRITICAL CARE:  The patient is critically ill with multiple organ systems failure and requires high complexity decision making for assessment and support, frequent evaluation and titration of therapies, application of advanced monitoring technologies and extensive interpretation of multiple databases. Critical Care Time devoted to patient care services described in this note is 35 minutes.   Yolonda Kida PCCM Pager: [phone] Cell: [phone] If no response, call [phone]   05/06/2013, 10:20 AM

## 2013-05-06 NOTE — Progress Notes (Signed)
PT Cancellation Note  Patient Details Name: Brenda Hogan MRN: [id number] DOB: 04-04-1935   Cancelled Treatment:    Reason Eval/Treat Not Completed: Medical issues which prohibited therapy.  Extubation scheduled for today.   Per RN, see tomorrow. 05/06/2013  [place] Bing, PT [phone]  (pager)   Thersa Mohiuddin, [name] 05/06/2013, 2:36 PM

## 2013-05-06 NOTE — Progress Notes (Signed)
Inpatient Diabetes Program Recommendations  AACE/ADA: New Consensus Statement on Inpatient Glycemic Control (2013)  Target Ranges:  Prepandial:   less than 140 mg/dL      Peak postprandial:   less than 180 mg/dL (1-2 hours)      Critically ill patients:  140 - 180 mg/dL   Noted pt off the ICU hyperglycemia protocol (possibly due to attempt to extubate?) Noted increase in lantus by 5 units. May want to add tube feed coverage, best using the protocol  Which would help to assess dosage needed for tube feeds. Moderate correction q 4 hrs may help to correct, but it will not cover the tube feeds. Could start with 4 units q 4hrs novolog to cover tube feeds if protocol is not resumed.  Thank you, Lenor Coffin, RN, CNS, Diabetes Coordinator [phone])

## 2013-05-06 NOTE — Progress Notes (Signed)
SUBJECTIVE:  She remains intubated.  However, the plan is for extubation today.  She is awake and denies any chest pain.   PHYSICAL EXAM Filed Vitals:   05/06/13 0800 05/06/13 0806 05/06/13 0900 05/06/13 1124  BP: 168/69  168/67   Pulse: 102  103   Temp: 99.1 F (37.3 C)  99.1 F (37.3 C)   TempSrc:      Resp: 21  16   Height:      Weight:      SpO2: 97% 100% 98% 98%   General:  Inbutabed.  No distress Lungs:  Diffuse transmitted upper airway sounds Heart:  RRR Abdomen:  Positive bowel sounds, no rebound no guarding Extremities:  No edema Neuro:  Sedated  LABS: Lab Results  Component Value Date   TROPONINI 2.15* 05/02/2013   Results for orders placed during the hospital encounter of 05/01/13 (from the past 24 hour(s))  HEPARIN LEVEL (UNFRACTIONATED)     Status: None   Collection Time    05/05/13  2:15 PM      Result Value Range   Heparin Unfractionated 0.69  0.30 - 0.70 IU/mL  GLUCOSE, CAPILLARY     Status: Abnormal   Collection Time    05/05/13  4:13 PM      Result Value Range   Glucose-Capillary 193 (*) 70 - 99 mg/dL  GLUCOSE, CAPILLARY     Status: Abnormal   Collection Time    05/05/13  7:16 PM      Result Value Range   Glucose-Capillary 155 (*) 70 - 99 mg/dL  GLUCOSE, CAPILLARY     Status: Abnormal   Collection Time    05/05/13 11:34 PM      Result Value Range   Glucose-Capillary 119 (*) 70 - 99 mg/dL   Comment 1 Notify RN    GLUCOSE, CAPILLARY     Status: Abnormal   Collection Time    05/06/13  3:53 AM      Result Value Range   Glucose-Capillary 205 (*) 70 - 99 mg/dL   Comment 1 Notify RN    HEPARIN LEVEL (UNFRACTIONATED)     Status: None   Collection Time    05/06/13  4:43 AM      Result Value Range   Heparin Unfractionated 0.39  0.30 - 0.70 IU/mL  BASIC METABOLIC PANEL     Status: Abnormal   Collection Time    05/06/13  4:43 AM      Result Value Range   Sodium 141  137 - 147 mEq/L   Potassium 4.0  3.7 - 5.3 mEq/L   Chloride 98  96 - 112  mEq/L   CO2 28  19 - 32 mEq/L   Glucose, Bld 242 (*) 70 - 99 mg/dL   BUN 49 (*) 6 - 23 mg/dL   Creatinine, Ser 1.61 (*) 0.50 - 1.10 mg/dL   Calcium 7.7 (*) 8.4 - 10.5 mg/dL   GFR calc non Af Amer 46 (*) >90 mL/min   GFR calc Af Amer 53 (*) >90 mL/min  CBC WITH DIFFERENTIAL     Status: Abnormal   Collection Time    05/06/13  4:43 AM      Result Value Range   WBC 16.5 (*) 4.0 - 10.5 K/uL   RBC 4.32  3.87 - 5.11 MIL/uL   Hemoglobin 12.1  12.0 - 15.0 g/dL   HCT 09.6  04.5 - 40.9 %   MCV 89.1  78.0 - 100.0 fL  MCH 28.0  26.0 - 34.0 pg   MCHC 31.4  30.0 - 36.0 g/dL   RDW 45.4  09.8 - 11.9 %   Platelets 323  150 - 400 K/uL   Neutrophils Relative % 88 (*) 43 - 77 %   Neutro Abs 14.5 (*) 1.7 - 7.7 K/uL   Lymphocytes Relative 9 (*) 12 - 46 %   Lymphs Abs 1.4  0.7 - 4.0 K/uL   Monocytes Relative 4  3 - 12 %   Monocytes Absolute 0.6  0.1 - 1.0 K/uL   Eosinophils Relative 0  0 - 5 %   Eosinophils Absolute 0.0  0.0 - 0.7 K/uL   Basophils Relative 0  0 - 1 %   Basophils Absolute 0.0  0.0 - 0.1 K/uL  GLUCOSE, CAPILLARY     Status: Abnormal   Collection Time    05/06/13  7:57 AM      Result Value Range   Glucose-Capillary 235 (*) 70 - 99 mg/dL  GLUCOSE, CAPILLARY     Status: Abnormal   Collection Time    05/06/13 11:50 AM      Result Value Range   Glucose-Capillary 287 (*) 70 - 99 mg/dL    Intake/Output Summary (Last 24 hours) at 05/06/13 1221 Last data filed at 05/06/13 1100  Gross per 24 hour  Intake 2291.83 ml  Output   1590 ml  Net 701.83 ml    EKG:  As above.   ASSESSMENT AND PLAN:  Respiratory Failure:  Per CCM.  WBC continues to trend down.   EF is 40 - 45% global.  Unclear if this is secondary to the acute illness or existed previously undiagnosed.   I would suggest low dose daily diuresis.  I agree that a big part of the picture is hypertensive urgency.  Her meds have been adjusted.  BP is better today.   NSTEMI (non-ST elevated myocardial infarction):    Initial  plan was for a noninvasive evaluation.  However, I discussed with her daughter possible cath once pulmonary issues are improved.      HTN:  BP is elevated as sedation is reduced. However, it is somewhat labile.  Continue with prn hydralazine and beta blocker.   Fayrene Fearing [facility] 05/06/2013 12:21 PM

## 2013-05-07 LAB — BASIC METABOLIC PANEL
BUN: 57 mg/dL — ABNORMAL HIGH (ref 6–23)
CO2: 32 mEq/L (ref 19–32)
Calcium: 7.7 mg/dL — ABNORMAL LOW (ref 8.4–10.5)
Chloride: 106 mEq/L (ref 96–112)
Creatinine, Ser: 0.9 mg/dL (ref 0.50–1.10)
GFR calc Af Amer: 70 mL/min — ABNORMAL LOW (ref >90)
GFR calc non Af Amer: 61 mL/min — ABNORMAL LOW (ref >90)
Glucose, Bld: 212 mg/dL — ABNORMAL HIGH (ref 70–99)
Potassium: 3.4 mEq/L — ABNORMAL LOW (ref 3.7–5.3)
Sodium: 149 mEq/L — ABNORMAL HIGH (ref 137–147)

## 2013-05-07 LAB — GLUCOSE, CAPILLARY
GLUCOSE-CAPILLARY: 141 mg/dL — AB (ref 70–99)
Glucose-Capillary: 146 mg/dL — ABNORMAL HIGH (ref 70–99)
Glucose-Capillary: 148 mg/dL — ABNORMAL HIGH (ref 70–99)
Glucose-Capillary: 204 mg/dL — ABNORMAL HIGH (ref 70–99)
Glucose-Capillary: 208 mg/dL — ABNORMAL HIGH (ref 70–99)
Glucose-Capillary: 271 mg/dL — ABNORMAL HIGH (ref 70–99)

## 2013-05-07 LAB — CULTURE, BLOOD (ROUTINE X 2)
Culture: NO GROWTH
Culture: NO GROWTH

## 2013-05-07 LAB — CBC WITH DIFFERENTIAL/PLATELET
Basophils Absolute: 0 10*3/uL (ref 0.0–0.1)
Basophils Relative: 0 % (ref 0–1)
Eosinophils Absolute: 0 10*3/uL (ref 0.0–0.7)
Eosinophils Relative: 0 % (ref 0–5)
HCT: 34.1 % — ABNORMAL LOW (ref 36.0–46.0)
Hemoglobin: 10.9 g/dL — ABNORMAL LOW (ref 12.0–15.0)
Lymphocytes Relative: 14 % (ref 12–46)
Lymphs Abs: 2.2 10*3/uL (ref 0.7–4.0)
MCH: 28.6 pg (ref 26.0–34.0)
MCHC: 32 g/dL (ref 30.0–36.0)
MCV: 89.5 fL (ref 78.0–100.0)
Monocytes Absolute: 1.7 10*3/uL — ABNORMAL HIGH (ref 0.1–1.0)
Monocytes Relative: 11 % (ref 3–12)
Neutro Abs: 12.1 10*3/uL — ABNORMAL HIGH (ref 1.7–7.7)
Neutrophils Relative %: 75 % (ref 43–77)
Platelets: 293 10*3/uL (ref 150–400)
RBC: 3.81 MIL/uL — ABNORMAL LOW (ref 3.87–5.11)
RDW: 14.4 % (ref 11.5–15.5)
WBC: 16.1 10*3/uL — ABNORMAL HIGH (ref 4.0–10.5)

## 2013-05-07 LAB — HEPARIN LEVEL (UNFRACTIONATED)
Heparin Unfractionated: 0.26 IU/mL — ABNORMAL LOW (ref 0.30–0.70)
Heparin Unfractionated: 0.71 IU/mL — ABNORMAL HIGH (ref 0.30–0.70)

## 2013-05-07 MED ORDER — ISOSORB DINITRATE-HYDRALAZINE 20-37.5 MG PO TABS
1.0000 | ORAL_TABLET | Freq: Three times a day (TID) | ORAL | Status: DC
  Administered 2013-05-07 – 2013-05-13 (×19): 1 via ORAL
  Filled 2013-05-07 (×21): qty 1

## 2013-05-07 MED ORDER — LORAZEPAM 2 MG/ML IJ SOLN
0.5000 mg | INTRAMUSCULAR | Status: DC | PRN
  Administered 2013-05-07: 0.5 mg via INTRAVENOUS
  Filled 2013-05-07: qty 1

## 2013-05-07 MED ORDER — WHITE PETROLATUM GEL
Status: AC
  Administered 2013-05-07: 0.2
  Filled 2013-05-07: qty 5

## 2013-05-07 MED ORDER — PHENOL 1.4 % MT LIQD
1.0000 | OROMUCOSAL | Status: DC | PRN
  Administered 2013-05-07: 1 via OROMUCOSAL
  Filled 2013-05-07: qty 177

## 2013-05-07 NOTE — Progress Notes (Signed)
17:32- Patient successfully passed nursing bedside swallow evaluation swallowing 90 mls of water, ice chips and applesauce without any coughing or other difficulty.

## 2013-05-07 NOTE — Progress Notes (Signed)
[facility] PCCM  Extubated, doing well  Lengthy conversation with daughter.  We will continue to try to support her mom as best as possible, but we will not have her go back on the ventilator, and will not perform CPR.  Code status: DNR  Jillyn Hidden PCCM Pager: [phone] Cell: [phone] If no response, call [phone]

## 2013-05-07 NOTE — Progress Notes (Signed)
SUBJECTIVE:  She was not extubated yesterday but this is the plan for today.  She denies any chest pain, neck or arm pain.    PHYSICAL EXAM Filed Vitals:   05/07/13 0400 05/07/13 0500 05/07/13 0600 05/07/13 0700  BP: 125/53 158/69 139/58 147/58  Pulse: 89 86 66 73  Temp: 99.3 F (37.4 C) 98.9 F (37.2 C) 98.8 F (37.1 C) 98.7 F (37.1 C)  TempSrc: Oral     Resp: 16 17 16 24   Height:      Weight:  189 lb 9.5 oz (86 kg)    SpO2: 98% 98% 99% 99%   General:  Inbutabed.  No distress Lungs:  Diffuse transmitted upper airway sounds Heart:  RRR Abdomen:  Positive bowel sounds, no rebound no guarding Extremities:  No edema Neuro:  Sedated  LABS: Lab Results  Component Value Date   TROPONINI 2.15* 05/02/2013   Results for orders placed during the hospital encounter of 05/01/13 (from the past 24 hour(s))  GLUCOSE, CAPILLARY     Status: Abnormal   Collection Time    05/06/13  7:57 AM      Result Value Range   Glucose-Capillary 235 (*) 70 - 99 mg/dL  GLUCOSE, CAPILLARY     Status: Abnormal   Collection Time    05/06/13 11:50 AM      Result Value Range   Glucose-Capillary 287 (*) 70 - 99 mg/dL  GLUCOSE, CAPILLARY     Status: Abnormal   Collection Time    05/06/13  4:21 PM      Result Value Range   Glucose-Capillary 336 (*) 70 - 99 mg/dL  GLUCOSE, CAPILLARY     Status: Abnormal   Collection Time    05/06/13  8:33 PM      Result Value Range   Glucose-Capillary 316 (*) 70 - 99 mg/dL  GLUCOSE, CAPILLARY     Status: Abnormal   Collection Time    05/07/13 12:14 AM      Result Value Range   Glucose-Capillary 271 (*) 70 - 99 mg/dL   Comment 1 Documented in Chart     Comment 2 Notify RN    GLUCOSE, CAPILLARY     Status: Abnormal   Collection Time    05/07/13  4:09 AM      Result Value Range   Glucose-Capillary 204 (*) 70 - 99 mg/dL   Comment 1 Documented in Chart     Comment 2 Notify RN    HEPARIN LEVEL (UNFRACTIONATED)     Status: Abnormal   Collection Time   05/07/13  4:45 AM      Result Value Range   Heparin Unfractionated 0.26 (*) 0.30 - 0.70 IU/mL  BASIC METABOLIC PANEL     Status: Abnormal   Collection Time    05/07/13  4:45 AM      Result Value Range   Sodium 149 (*) 137 - 147 mEq/L   Potassium 3.4 (*) 3.7 - 5.3 mEq/L   Chloride 106  96 - 112 mEq/L   CO2 32  19 - 32 mEq/L   Glucose, Bld 212 (*) 70 - 99 mg/dL   BUN 57 (*) 6 - 23 mg/dL   Creatinine, Ser 0.90  0.50 - 1.10 mg/dL   Calcium 7.7 (*) 8.4 - 10.5 mg/dL   GFR calc non Af Amer 61 (*) >90 mL/min   GFR calc Af Amer 70 (*) >90 mL/min  CBC WITH DIFFERENTIAL     Status: Abnormal  Collection Time    05/07/13  4:45 AM      Result Value Range   WBC 16.1 (*) 4.0 - 10.5 K/uL   RBC 3.81 (*) 3.87 - 5.11 MIL/uL   Hemoglobin 10.9 (*) 12.0 - 15.0 g/dL   HCT 34.1 (*) 36.0 - 46.0 %   MCV 89.5  78.0 - 100.0 fL   MCH 28.6  26.0 - 34.0 pg   MCHC 32.0  30.0 - 36.0 g/dL   RDW 14.4  11.5 - 15.5 %   Platelets 293  150 - 400 K/uL   Neutrophils Relative % 75  43 - 77 %   Neutro Abs 12.1 (*) 1.7 - 7.7 K/uL   Lymphocytes Relative 14  12 - 46 %   Lymphs Abs 2.2  0.7 - 4.0 K/uL   Monocytes Relative 11  3 - 12 %   Monocytes Absolute 1.7 (*) 0.1 - 1.0 K/uL   Eosinophils Relative 0  0 - 5 %   Eosinophils Absolute 0.0  0.0 - 0.7 K/uL   Basophils Relative 0  0 - 1 %   Basophils Absolute 0.0  0.0 - 0.1 K/uL    Intake/Output Summary (Last 24 hours) at 05/07/13 0743 Last data filed at 05/07/13 0600  Gross per 24 hour  Intake 2547.5 ml  Output   1665 ml  Net  882.5 ml     ASSESSMENT AND PLAN:  Respiratory Failure:  Per CCM.  WBC continues to trend down.   EF is 40 - 45% global.  Unclear if this is secondary to the acute illness or existed previously undiagnosed.   I started low dose diuretic yesterday but she is net positive.  Continue current diuretic with prn IV dosing as needed.   NSTEMI (non-ST elevated myocardial infarction):    Initial plan was for a noninvasive evaluation.  However, I  would consider cath if she has significant recovery from her acute illnesses.     HTN:  BP is much improved on current meds but spikes with agitation.  I will increase the Bidil to tid.   Jeneen Rinks [facility] 05/07/2013 7:43 AM

## 2013-05-07 NOTE — Progress Notes (Signed)
PT Cancellation Note  Patient Details Name: Brenda Hogan MRN: [id number] DOB: February 11, 1935   Cancelled Treatment:    Reason Eval/Treat Not Completed: Medical issues which prohibited therapy.  Pt still hasn't been extubated, but hopefully today.  Expect she will not be ready immediately post extubation, so will try again tomorrow (1/2). 05/07/2013  Donnella Sham, [place] [phone]  (pager)   [place] Schreiner, [name] 05/07/2013, 10:46 AM

## 2013-05-07 NOTE — Progress Notes (Signed)
ANTICOAGULATION CONSULT NOTE - Follow Up Consult  Pharmacy Consult for Heparin  Indication: chest pain/ACS  No Known Allergies  Patient Measurements: Height: 5\' 2"  (157.5 cm) Weight: 189 lb 9.5 oz (86 kg) IBW/kg (Calculated) : 50.1 Heparin Dosing Weight: ~70 kg  Vital Signs: Temp: 98.7 F (37.1 C) (01/01 0700) Temp src: Oral (01/01 0400) BP: 147/58 mmHg (01/01 0700) Pulse Rate: 73 (01/01 0700)  Labs:  Recent Labs  05/05/13 0500 05/05/13 1415 05/06/13 0443 05/07/13 0445  HGB 12.5  --  12.1 10.9*  HCT 38.4  --  38.5 34.1*  PLT 334  --  323 293  HEPARINUNFRC 0.74* 0.69 0.39 0.26*  CREATININE 1.00  --  1.14* 0.90    Estimated Creatinine Clearance: 54.1 ml/min (by C-G formula based on Cr of 0.9).  Medications:  Heparin 1000 units/hr  Assessment: 79 y/o F on heparin for CP/ACS. Heparin level is now slightly subtherapeutic. Cardiology is planning on cardiac cath when pulmonary issues are improved. H/H is down today but plts remain WNL.  Goal of Therapy:  Heparin level 0.3-0.7 units/ml Monitor platelets by anticoagulation protocol: Yes   Plan:  1. Increase heparin gtt to 1100 units/hr 2. Check an 8 hour heparin level 3. Continue daily heparin level and CBC  Salome Arnt, PharmD, BCPS Pager # [phone] 05/07/2013 8:12 AM

## 2013-05-07 NOTE — Progress Notes (Signed)
ANTICOAGULATION CONSULT NOTE - Follow Up Consult  Pharmacy Consult for Heparin Indication: NSTEMI  No Known Allergies  Patient Measurements: Height: 5\' 2"  (157.5 cm) Weight: 189 lb 9.5 oz (86 kg) IBW/kg (Calculated) : 50.1 Heparin Dosing Weight: 70kg  Vital Signs: Temp: 98.7 F (37.1 C) (01/01 1500) BP: 161/69 mmHg (01/01 1500) Pulse Rate: 95 (01/01 1500)  Labs:  Recent Labs  05/05/13 0500  05/06/13 0443 05/07/13 0445 05/07/13 1715  HGB 12.5  --  12.1 10.9*  --   HCT 38.4  --  38.5 34.1*  --   PLT 334  --  323 293  --   HEPARINUNFRC 0.74*  < > 0.39 0.26* 0.71*  CREATININE 1.00  --  1.14* 0.90  --   < > = values in this interval not displayed.  Estimated Creatinine Clearance: 54.1 ml/min (by C-G formula based on Cr of 0.9).   Medications:  Heparin 1100 units/hr  Assessment: 78yof on heparin for NSTEMI. Heparin level (0.71) is just above goal range - will decrease rate slightly and follow-up AM heparin level. - H/H trended down, Plts stable - No bleeding reported  Goal of Therapy:  Heparin level 0.3-0.7 units/ml Monitor platelets by anticoagulation protocol: Yes   Plan:  1. Decrease heparin drip to 1050 units/hr (10.5 ml/hr) 2. Follow-up AM heparin level  Earleen Newport [phone] 05/07/2013,6:05 PM

## 2013-05-07 NOTE — Procedures (Signed)
Extubation Procedure Note  Patient Details:   Name: Brenda Hogan DOB: 1935-02-05 MRN: [id number]   Airway Documentation:     Evaluation  O2 sats: stable throughout Complications: No apparent complications Patient did tolerate procedure well. Bilateral Breath Sounds: Rhonchi Suctioning: Airway Yes  Carson Myrtle 05/07/2013, 11:23 AM

## 2013-05-07 NOTE — Progress Notes (Signed)
PULMONARY  / CRITICAL CARE MEDICINE  Name: Brenda Hogan  MRN: [id number] DOB: Sep 29, 1934  ADMISSION DATE:  05/01/2013 CONSULTATION DATE:  05/01/2013  REFERRING MD :  EDP PRIMARY SERVICE: PCCM  CHIEF COMPLAINT:  Acute respiratory failure  BRIEF PATIENT DESCRIPTION: 79 yo with COPD, bed bound, found in respiratory distress, intubated by EMS. Troponin elevated upon arrival to ICU.  SIGNIFICANT EVENTS / STUDIES:  12/26  Head CT >>> neg 05-26-2023  TTE >>> 12/28 echo> LVEF 40-45%, LVH (mild), mild MR, IVC dilated  LINES / TUBES: OETT 12/26 >>> 12/28 OGT 12/26 >>> 12/28 Foley 12/26 >>> OETT 12/29 >>12/31 (one way)  CULTURES: 12/26  MRSA >>> neg 12/26  Blood >>> 12/26  Urine >>> 12/26 Trach aspirate >>> 12/26  Multoplex Virus PCR >>> influenza A H3  ANTIBIOTICS: Ceftriaxone 12/26 x1 Vancomycin 12/26 >>> 12/28 Levaquin 2023/05/26 >>>12/30 Tamiflu 12/26 >>>   INTERVAL HISTORY:  Daughter came yesterday afternoon, had long conversation with EICU physician regarding situation, decided to hold off on extubation until this morning.  Currently comfortable on SBT   VITAL SIGNS: Temp:  [98.6 F (37 C)-99.9 F (37.7 C)] 98.6 F (37 C) (01/01 0800) Pulse Rate:  [66-121] 101 (01/01 0933) Cardiac Rhythm:  [-] Normal sinus rhythm (01/01 0400) Resp:  [8-24] 13 (01/01 0933) BP: (125-211)/(42-126) 211/126 mmHg (01/01 0936) SpO2:  [98 %-99 %] 99 % (01/01 0933) FiO2 (%):  [40 %] 40 % (01/01 0933) Weight:  [86 kg (189 lb 9.5 oz)] 86 kg (189 lb 9.5 oz) (01/01 0500)  VENTILATOR SETTINGS: Vent Mode:  [-] PRVC FiO2 (%):  [30 %] 30 % Set Rate:  [28 bmp] 28 bmp Vt Set:  [420 mL] 420 mL PEEP:  [5 cmH20] 5 cmH20 Plateau Pressure:  [4 YQI34-74 cmH20] 20 cmH20  INTAKE / OUTPUT: Intake/Output     12/31 0701 - 01/01 0700 01/01 0701 - 01/02 0700   I.V. (mL/kg) 1457.5 (16.9) 20 (0.2)   NG/GT 1200 100   IV Piggyback     Total Intake(mL/kg) 2657.5 (30.9) 120 (1.4)   Urine (mL/kg/hr) 1665  (0.8) 172 (0.7)   Total Output 1665 172   Net +992.5 -52           PHYSICAL EXAMINATION: General:  Anxious on vent HEENT: NCAT, OP clear PULM: Rhonchi R > L CV: Tachy, regular AB: BS+, soft, nontender Ext: trace edema in ankles Neuro: comfortable on vent  LABS: CBC  Recent Labs Lab 05/05/13 0500 05/06/13 0443 05/07/13 0445  WBC 18.4* 16.5* 16.1*  HGB 12.5 12.1 10.9*  HCT 38.4 38.5 34.1*  PLT 334 323 293   Coag's  Recent Labs Lab 05/01/13 1643 05/01/13 2150  APTT  --  30  INR 1.10 1.15   BMET  Recent Labs Lab 05/05/13 0500 05/06/13 0443 05/07/13 0445  NA 142 141 149*  K 3.9 4.0 3.4*  CL 98 98 106  CO2 30 28 32  BUN 33* 49* 57*  CREATININE 1.00 1.14* 0.90  GLUCOSE 142* 242* 212*   Electrolytes  Recent Labs Lab 05/01/13 2150 05-25-2013 0354  05/05/13 0500 05/06/13 0443 05/07/13 0445  CALCIUM 7.5* 7.4*  < > 8.0* 7.7* 7.7*  MG 1.3* 3.4*  --   --   --   --   PHOS 4.4 3.6  --   --   --   --   < > = values in this interval not displayed. Sepsis Markers  Recent Labs Lab 05/01/13 1729 05/01/13 1831 05/01/13 2158  LATICACIDVEN 3.42* 2.95* 2.4*  PROCALCITON  --   --  2.35   ABG  Recent Labs Lab 05/01/13 1723 05/02/13 0132 05/02/13 0426  PHART 7.131* 7.317* 7.340*  PCO2ART 74.6* 43.9 42.6  PO2ART 462.0* 151.0* 115.0*   Liver Enzymes  Recent Labs Lab 05/01/13 1643 05/01/13 2150  AST 44* 55*  ALT 10 16  ALKPHOS 86 72  BILITOT 0.6 0.5  ALBUMIN 3.1* 2.6*   Cardiac Enzymes  Recent Labs Lab 05/01/13 1648 05/01/13 2158  05/02/13 1025 05/02/13 1450 05/02/13 2126  TROPONINI <0.30 1.30*  < > 2.80* 1.80* 2.15*  PROBNP  --  3477.0*  --   --   --   --   < > = values in this interval not displayed. Glucose  Recent Labs Lab 05/06/13 1150 05/06/13 1621 05/06/13 2033 05/07/13 0014 05/07/13 0409 05/07/13 0756  GLUCAP 287* 336* 316* 271* 204* 148*   CXR:  None today  ASSESSMENT / PLAN:  PULMONARY A:   Acute respiratory  failure Severe AE COPD Influenza Likely flash pulm edema 12/29 AM P:   Extubation after daughter arrives Bronchodilator scheduled and prn Wean solumedrol  CARDIOVASCULAR A:  NSTEMI > class II MI due to stress of illness; 12/28 echo LVEF 40-45% 12/29 > pulm edema from CHF exac Hypertension> much worse with anxiety P:  Needs LHC prior to discharge ASA, Lipitor Heparin per Pharmacy Metoprolol 5 q6h, convert to oral once able to tolerate diet BP control per cardiology Treat anxiety (contributes to hypertension) Lasix daily  RENAL A:  Pre-renal azotemia  P:   Trend BMET Keep even  GASTROINTESTINAL A:  No active issues. P:   Pantoprazole for stress ulcer prophylaxis Swallow eval post extubation today  HEMATOLOGIC A:  No active issues. P:  Trend CBC Heparin gtt for ACS per cardiology  INFECTIOUS A:  Influenza A H3 P:   Cont tamiflu  ENDOCRINE A:  Hyperglycemia. P:   SSI Lantus 30 Wean solumedrol  NEUROLOGIC A:  Acute encephalopathy -  P:   -fentanyl for vent synchrony -benzo for anxiety  Code status: patient does not want to be re-intubated, discuss further with family today when they arrive  I have personally obtained history, examined patient, evaluated and interpreted laboratory and imaging results, reviewed medical records, formulated assessment / plan and placed orders.  CRITICAL CARE:  The patient is critically ill with multiple organ systems failure and requires high complexity decision making for assessment and support, frequent evaluation and titration of therapies, application of advanced monitoring technologies and extensive interpretation of multiple databases. Critical Care Time devoted to patient care services described in this note is 35 minutes.   Jillyn Hidden PCCM Pager: [phone] Cell: [phone] If no response, call [phone]   05/07/2013, 9:50 AM

## 2013-05-08 DIAGNOSIS — M7989 Other specified soft tissue disorders: Secondary | ICD-10-CM

## 2013-05-08 DIAGNOSIS — J441 Chronic obstructive pulmonary disease with (acute) exacerbation: Secondary | ICD-10-CM

## 2013-05-08 LAB — CBC WITH DIFFERENTIAL/PLATELET
Basophils Absolute: 0 10*3/uL (ref 0.0–0.1)
Basophils Relative: 0 % (ref 0–1)
EOS PCT: 0 % (ref 0–5)
Eosinophils Absolute: 0 10*3/uL (ref 0.0–0.7)
HEMATOCRIT: 35.2 % — AB (ref 36.0–46.0)
HEMOGLOBIN: 11.3 g/dL — AB (ref 12.0–15.0)
LYMPHS ABS: 3.4 10*3/uL (ref 0.7–4.0)
LYMPHS PCT: 19 % (ref 12–46)
MCH: 28.3 pg (ref 26.0–34.0)
MCHC: 32.1 g/dL (ref 30.0–36.0)
MCV: 88.2 fL (ref 78.0–100.0)
MONO ABS: 1.3 10*3/uL — AB (ref 0.1–1.0)
MONOS PCT: 8 % (ref 3–12)
NEUTROS ABS: 12.6 10*3/uL — AB (ref 1.7–7.7)
Neutrophils Relative %: 73 % (ref 43–77)
Platelets: 308 10*3/uL (ref 150–400)
RBC: 3.99 MIL/uL (ref 3.87–5.11)
RDW: 14.4 % (ref 11.5–15.5)
WBC: 17.3 10*3/uL — AB (ref 4.0–10.5)

## 2013-05-08 LAB — CULTURE, BLOOD (ROUTINE X 2)
Culture: NO GROWTH
Culture: NO GROWTH

## 2013-05-08 LAB — GLUCOSE, CAPILLARY
GLUCOSE-CAPILLARY: 108 mg/dL — AB (ref 70–99)
GLUCOSE-CAPILLARY: 145 mg/dL — AB (ref 70–99)
GLUCOSE-CAPILLARY: 161 mg/dL — AB (ref 70–99)
GLUCOSE-CAPILLARY: 98 mg/dL (ref 70–99)
Glucose-Capillary: 129 mg/dL — ABNORMAL HIGH (ref 70–99)
Glucose-Capillary: 125 mg/dL — ABNORMAL HIGH (ref 70–99)

## 2013-05-08 LAB — BASIC METABOLIC PANEL
BUN: 46 mg/dL — ABNORMAL HIGH (ref 6–23)
CO2: 32 meq/L (ref 19–32)
CREATININE: 0.73 mg/dL (ref 0.50–1.10)
Calcium: 7.9 mg/dL — ABNORMAL LOW (ref 8.4–10.5)
Chloride: 105 mEq/L (ref 96–112)
GFR calc non Af Amer: 81 mL/min — ABNORMAL LOW (ref >90)
Glucose, Bld: 117 mg/dL — ABNORMAL HIGH (ref 70–99)
POTASSIUM: 3.7 meq/L (ref 3.7–5.3)
Sodium: 149 mEq/L — ABNORMAL HIGH (ref 137–147)

## 2013-05-08 LAB — HEPARIN LEVEL (UNFRACTIONATED): HEPARIN UNFRACTIONATED: 0.64 [IU]/mL (ref 0.30–0.70)

## 2013-05-08 MED ORDER — IPRATROPIUM-ALBUTEROL 0.5-2.5 (3) MG/3ML IN SOLN
3.0000 mL | Freq: Three times a day (TID) | RESPIRATORY_TRACT | Status: DC
  Administered 2013-05-09 – 2013-05-10 (×5): 3 mL via RESPIRATORY_TRACT
  Filled 2013-05-08 (×5): qty 3

## 2013-05-08 MED ORDER — INSULIN ASPART 100 UNIT/ML ~~LOC~~ SOLN
0.0000 [IU] | Freq: Every day | SUBCUTANEOUS | Status: DC
  Administered 2013-05-09 – 2013-05-12 (×4): 2 [IU] via SUBCUTANEOUS

## 2013-05-08 MED ORDER — METOPROLOL TARTRATE 25 MG PO TABS
25.0000 mg | ORAL_TABLET | Freq: Two times a day (BID) | ORAL | Status: DC
Start: 2013-05-08 — End: 2013-05-12
  Administered 2013-05-08 – 2013-05-11 (×7): 25 mg via ORAL
  Filled 2013-05-08 (×9): qty 1

## 2013-05-08 MED ORDER — INSULIN ASPART 100 UNIT/ML ~~LOC~~ SOLN
0.0000 [IU] | Freq: Three times a day (TID) | SUBCUTANEOUS | Status: DC
  Administered 2013-05-08: 2 [IU] via SUBCUTANEOUS
  Administered 2013-05-08 – 2013-05-09 (×3): 1 [IU] via SUBCUTANEOUS
  Administered 2013-05-10: 3 [IU] via SUBCUTANEOUS
  Administered 2013-05-11: 1 [IU] via SUBCUTANEOUS
  Administered 2013-05-11: 3 [IU] via SUBCUTANEOUS
  Administered 2013-05-12: 5 [IU] via SUBCUTANEOUS
  Administered 2013-05-12: 3 [IU] via SUBCUTANEOUS
  Administered 2013-05-12: 2 [IU] via SUBCUTANEOUS
  Administered 2013-05-13: 1 [IU] via SUBCUTANEOUS
  Administered 2013-05-13: 5 [IU] via SUBCUTANEOUS
  Administered 2013-05-13: 3 [IU] via SUBCUTANEOUS

## 2013-05-08 MED ORDER — PREDNISONE 20 MG PO TABS
40.0000 mg | ORAL_TABLET | Freq: Every day | ORAL | Status: DC
  Administered 2013-05-09 – 2013-05-11 (×3): 40 mg via ORAL
  Filled 2013-05-08 (×4): qty 2

## 2013-05-08 NOTE — Progress Notes (Signed)
PULMONARY  / CRITICAL CARE MEDICINE  Name: Brenda Hogan  MRN: [id number] DOB: 1934-07-21  ADMISSION DATE:  05/01/2013 CONSULTATION DATE:  05/01/2013  REFERRING MD :  EDP PRIMARY SERVICE: PCCM  CHIEF COMPLAINT:  Acute respiratory failure  BRIEF PATIENT DESCRIPTION: 79 yo with COPD, bed bound, found in respiratory distress, intubated by EMS. Troponin elevated upon arrival to ICU.  SIGNIFICANT EVENTS / STUDIES:  12/26  Head CT >>> neg 2023/05/18 echo> LVEF 40-45%, LVH (mild), mild MR, IVC dilated  LINES / TUBES: OETT 12/26 >>> May 18, 2023 OGT 12/26 >>> May 18, 2023 Foley 12/26 >>> OETT 12/29 >>12/31 (one way)  CULTURES: 12/26  MRSA >>> neg 12/26  Blood >>>NGTD 12/26  Urine >>>neg 12/26 Trach aspirate >>>neg 12/26  Multoplex Virus PCR >>> influenza A H3  ANTIBIOTICS: Ceftriaxone 12/26 x1 Vancomycin 12/26 >>> May 18, 2023 Levaquin 12/27 >>>12/30 Tamiflu 12/26 >>> 12/31  INTERVAL HISTORY: Patient extubated yesterday.  After discussion with family, patient is now DNR.  The patient passed her swallow study yesterday, and is eating small amounts without difficulty today.  VITAL SIGNS: Temp:  [97.1 F (36.2 C)-98.7 F (37.1 C)] 97.5 F (36.4 C) (01/02 0900) Pulse Rate:  [62-100] 84 (01/02 0900) Cardiac Rhythm:  [-] Normal sinus rhythm (01/01 2000) Resp:  [10-24] 12 (01/02 0900) BP: (127-161)/(48-123) 155/72 mmHg (01/02 0900) SpO2:  [89 %-99 %] 94 % (01/02 0900) FiO2 (%):  [50 %] 50 % (01/02 0810) Weight:  [189 lb 2.5 oz (85.8 kg)] 189 lb 2.5 oz (85.8 kg) (01/02 0229)   INTAKE / OUTPUT: Intake/Output     01/01 0701 - 01/02 0700 01/02 0701 - 01/03 0700   P.O. 480    I.V. (mL/kg) 1345.2 (15.7) 121 (1.4)   NG/GT 122.5    Total Intake(mL/kg) 1947.7 (22.7) 121 (1.4)   Urine (mL/kg/hr) 2482 (1.2) 350 (0.7)   Total Output 2482 350   Net -534.4 -229           PHYSICAL EXAMINATION: General:  Anxious on vent HEENT: NCAT, OP clear PULM: Rhonchi R > L CV: Tachy, regular AB: BS+,  soft, nontender Ext: trace edema in ankles Neuro: comfortable on vent  General: alert, cooperative, and in no apparent distress HEENT: pupils equal round and reactive to light, vision grossly intact, oropharynx clear and non-erythematous  Neck: supple Lungs: clear to ascultation bilaterally, normal work of respiration, no wheezes, rales, ronchi Heart: regular rate and rhythm, no murmurs, gallops, or rubs Abdomen: soft, non-tender, non-distended, normal bowel sounds Extremities: 2+ DP/PT pulses bilaterally, no cyanosis, clubbing, or edema Neurologic: alert & oriented X3, cranial nerves II-XII intact, strength grossly intact, sensation intact to light touch   LABS: CBC  Recent Labs Lab 05/06/13 0443 05/07/13 0445 05/08/13 0415  WBC 16.5* 16.1* 17.3*  HGB 12.1 10.9* 11.3*  HCT 38.5 34.1* 35.2*  PLT 323 293 308   Coag's  Recent Labs Lab 05/01/13 1643 05/01/13 2150  APTT  --  30  INR 1.10 1.15   BMET  Recent Labs Lab 05/06/13 0443 05/07/13 0445 05/08/13 0415  NA 141 149* 149*  K 4.0 3.4* 3.7  CL 98 106 105  CO2 28 32 32  BUN 49* 57* 46*  CREATININE 1.14* 0.90 0.73  GLUCOSE 242* 212* 117*   Electrolytes  Recent Labs Lab 05/01/13 2150 05/02/13 0354  05/06/13 0443 05/07/13 0445 05/08/13 0415  CALCIUM 7.5* 7.4*  < > 7.7* 7.7* 7.9*  MG 1.3* 3.4*  --   --   --   --  PHOS 4.4 3.6  --   --   --   --   < > = values in this interval not displayed. Sepsis Markers  Recent Labs Lab 05/01/13 1729 05/01/13 1831 05/01/13 2158  LATICACIDVEN 3.42* 2.95* 2.4*  PROCALCITON  --   --  2.35   ABG  Recent Labs Lab 05/01/13 1723 05/02/13 0132 05/02/13 0426  PHART 7.131* 7.317* 7.340*  PCO2ART 74.6* 43.9 42.6  PO2ART 462.0* 151.0* 115.0*   Liver Enzymes  Recent Labs Lab 05/01/13 1643 05/01/13 2150  AST 44* 55*  ALT 10 16  ALKPHOS 86 72  BILITOT 0.6 0.5  ALBUMIN 3.1* 2.6*   Cardiac Enzymes  Recent Labs Lab 05/01/13 1648 05/01/13 2158   05/02/13 1025 05/02/13 1450 05/02/13 2126  TROPONINI <0.30 1.30*  < > 2.80* 1.80* 2.15*  PROBNP  --  3477.0*  --   --   --   --   < > = values in this interval not displayed. Glucose  Recent Labs Lab 05/07/13 1602 05/07/13 2020 05/08/13 0005 05/08/13 0422 05/08/13 0759 05/08/13 1240  GLUCAP 146* 208* 145* 108* 98 161*   CXR:  None today  ASSESSMENT / PLAN:  PULMONARY A:   Acute respiratory failure - extubated 1/1 Severe AE COPD Influenza A Likely flash pulm edema 12/29 AM P:   -continue duonebs q6 scheduled and q2 prn -transition from solu-medrol to PO prednisone 40 daily, then wean  CARDIOVASCULAR A:  NSTEMI > class II MI due to stress of illness; 12/28 echo LVEF 40-45% 12/29 > pulm edema from CHF exac Hypertension P:  -Cards on board, considering LHC prior to discharge -continue ASA, Lipitor -continue heparin drip per Pharmacy -convert metoprolol 5 mg IV q6 to 25 mg PO BID -Treat anxiety (contributes to hypertension) -Lasix daily  RENAL A:  Pre-renal azotemia - Resolved P:   -Trend BMET -currently euvolemic  GASTROINTESTINAL A:  No active issues. P:   -continue carb mod diet -d/c protonix, now that pt is extubated  HEMATOLOGIC A:  No active issues. P:  -Trend CBC -Heparin gtt for ACS per cardiology  INFECTIOUS A:  Influenza A H3 P:   -completed course of tamiflu  ENDOCRINE A:  Hyperglycemia. P:   -SSI -Lantus 30  NEUROLOGIC A:  Acute encephalopathy - resolved P:   -ativan prn for anxiety  Code status: patient is DNR, and does not want re-intubation or CPR  Dispo: Patient stable for transfer to tele today.  Discussed with [facility], who will assume care tomorrow.   Elnora Morrison, PGY3 Pager: [phone]  05/08/2013, 1:12 PM   Attending:  I have seen and examined the patient with nurse practitioner/resident and agree with the note above.   Jillyn Hidden PCCM Pager: [phone] Cell: [phone] If no response, call  [phone]

## 2013-05-08 NOTE — Progress Notes (Addendum)
VASCULAR LAB PRELIMINARY  PRELIMINARY  PRELIMINARY  PRELIMINARY  Left upper extremity venous duplex completed.    Preliminary report:  Left :  No evidence of DVT or superficial thrombosis.  Doppler waveforms are pulsitile consistent with congestive heart failure or fluid overload.  Brenda Hogan, RVS 05/08/2013, 12:11 PM

## 2013-05-08 NOTE — Progress Notes (Signed)
ANTICOAGULATION CONSULT NOTE - Follow Up Consult  Pharmacy Consult for Heparin Indication: NSTEMI  No Known Allergies  Patient Measurements: Height: 5\' 2"  (157.5 cm) Weight: 189 lb 2.5 oz (85.8 kg) IBW/kg (Calculated) : 50.1 Heparin Dosing Weight: 70kg  Vital Signs: Temp: 97.5 F (36.4 C) (01/02 0600) BP: 149/68 mmHg (01/02 0600) Pulse Rate: 80 (01/02 0600)  Labs:  Recent Labs  05/06/13 0443 05/07/13 0445 05/07/13 1715 05/08/13 0415  HGB 12.1 10.9*  --  11.3*  HCT 38.5 34.1*  --  35.2*  PLT 323 293  --  308  HEPARINUNFRC 0.39 0.26* 0.71* 0.64  CREATININE 1.14* 0.90  --  0.73    Estimated Creatinine Clearance: 60.8 ml/min (by C-G formula based on Cr of 0.73).  Medications:  Heparin 1050 units/hr  Assessment: 10 yof continues on IV heparin for NSTEMI. Heparin level is at goal at 0.64. CBC is stable. Patient heparin levels have been high on 1100 units/hr and low at 1000 units/hr. So even though heparin level is at the upper end of the goal range, will leave at current rate of 1050 units/hr. No bleeding noted.   Goal of Therapy:  Heparin level 0.3-0.7 units/ml Monitor platelets by anticoagulation protocol: Yes   Plan:  1. Continue heparin gtt at 1050 units/hr 2. F/u AM heparin level and CBC 3. F/u cath plans  Salome Arnt, PharmD, BCPS Pager # [phone] 05/08/2013 8:24 AM

## 2013-05-08 NOTE — Progress Notes (Signed)
SUBJECTIVE:   She is weak but no acute distress  PHYSICAL EXAM Filed Vitals:   05/08/13 0810 05/08/13 0900 05/08/13 1200 05/08/13 1300  BP:  155/72  162/61  Pulse:  84 82 88  Temp:  97.5 F (36.4 C) 97.9 F (36.6 C)   TempSrc:      Resp:  12 12 18   Height:      Weight:      SpO2: 98% 94% 92% 94%   General:  No distress Lungs:  Decreased breath sounds bilaterally.  Heart:  RRR Abdomen:  Positive bowel sounds, no rebound no guarding Extremities:  No edema   LABS:  Results for orders placed during the hospital encounter of 05/01/13 (from the past 24 hour(s))  GLUCOSE, CAPILLARY     Status: Abnormal   Collection Time    05/07/13  4:02 PM      Result Value Range   Glucose-Capillary 146 (*) 70 - 99 mg/dL  HEPARIN LEVEL (UNFRACTIONATED)     Status: Abnormal   Collection Time    05/07/13  5:15 PM      Result Value Range   Heparin Unfractionated 0.71 (*) 0.30 - 0.70 IU/mL  GLUCOSE, CAPILLARY     Status: Abnormal   Collection Time    05/07/13  8:20 PM      Result Value Range   Glucose-Capillary 208 (*) 70 - 99 mg/dL  GLUCOSE, CAPILLARY     Status: Abnormal   Collection Time    05/08/13 12:05 AM      Result Value Range   Glucose-Capillary 145 (*) 70 - 99 mg/dL  HEPARIN LEVEL (UNFRACTIONATED)     Status: None   Collection Time    05/08/13  4:15 AM      Result Value Range   Heparin Unfractionated 0.64  0.30 - 0.70 IU/mL  BASIC METABOLIC PANEL     Status: Abnormal   Collection Time    05/08/13  4:15 AM      Result Value Range   Sodium 149 (*) 137 - 147 mEq/L   Potassium 3.7  3.7 - 5.3 mEq/L   Chloride 105  96 - 112 mEq/L   CO2 32  19 - 32 mEq/L   Glucose, Bld 117 (*) 70 - 99 mg/dL   BUN 46 (*) 6 - 23 mg/dL   Creatinine, Ser 0.73  0.50 - 1.10 mg/dL   Calcium 7.9 (*) 8.4 - 10.5 mg/dL   GFR calc non Af Amer 81 (*) >90 mL/min   GFR calc Af Amer >90  >90 mL/min  CBC WITH DIFFERENTIAL     Status: Abnormal   Collection Time    05/08/13  4:15 AM      Result Value  Range   WBC 17.3 (*) 4.0 - 10.5 K/uL   RBC 3.99  3.87 - 5.11 MIL/uL   Hemoglobin 11.3 (*) 12.0 - 15.0 g/dL   HCT 35.2 (*) 36.0 - 46.0 %   MCV 88.2  78.0 - 100.0 fL   MCH 28.3  26.0 - 34.0 pg   MCHC 32.1  30.0 - 36.0 g/dL   RDW 14.4  11.5 - 15.5 %   Platelets 308  150 - 400 K/uL   Neutrophils Relative % 73  43 - 77 %   Neutro Abs 12.6 (*) 1.7 - 7.7 K/uL   Lymphocytes Relative 19  12 - 46 %   Lymphs Abs 3.4  0.7 - 4.0 K/uL   Monocytes Relative 8  3 - 12 %   Monocytes Absolute 1.3 (*) 0.1 - 1.0 K/uL   Eosinophils Relative 0  0 - 5 %   Eosinophils Absolute 0.0  0.0 - 0.7 K/uL   Basophils Relative 0  0 - 1 %   Basophils Absolute 0.0  0.0 - 0.1 K/uL  GLUCOSE, CAPILLARY     Status: Abnormal   Collection Time    05/08/13  4:22 AM      Result Value Range   Glucose-Capillary 108 (*) 70 - 99 mg/dL   Comment 1 Documented in Chart     Comment 2 Notify RN    GLUCOSE, CAPILLARY     Status: None   Collection Time    05/08/13  7:59 AM      Result Value Range   Glucose-Capillary 98  70 - 99 mg/dL  GLUCOSE, CAPILLARY     Status: Abnormal   Collection Time    05/08/13 12:40 PM      Result Value Range   Glucose-Capillary 161 (*) 70 - 99 mg/dL    Intake/Output Summary (Last 24 hours) at 05/08/13 1459 Last data filed at 05/08/13 1300  Gross per 24 hour  Intake 1521.23 ml  Output   1910 ml  Net -388.77 ml     ASSESSMENT AND PLAN:  Respiratory Failure:  Per CCM.  Now extubated.    NSTEMI (non-ST elevated myocardial infarction):    Initial plan was for a noninvasive evaluation.  However, I would consider cath if she has significant recovery from her acute illnesses.     HTN:  BP is improved.  I increased the Bidil to tid. This could be further increased as needed to 2 tablets tid.    We will see again on Monday.  Call over the weekend with questions.   Minus Breeding 05/08/2013 2:59 PM

## 2013-05-08 NOTE — Evaluation (Signed)
Physical Therapy Evaluation Patient Details Name: Brenda Hogan MRN: [id number] DOB: 08/26/1934 Today's Date: 05/08/2013 Time: 1200-1230 PT Time Calculation (min): 30 min  PT Assessment / Plan / Recommendation History of Present Illness  79 yo with COPD, bed bound, found in respiratory distress, intubated by EMS. Troponin elevated upon arrival to ICU.Marland Kitchen   Influenza A positive  Clinical Impression  Pt admitted with respiratory distress. Pt currently with functional limitations due to the deficits listed below (see PT Problem List). Found to be Flu A positive.   Pt will benefit from skilled PT to increase their independence and safety with mobility to allow discharge to the venue listed below.       PT Assessment  Patient needs continued PT services    Follow Up Recommendations  SNF    Does the patient have the potential to tolerate intense rehabilitation      Barriers to Discharge        Equipment Recommendations  Other (comment) (TBA)    Recommendations for Other Services     Frequency Min 3X/week    Precautions / Restrictions Precautions Precautions: Fall Restrictions Weight Bearing Restrictions: No   Pertinent Vitals/Pain Vitals stable on 3-4 L O2      Mobility  Bed Mobility Bed Mobility: Rolling Right;Rolling Left;Supine to Sit Rolling Right: 3: Mod assist;With rail Rolling Left: 3: Mod assist;With rail Supine to Sit: 1: +2 Total assist;HOB elevated Supine to Sit: Patient Percentage: 30% Details for Bed Mobility Assistance: pt so fearful of falling that she was more resistant than helpful Transfers Transfers: Not assessed (pt too fearful) Details for Transfer Assistance: pt too fearful to attempt today Ambulation/Gait Ambulation/Gait Assistance: Not tested (comment) Stairs: No Wheelchair Mobility Wheelchair Mobility: No    Exercises     PT Diagnosis: Generalized weakness  PT Problem List: Decreased strength;Decreased activity tolerance;Decreased  balance;Decreased mobility;Decreased knowledge of use of DME;Decreased safety awareness;Cardiopulmonary status limiting activity PT Treatment Interventions: Functional mobility training;Therapeutic activities;Balance training;Patient/family education     PT Goals(Current goals can be found in the care plan section) Acute Rehab PT Goals Patient Stated Goal: pt did not state a goal PT Goal Formulation: With patient Time For Goal Achievement: 05/22/13 Potential to Achieve Goals: Good  Visit Information  Last PT Received On: 05/08/13 Assistance Needed: +2 History of Present Illness: 79 yo with COPD, bed bound, found in respiratory distress, intubated by EMS. Troponin elevated upon arrival to ICU.Marland Kitchen   Influenza A positive       Prior Functioning  Home Living Family/patient expects to be discharged to:: Skilled nursing facility Living Arrangements: Children Prior Function Level of Independence:  (Not sure of pt's PLOF) Communication Communication: No difficulties    Cognition  Cognition Arousal/Alertness: Awake/alert Behavior During Therapy: Anxious Overall Cognitive Status: Impaired/Different from baseline Area of Impairment: Following commands;Safety/judgement;Problem solving Following Commands: Follows one step commands with increased time Safety/Judgement: Decreased awareness of deficits;Decreased awareness of safety Problem Solving: Slow processing    Extremity/Trunk Assessment Upper Extremity Assessment Upper Extremity Assessment: Defer to OT evaluation;Generalized weakness Lower Extremity Assessment Lower Extremity Assessment: Generalized weakness;Difficult to assess due to impaired cognition   Balance Balance Balance Assessed: Yes Static Sitting Balance Static Sitting - Balance Support: Right upper extremity supported;Left upper extremity supported;Bilateral upper extremity supported;Feet supported Static Sitting - Level of Assistance: 3: Mod assist;Other (comment) (to  max depending on her level of fear.) Static Sitting - Comment/# of Minutes: 13 min at EOB working on sitting balance and sitting tolerance  End of  Session PT - End of Session Activity Tolerance: Other (comment) (limited by fear.) Patient left: in bed;with call bell/phone within reach Nurse Communication: Mobility status  GP     Markita Stcharles, [name] 05/08/2013, 12:59 PM 05/08/2013  Donnella Sham, PT [phone]  (pager)

## 2013-05-08 NOTE — Progress Notes (Signed)
Report received from [place], [place]. Patient transferred to [place]. Patient assessed. Patient is stable and resting comfortably. Elmarie Shiley R

## 2013-05-09 DIAGNOSIS — J441 Chronic obstructive pulmonary disease with (acute) exacerbation: Secondary | ICD-10-CM | POA: Diagnosis present

## 2013-05-09 LAB — BASIC METABOLIC PANEL
BUN: 35 mg/dL — ABNORMAL HIGH (ref 6–23)
CO2: 32 mEq/L (ref 19–32)
Calcium: 8 mg/dL — ABNORMAL LOW (ref 8.4–10.5)
Chloride: 101 mEq/L (ref 96–112)
Creatinine, Ser: 0.68 mg/dL (ref 0.50–1.10)
GFR calc Af Amer: >90 mL/min (ref >90)
GFR, EST NON AFRICAN AMERICAN: 83 mL/min — AB (ref >90)
Glucose, Bld: 87 mg/dL (ref 70–99)
POTASSIUM: 3.5 meq/L — AB (ref 3.7–5.3)
SODIUM: 145 meq/L (ref 137–147)

## 2013-05-09 LAB — GLUCOSE, CAPILLARY
GLUCOSE-CAPILLARY: 214 mg/dL — AB (ref 70–99)
Glucose-Capillary: 84 mg/dL (ref 70–99)
Glucose-Capillary: 126 mg/dL — ABNORMAL HIGH (ref 70–99)
Glucose-Capillary: 140 mg/dL — ABNORMAL HIGH (ref 70–99)

## 2013-05-09 LAB — HEPARIN LEVEL (UNFRACTIONATED): Heparin Unfractionated: 0.53 IU/mL (ref 0.30–0.70)

## 2013-05-09 MED ORDER — POTASSIUM CHLORIDE CRYS ER 20 MEQ PO TBCR
40.0000 meq | EXTENDED_RELEASE_TABLET | Freq: Once | ORAL | Status: AC
  Administered 2013-05-09: 40 meq via ORAL
  Filled 2013-05-09: qty 2

## 2013-05-09 MED ORDER — PNEUMOCOCCAL VAC POLYVALENT 25 MCG/0.5ML IJ INJ
0.5000 mL | INJECTION | INTRAMUSCULAR | Status: DC
  Filled 2013-05-09: qty 0.5

## 2013-05-09 NOTE — Progress Notes (Signed)
[facility] PROGRESS NOTE  Ed Rayson MWN:[id number] DOB: [birth year] DOA: 05/01/2013 PCP: Ellamae Sia, MD  Assessment/Plan: #1 acute respiratory failure/severe acute COPD exacerbation/influenza A Status post extubation 05/07/2013. Secondary to severe acute exacerbation of COPD, influenza A, likely flash pulmonary edema. Clinical improvement. Continue steroid taper, nebulizer treatments, oxygen. Patient is status post Tamiflu and a course of antibiotics. Follow.  #2 NSTEMI Patient without any current chest pain. Cardiology following that considering the cardiac catheterization. Continue medical treatment with aspirin, Lipitor, Lasix, bidil, Lopressor. Patient on a heparin drip. Per cardiology.  #3 CHF exacerbation Cardiac enzymes were elevated. 2-D echo with EF of 40-45%. Left ventricle with mild global hypokinesis with mild to moderate apical hypokinesis. Currently on IV Lasix. Continue Lopressor, BiDil, aspirin, Lipitor. Cardiology following.  #4 acute COPD exacerbation Continue oxygen, nebs, steroid taper. Patient is status post antibiotic course. Follow.  #5 influenza A. Status post five-day course of Tamiflu.  #6 hypertension Stable. Continue Lasix, BiDil, Lopressor.  #7 hyperglycemia Check a hemoglobin A1c. Continue Lantus. Sliding scale insulin.  #8 acute encephalopathy Resolved. Ativan as needed for anxiety.  #9 prophylaxis Heparin for DVT prophylaxis.  Code Status: DO NOT RESUSCITATE Family Communication: Updated patient no family at bedside. Disposition Plan: Skilled nursing facility when medically stable   Consultants:  Critical care medicine  Cardiology  Procedures: OETT 12/26 >>> 12/28  OGT 12/26 >>> 12/28  Foley 12/26 >>>  OETT 12/29 >>12/31 (one way 12/26 Head CT >>> neg  12/28 echo> LVEF 40-45%, LVH (mild), mild MR, IVC dilated      Antibiotics: Ceftriaxone 12/26 x1  Vancomycin 12/26 >>> 12/28  Levaquin 12/27 >>>12/30   Tamiflu 12/26 >>> 12/31  CULTURES:  12/26 MRSA >>> neg  12/26 Blood >>>NGTD  12/26 Urine >>>neg  12/26 Trach aspirate >>>neg  12/26 Multoplex Virus PCR >>> influenza A H3    HPI/Subjective: Patient states she's feeling somewhat better. No complaints. Patient complaining of fatigue.  Objective: Filed Vitals:   05/09/13 1438  BP: 137/62  Pulse: 74  Temp: 98.1 F (36.7 C)  Resp: 18    Intake/Output Summary (Last 24 hours) at 05/09/13 1700 Last data filed at 05/09/13 1300  Gross per 24 hour  Intake    960 ml  Output   1701 ml  Net   -741 ml   Filed Weights   05/07/13 0500 05/08/13 0229 05/09/13 0521  Weight: 86 kg (189 lb 9.5 oz) 85.8 kg (189 lb 2.5 oz) 87.726 kg (193 lb 6.4 oz)    Exam:   General:  NAD  Cardiovascular: Regular rate rhythm no murmurs rubs or gallops  Respiratory: Some coarse breath sounds. No wheezing.  Abdomen: Soft, nontender, nondistended, positive bowel sounds.  Musculoskeletal: No clubbing cyanosis or edema  Data Reviewed: Basic Metabolic Panel:  Recent Labs Lab 05/05/13 0500 05/06/13 0443 05/07/13 0445 05/08/13 0415 05/09/13 0440  NA 142 141 149* 149* 145  K 3.9 4.0 3.4* 3.7 3.5*  CL 98 98 106 105 101  CO2 30 28 32 32 32  GLUCOSE 142* 242* 212* 117* 87  BUN 33* 49* 57* 46* 35*  CREATININE 1.00 1.14* 0.90 0.73 0.68  CALCIUM 8.0* 7.7* 7.7* 7.9* 8.0*   Liver Function Tests: No results found for this basename: AST, ALT, ALKPHOS, BILITOT, PROT, ALBUMIN,  in the last 168 hours No results found for this basename: LIPASE, AMYLASE,  in the last 168 hours No results found for this basename: AMMONIA,  in the last 168 hours CBC:  Recent Labs Lab 05/04/13 0335 05/05/13 0500 05/06/13 0443 05/07/13 0445 05/08/13 0415  WBC 21.1* 18.4* 16.5* 16.1* 17.3*  NEUTROABS  --  15.9* 14.5* 12.1* 12.6*  HGB 11.5* 12.5 12.1 10.9* 11.3*  HCT 35.4* 38.4 38.5 34.1* 35.2*  MCV 87.6 88.1 89.1 89.5 88.2  PLT 329 334 323 293 308   Cardiac  Enzymes:  Recent Labs Lab 05/02/13 2126  TROPONINI 2.15*   BNP (last 3 results)  Recent Labs  05/01/13 2158  PROBNP 3477.0*   CBG:  Recent Labs Lab 05/08/13 1640 05/08/13 2048 05/09/13 0633 05/09/13 1135 05/09/13 1625  GLUCAP 129* 125* 84 126* 140*    Recent Results (from the past 240 hour(s))  CULTURE, BLOOD (ROUTINE X 2)     Status: None   Collection Time    05/01/13  4:40 PM      Result Value Range Status   Specimen Description BLOOD ARM RIGHT   Final   Special Requests BOTTLES DRAWN AEROBIC ONLY 10CC   Final   Culture  Setup Time     Final   Value: 05/01/2013 20:43     Performed at [facility]   Culture     Final   Value: NO GROWTH 5 DAYS     Performed at [facility]   Report Status 05/07/2013 FINAL   Final  CULTURE, BLOOD (ROUTINE X 2)     Status: None   Collection Time    05/01/13  4:45 PM      Result Value Range Status   Specimen Description BLOOD HAND RIGHT   Final   Special Requests BOTTLES DRAWN AEROBIC AND ANAEROBIC 10CC   Final   Culture  Setup Time     Final   Value: 05/01/2013 20:44     Performed at [facility]   Culture     Final   Value: NO GROWTH 5 DAYS     Performed at [facility]   Report Status 05/07/2013 FINAL   Final  URINE CULTURE     Status: None   Collection Time    05/01/13  4:47 PM      Result Value Range Status   Specimen Description URINE, CATHETERIZED   Final   Special Requests NONE   Final   Culture  Setup Time     Final   Value: 05/02/2013 00:24     Performed at [place]     Final   Value: NO GROWTH     Performed at [facility]   Culture     Final   Value: NO GROWTH     Performed at [facility]   Report Status 05/03/2013 FINAL   Final  CULTURE, RESPIRATORY (NON-EXPECTORATED)     Status: None   Collection Time    05/01/13  6:35 PM      Result Value Range Status   Specimen Description TRACHEAL ASPIRATE   Final   Special Requests NONE    Final   Gram Stain     Final   Value: FEW WBC PRESENT, PREDOMINANTLY PMN     RARE SQUAMOUS EPITHELIAL CELLS PRESENT     NO ORGANISMS SEEN     Performed at [facility]   Culture     Final   Value: Non-Pathogenic Oropharyngeal-type Flora Isolated.     Performed at [facility]   Report Status 05/04/2013 FINAL   Final  RESPIRATORY VIRUS PANEL  Status: Abnormal   Collection Time    05/01/13  9:28 PM      Result Value Range Status   Source - RVPAN NASAL SWAB   Corrected   Comment: CORRECTED ON 12/28 AT 2319: PREVIOUSLY REPORTED AS NASAL SWAB   Respiratory Syncytial Virus A NOT DETECTED   Final   Respiratory Syncytial Virus B NOT DETECTED   Final   Influenza A DETECTED (*)  Final   Influenza B NOT DETECTED   Final   Parainfluenza 1 NOT DETECTED   Final   Parainfluenza 2 NOT DETECTED   Final   Parainfluenza 3 NOT DETECTED   Final   Metapneumovirus NOT DETECTED   Final   Rhinovirus NOT DETECTED   Final   Adenovirus NOT DETECTED   Final   Influenza A H1 NOT DETECTED   Final   Influenza A H3 DETECTED (*)  Final   Comment: (NOTE)           Normal Reference Range for each Analyte: NOT DETECTED     Testing performed using the Luminex xTAG Respiratory Viral Panel test     kit.     This test was developed and its performance characteristics determined     by [facility]. It has not been cleared or approved by the [place]     Food and Drug Administration. This test is used for clinical purposes.     It should not be regarded as investigational or for research. This     laboratory is certified under the [place] (CLIA) as qualified to perform high complexity     clinical laboratory testing.     Performed at [facility] PCR SCREENING     Status: None   Collection Time    05/01/13  9:28 PM      Result Value Range Status   MRSA by PCR NEGATIVE  NEGATIVE Final   Comment:            The GeneXpert MRSA Assay  (FDA     approved for NASAL specimens     only), is one component of a     comprehensive MRSA colonization     surveillance program. It is not     intended to diagnose MRSA     infection nor to guide or     monitor treatment for     MRSA infections.  URINE CULTURE     Status: None   Collection Time    05/01/13  9:39 PM      Result Value Range Status   Specimen Description URINE, CATHETERIZED   Final   Special Requests NONE   Final   Culture  Setup Time     Final   Value: 05/02/2013 14:18     Performed at [place]     Final   Value: NO GROWTH     Performed at [facility]   Culture     Final   Value: NO GROWTH     Performed at [facility]   Report Status 05/03/2013 FINAL   Final  CULTURE, BLOOD (ROUTINE X 2)     Status: None   Collection Time    05/01/13  9:50 PM      Result Value Range Status   Specimen Description BLOOD RIGHT HAND   Final   Special Requests BOTTLES DRAWN AEROBIC ONLY [place]   Final  Culture  Setup Time     Final   Value: 05/02/2013 12:50     Performed at [facility]   Culture     Final   Value: NO GROWTH 5 DAYS     Performed at [facility]   Report Status 05/08/2013 FINAL   Final  CULTURE, BLOOD (ROUTINE X 2)     Status: None   Collection Time    05/01/13  9:58 PM      Result Value Range Status   Specimen Description BLOOD RIGHT HAND   Final   Special Requests BOTTLES DRAWN AEROBIC ONLY 1CC   Final   Culture  Setup Time     Final   Value: 05/02/2013 12:50     Performed at [facility]   Culture     Final   Value: NO GROWTH 5 DAYS     Performed at [facility]   Report Status 05/08/2013 FINAL   Final     Studies: No results found.  Scheduled Meds: . aspirin  81 mg Oral Daily  . atorvastatin  40 mg Oral q1800  . furosemide  40 mg Intravenous Daily  . insulin aspart  0-5 Units Subcutaneous QHS  . insulin aspart  0-9 Units Subcutaneous TID WC  . insulin glargine  30  Units Subcutaneous Daily  . ipratropium-albuterol  3 mL Nebulization TID  . isosorbide-hydrALAZINE  1 tablet Oral TID  . metoprolol tartrate  25 mg Oral BID  . [START ON 05/12/2013] pneumococcal 23 valent vaccine  0.5 mL Intramuscular Tomorrow-1000  . predniSONE  40 mg Oral Q breakfast   Continuous Infusions: . sodium chloride 50 mL/hr at 05/09/13 1522  . heparin 1,050 Units/hr (05/08/13 1904)    Principal Problem:   Acute respiratory failure Active Problems:   Sepsis   Influenza A   NSTEMI (non-ST elevated myocardial infarction)   COPD with exacerbation    Time spent: 100 minutes    THOMPSON,DANIEL M.D. [facility] Pager [phone]. If 7PM-7AM, please contact night-coverage at [email or web address], password [facility] 05/09/2013, 5:00 PM  LOS: 8 days

## 2013-05-09 NOTE — Progress Notes (Signed)
ANTICOAGULATION CONSULT NOTE - Follow Up Consult  Pharmacy Consult for Heparin Indication: NSTEMI  No Known Allergies  Patient Measurements: Height: 5\' 2"  (157.5 cm) Weight: 193 lb 6.4 oz (87.726 kg) IBW/kg (Calculated) : 50.1 Heparin Dosing Weight: 70kg  Vital Signs: Temp: 98 F (36.7 C) (01/03 0521) Temp src: Oral (01/03 0521) BP: 152/69 mmHg (01/03 0521) Pulse Rate: 79 (01/03 0521)  Labs:  Recent Labs  05/07/13 0445 05/07/13 1715 05/08/13 0415 05/09/13 0440  HGB 10.9*  --  11.3*  --   HCT 34.1*  --  35.2*  --   PLT 293  --  308  --   HEPARINUNFRC 0.26* 0.71* 0.64 0.53  CREATININE 0.90  --  0.73 0.68    Estimated Creatinine Clearance: 61.5 ml/min (by C-G formula based on Cr of 0.68).  Medications:  Heparin 1050 units/hr  Assessment: 29 yof continues on IV heparin for NSTEMI. Heparin level is at goal at 0.53. CBC is stable. No bleeding noted.  Noninvasive approach planned at this time. Cardiology to reconsider cath on Monday.  Goal of Therapy:  Heparin level 0.3-0.7 units/ml Monitor platelets by anticoagulation protocol: Yes   Plan:  1. Continue heparin gtt at 1050 units/hr 2. F/u AM heparin level and CBC 3. F/u cath plans  Erin Hearing PharmD., BCPS Clinical Pharmacist Pager [phone] 05/09/2013 11:16 AM

## 2013-05-10 DIAGNOSIS — E119 Type 2 diabetes mellitus without complications: Secondary | ICD-10-CM | POA: Insufficient documentation

## 2013-05-10 LAB — BASIC METABOLIC PANEL
BUN: 26 mg/dL — ABNORMAL HIGH (ref 6–23)
CHLORIDE: 99 meq/L (ref 96–112)
CO2: 30 meq/L (ref 19–32)
Calcium: 8.1 mg/dL — ABNORMAL LOW (ref 8.4–10.5)
Creatinine, Ser: 0.63 mg/dL (ref 0.50–1.10)
GFR calc Af Amer: >90 mL/min (ref >90)
GFR calc non Af Amer: 85 mL/min — ABNORMAL LOW (ref >90)
Glucose, Bld: 137 mg/dL — ABNORMAL HIGH (ref 70–99)
Potassium: 3.2 mEq/L — ABNORMAL LOW (ref 3.7–5.3)
SODIUM: 141 meq/L (ref 137–147)

## 2013-05-10 LAB — GLUCOSE, CAPILLARY
GLUCOSE-CAPILLARY: 144 mg/dL — AB (ref 70–99)
GLUCOSE-CAPILLARY: 223 mg/dL — AB (ref 70–99)
GLUCOSE-CAPILLARY: 246 mg/dL — AB (ref 70–99)
Glucose-Capillary: 114 mg/dL — ABNORMAL HIGH (ref 70–99)

## 2013-05-10 LAB — CBC
HEMATOCRIT: 32.1 % — AB (ref 36.0–46.0)
Hemoglobin: 10.7 g/dL — ABNORMAL LOW (ref 12.0–15.0)
MCH: 28.9 pg (ref 26.0–34.0)
MCHC: 33.3 g/dL (ref 30.0–36.0)
MCV: 86.8 fL (ref 78.0–100.0)
Platelets: 296 10*3/uL (ref 150–400)
RBC: 3.7 MIL/uL — AB (ref 3.87–5.11)
RDW: 13.8 % (ref 11.5–15.5)
WBC: 15.2 10*3/uL — AB (ref 4.0–10.5)

## 2013-05-10 LAB — HEMOGLOBIN A1C
Hgb A1c MFr Bld: 11.1 % — ABNORMAL HIGH (ref <5.7)
Mean Plasma Glucose: 272 mg/dL — ABNORMAL HIGH (ref <117)

## 2013-05-10 LAB — HEPARIN LEVEL (UNFRACTIONATED): Heparin Unfractionated: 0.48 IU/mL (ref 0.30–0.70)

## 2013-05-10 MED ORDER — POTASSIUM CHLORIDE CRYS ER 20 MEQ PO TBCR
40.0000 meq | EXTENDED_RELEASE_TABLET | ORAL | Status: AC
  Administered 2013-05-10: 40 meq via ORAL
  Filled 2013-05-10: qty 2

## 2013-05-10 MED ORDER — IPRATROPIUM-ALBUTEROL 0.5-2.5 (3) MG/3ML IN SOLN
3.0000 mL | Freq: Four times a day (QID) | RESPIRATORY_TRACT | Status: DC
  Administered 2013-05-10 – 2013-05-11 (×5): 3 mL via RESPIRATORY_TRACT
  Filled 2013-05-10 (×5): qty 3

## 2013-05-10 NOTE — Progress Notes (Signed)
[facility] PROGRESS NOTE  Brenda Hogan DJS:[id number] DOB: Nov 02, 1934 DOA: 05/01/2013 PCP: Ellamae Sia, MD  Assessment/Plan: #1 acute respiratory failure/severe acute COPD exacerbation/influenza A Status post extubation 05/07/2013. Secondary to severe acute exacerbation of COPD, influenza A, likely flash pulmonary edema. Clinical improvement. Continue steroid taper, nebulizer treatments, oxygen, lasix. Patient is status post Tamiflu and a course of antibiotics. Follow.  #2 NSTEMI Patient without any current chest pain. Cardiology following that considering the cardiac catheterization. Continue medical treatment with aspirin, Lipitor, Lasix, bidil, Lopressor. Patient on a heparin drip. Per cardiology.  #3 CHF exacerbation Cardiac enzymes were elevated. 2-D echo with EF of 40-45%. Left ventricle with mild global hypokinesis with mild to moderate apical hypokinesis. Currently on IV Lasix. Continue Lopressor, BiDil, aspirin, Lipitor. Cardiology following.  #4 acute COPD exacerbation Continue oxygen, nebs, steroid taper. Patient is status post antibiotic course. Follow.  #5 influenza A. Status post five-day course of Tamiflu.  #6 hypertension/Diabetes Stable. Continue Lasix, BiDil, Lopressor.  #7 hyperglycemia Hemoglobin A1c = 11.1. CBG 114 -214.  Continue Lantus. Sliding scale insulin.  #8 acute encephalopathy Resolved. Ativan as needed for anxiety.  #9 Hypokalemia Replete  #9 prophylaxis Heparin for DVT prophylaxis.  Code Status: DO NOT RESUSCITATE Family Communication: Updated patient no family at bedside. Disposition Plan: Skilled nursing facility when medically stable   Consultants:  Critical care medicine  Cardiology  Procedures: OETT 12/26 >>> 12/28  OGT 12/26 >>> 12/28  Foley 12/26 >>>  OETT 12/29 >>12/31 (one way 12/26 Head CT >>> neg  12/28 echo> LVEF 40-45%, LVH (mild), mild MR, IVC dilated      Antibiotics: Ceftriaxone 12/26 x1   Vancomycin 12/26 >>> 12/28  Levaquin 12/27 >>>12/30  Tamiflu 12/26 >>> 12/31  CULTURES:  12/26 MRSA >>> neg  12/26 Blood >>>NGTD  12/26 Urine >>>neg  12/26 Trach aspirate >>>neg  12/26 Multoplex Virus PCR >>> influenza A H3    HPI/Subjective: Patient states she's feeling somewhat better. No complaints. Patient complaining of fatigue. Per nursing patient with 2 loose stools today.  Objective: Filed Vitals:   05/10/13 1504  BP: 146/62  Pulse: 77  Temp: 98 F (36.7 C)  Resp: 18    Intake/Output Summary (Last 24 hours) at 05/10/13 1658 Last data filed at 05/10/13 1500  Gross per 24 hour  Intake    580 ml  Output   1100 ml  Net   -520 ml   Filed Weights   05/08/13 0229 05/09/13 0521 05/10/13 0356  Weight: 85.8 kg (189 lb 2.5 oz) 87.726 kg (193 lb 6.4 oz) 89.631 kg (197 lb 9.6 oz)    Exam:   General:  NAD  Cardiovascular: Regular rate rhythm no murmurs rubs or gallops  Respiratory: Some coarse breath sounds. No wheezing.  Abdomen: Soft, nontender, nondistended, positive bowel sounds.  Musculoskeletal: No clubbing cyanosis or edema  Data Reviewed: Basic Metabolic Panel:  Recent Labs Lab 05/06/13 0443 05/07/13 0445 05/08/13 0415 05/09/13 0440 05/10/13 0430  NA 141 149* 149* 145 141  K 4.0 3.4* 3.7 3.5* 3.2*  CL 98 106 105 101 99  CO2 28 32 32 32 30  GLUCOSE 242* 212* 117* 87 137*  BUN 49* 57* 46* 35* 26*  CREATININE 1.14* 0.90 0.73 0.68 0.63  CALCIUM 7.7* 7.7* 7.9* 8.0* 8.1*   Liver Function Tests: No results found for this basename: AST, ALT, ALKPHOS, BILITOT, PROT, ALBUMIN,  in the last 168 hours No results found for this basename: LIPASE, AMYLASE,  in the last  168 hours No results found for this basename: AMMONIA,  in the last 168 hours CBC:  Recent Labs Lab 05/05/13 0500 05/06/13 0443 05/07/13 0445 05/08/13 0415 05/10/13 0430  WBC 18.4* 16.5* 16.1* 17.3* 15.2*  NEUTROABS 15.9* 14.5* 12.1* 12.6*  --   HGB 12.5 12.1 10.9* 11.3* 10.7*   HCT 38.4 38.5 34.1* 35.2* 32.1*  MCV 88.1 89.1 89.5 88.2 86.8  PLT 334 323 293 308 296   Cardiac Enzymes: No results found for this basename: CKTOTAL, CKMB, CKMBINDEX, TROPONINI,  in the last 168 hours BNP (last 3 results)  Recent Labs  05/01/13 2158  PROBNP 3477.0*   CBG:  Recent Labs Lab 05/09/13 1135 05/09/13 1625 05/09/13 2042 05/10/13 0607 05/10/13 1127  GLUCAP 126* 140* 214* 114* 144*    Recent Results (from the past 240 hour(s))  CULTURE, BLOOD (ROUTINE X 2)     Status: None   Collection Time    05/01/13  4:40 PM      Result Value Range Status   Specimen Description BLOOD ARM RIGHT   Final   Special Requests BOTTLES DRAWN AEROBIC ONLY 10CC   Final   Culture  Setup Time     Final   Value: 05/01/2013 20:43     Performed at [facility]   Culture     Final   Value: NO GROWTH 5 DAYS     Performed at [facility]   Report Status 05/07/2013 FINAL   Final  CULTURE, BLOOD (ROUTINE X 2)     Status: None   Collection Time    05/01/13  4:45 PM      Result Value Range Status   Specimen Description BLOOD HAND RIGHT   Final   Special Requests BOTTLES DRAWN AEROBIC AND ANAEROBIC 10CC   Final   Culture  Setup Time     Final   Value: 05/01/2013 20:44     Performed at [place]     Final   Value: NO GROWTH 5 DAYS     Performed at [facility]   Report Status 05/07/2013 FINAL   Final  URINE CULTURE     Status: None   Collection Time    05/01/13  4:47 PM      Result Value Range Status   Specimen Description URINE, CATHETERIZED   Final   Special Requests NONE   Final   Culture  Setup Time     Final   Value: 05/02/2013 00:24     Performed at [place]     Final   Value: NO GROWTH     Performed at [facility]   Culture     Final   Value: NO GROWTH     Performed at [facility]   Report Status 05/03/2013 FINAL   Final  CULTURE, RESPIRATORY (NON-EXPECTORATED)     Status: None    Collection Time    05/01/13  6:35 PM      Result Value Range Status   Specimen Description TRACHEAL ASPIRATE   Final   Special Requests NONE   Final   Gram Stain     Final   Value: FEW WBC PRESENT, PREDOMINANTLY PMN     RARE SQUAMOUS EPITHELIAL CELLS PRESENT     NO ORGANISMS SEEN     Performed at [facility]   Culture     Final   Value: Non-Pathogenic Oropharyngeal-type Flora Isolated.  Performed at [facility]   Report Status 05/04/2013 FINAL   Final  RESPIRATORY VIRUS PANEL     Status: Abnormal   Collection Time    05/01/13  9:28 PM      Result Value Range Status   Source - RVPAN NASAL SWAB   Corrected   Comment: CORRECTED ON 12/28 AT 2319: PREVIOUSLY REPORTED AS NASAL SWAB   Respiratory Syncytial Virus A NOT DETECTED   Final   Respiratory Syncytial Virus B NOT DETECTED   Final   Influenza A DETECTED (*)  Final   Influenza B NOT DETECTED   Final   Parainfluenza 1 NOT DETECTED   Final   Parainfluenza 2 NOT DETECTED   Final   Parainfluenza 3 NOT DETECTED   Final   Metapneumovirus NOT DETECTED   Final   Rhinovirus NOT DETECTED   Final   Adenovirus NOT DETECTED   Final   Influenza A H1 NOT DETECTED   Final   Influenza A H3 DETECTED (*)  Final   Comment: (NOTE)           Normal Reference Range for each Analyte: NOT DETECTED     Testing performed using the Luminex xTAG Respiratory Viral Panel test     kit.     This test was developed and its performance characteristics determined     by [facility]. It has not been cleared or approved by the [place]     Food and Drug Administration. This test is used for clinical purposes.     It should not be regarded as investigational or for research. This     laboratory is certified under the [place] (CLIA) as qualified to perform high complexity     clinical laboratory testing.     Performed at [facility] PCR SCREENING     Status: None   Collection  Time    05/01/13  9:28 PM      Result Value Range Status   MRSA by PCR NEGATIVE  NEGATIVE Final   Comment:            The GeneXpert MRSA Assay (FDA     approved for NASAL specimens     only), is one component of a     comprehensive MRSA colonization     surveillance program. It is not     intended to diagnose MRSA     infection nor to guide or     monitor treatment for     MRSA infections.  URINE CULTURE     Status: None   Collection Time    05/01/13  9:39 PM      Result Value Range Status   Specimen Description URINE, CATHETERIZED   Final   Special Requests NONE   Final   Culture  Setup Time     Final   Value: 05/02/2013 14:18     Performed at [place]     Final   Value: NO GROWTH     Performed at [facility]   Culture     Final   Value: NO GROWTH     Performed at [facility]   Report Status 05/03/2013 FINAL   Final  CULTURE, BLOOD (ROUTINE X 2)     Status: None   Collection Time    05/01/13  9:50 PM      Result Value Range Status  Specimen Description BLOOD RIGHT HAND   Final   Special Requests BOTTLES DRAWN AEROBIC ONLY 2CC   Final   Culture  Setup Time     Final   Value: 05/02/2013 12:50     Performed at [facility]   Culture     Final   Value: NO GROWTH 5 DAYS     Performed at [facility]   Report Status 05/08/2013 FINAL   Final  CULTURE, BLOOD (ROUTINE X 2)     Status: None   Collection Time    05/01/13  9:58 PM      Result Value Range Status   Specimen Description BLOOD RIGHT HAND   Final   Special Requests BOTTLES DRAWN AEROBIC ONLY 1CC   Final   Culture  Setup Time     Final   Value: 05/02/2013 12:50     Performed at [facility]   Culture     Final   Value: NO GROWTH 5 DAYS     Performed at [facility]   Report Status 05/08/2013 FINAL   Final     Studies: No results found.  Scheduled Meds: . aspirin  81 mg Oral Daily  . atorvastatin  40 mg Oral q1800  . furosemide   40 mg Intravenous Daily  . insulin aspart  0-5 Units Subcutaneous QHS  . insulin aspart  0-9 Units Subcutaneous TID WC  . insulin glargine  30 Units Subcutaneous Daily  . ipratropium-albuterol  3 mL Nebulization Q6H  . isosorbide-hydrALAZINE  1 tablet Oral TID  . metoprolol tartrate  25 mg Oral BID  . [START ON 05/12/2013] pneumococcal 23 valent vaccine  0.5 mL Intramuscular Tomorrow-1000  . predniSONE  40 mg Oral Q breakfast   Continuous Infusions: . heparin 1,050 Units/hr (05/09/13 2048)    Principal Problem:   Acute respiratory failure Active Problems:   Sepsis   Influenza A   NSTEMI (non-ST elevated myocardial infarction)   COPD with exacerbation   Diabetes    Time spent: 27 minutes    Renell Coaxum M.D. [facility] Pager [phone]. If 7PM-7AM, please contact night-coverage at [email or web address], password [facility] 05/10/2013, 4:58 PM  LOS: 9 days

## 2013-05-10 NOTE — Progress Notes (Signed)
ANTICOAGULATION CONSULT NOTE - Follow Up Consult  Pharmacy Consult for Heparin Indication: NSTEMI  No Known Allergies  Patient Measurements: Height: 5\' 2"  (157.5 cm) Weight: 197 lb 9.6 oz (89.631 kg) IBW/kg (Calculated) : 50.1 Heparin Dosing Weight: 70kg  Vital Signs: Temp: 97.3 F (36.3 C) (01/04 0356) Temp src: Oral (01/04 0356) BP: 154/77 mmHg (01/04 0356) Pulse Rate: 78 (01/04 0356)  Labs:  Recent Labs  05/08/13 0415 05/09/13 0440 05/10/13 0430  HGB 11.3*  --  10.7*  HCT 35.2*  --  32.1*  PLT 308  --  296  HEPARINUNFRC 0.64 0.53 0.48  CREATININE 0.73 0.68 0.63    Estimated Creatinine Clearance: 62.2 ml/min (by C-G formula based on Cr of 0.63).  Medications:  Heparin 1050 units/hr  Assessment: 33 yof continues on IV heparin for NSTEMI. Heparin level is at goal at 0.4. CBC is stable. No bleeding noted.  Noninvasive approach planned at this time. Cardiology to reconsider cath on Monday.  Goal of Therapy:  Heparin level 0.3-0.7 units/ml Monitor platelets by anticoagulation protocol: Yes   Plan:  1. Continue heparin gtt at 1050 units/hr 2. F/u AM heparin level and CBC 3. F/u cath plans  Erin Hearing PharmD., BCPS Clinical Pharmacist Pager [phone] 05/10/2013 11:29 AM

## 2013-05-11 DIAGNOSIS — I10 Essential (primary) hypertension: Secondary | ICD-10-CM

## 2013-05-11 DIAGNOSIS — E119 Type 2 diabetes mellitus without complications: Secondary | ICD-10-CM

## 2013-05-11 DIAGNOSIS — I5041 Acute combined systolic (congestive) and diastolic (congestive) heart failure: Secondary | ICD-10-CM

## 2013-05-11 DIAGNOSIS — G9340 Encephalopathy, unspecified: Secondary | ICD-10-CM

## 2013-05-11 DIAGNOSIS — E876 Hypokalemia: Secondary | ICD-10-CM

## 2013-05-11 LAB — HEPARIN LEVEL (UNFRACTIONATED): HEPARIN UNFRACTIONATED: 0.46 [IU]/mL (ref 0.30–0.70)

## 2013-05-11 LAB — MAGNESIUM: Magnesium: 1.7 mg/dL (ref 1.5–2.5)

## 2013-05-11 LAB — BASIC METABOLIC PANEL
BUN: 18 mg/dL (ref 6–23)
BUN: 15 mg/dL (ref 6–23)
BUN: 16 mg/dL (ref 6–23)
CALCIUM: 8 mg/dL — AB (ref 8.4–10.5)
CHLORIDE: 97 meq/L (ref 96–112)
CO2: 31 mEq/L (ref 19–32)
CO2: 30 mEq/L (ref 19–32)
CO2: 29 meq/L (ref 19–32)
CREATININE: 0.65 mg/dL (ref 0.50–1.10)
CREATININE: 0.65 mg/dL (ref 0.50–1.10)
CREATININE: 0.65 mg/dL (ref 0.50–1.10)
Calcium: 7.7 mg/dL — ABNORMAL LOW (ref 8.4–10.5)
Calcium: 8.1 mg/dL — ABNORMAL LOW (ref 8.4–10.5)
Chloride: 101 mEq/L (ref 96–112)
Chloride: 97 mEq/L (ref 96–112)
GFR calc Af Amer: >90 mL/min (ref >90)
GFR calc Af Amer: >90 mL/min (ref >90)
GFR calc non Af Amer: 84 mL/min — ABNORMAL LOW (ref >90)
GFR, EST NON AFRICAN AMERICAN: 84 mL/min — AB (ref >90)
GFR, EST NON AFRICAN AMERICAN: 84 mL/min — AB (ref >90)
GLUCOSE: 203 mg/dL — AB (ref 70–99)
GLUCOSE: 278 mg/dL — AB (ref 70–99)
Glucose, Bld: 147 mg/dL — ABNORMAL HIGH (ref 70–99)
POTASSIUM: 3.1 meq/L — AB (ref 3.7–5.3)
Potassium: 2.5 mEq/L — CL (ref 3.7–5.3)
Potassium: 3.9 mEq/L (ref 3.7–5.3)
Sodium: 143 mEq/L (ref 137–147)
Sodium: 139 mEq/L (ref 137–147)
Sodium: 139 mEq/L (ref 137–147)

## 2013-05-11 LAB — GLUCOSE, CAPILLARY
GLUCOSE-CAPILLARY: 225 mg/dL — AB (ref 70–99)
Glucose-Capillary: 120 mg/dL — ABNORMAL HIGH (ref 70–99)
Glucose-Capillary: 148 mg/dL — ABNORMAL HIGH (ref 70–99)
Glucose-Capillary: 240 mg/dL — ABNORMAL HIGH (ref 70–99)

## 2013-05-11 LAB — CLOSTRIDIUM DIFFICILE BY PCR: CDIFFPCR: NEGATIVE

## 2013-05-11 LAB — CBC
HCT: 32.4 % — ABNORMAL LOW (ref 36.0–46.0)
Hemoglobin: 10.3 g/dL — ABNORMAL LOW (ref 12.0–15.0)
MCH: 27.8 pg (ref 26.0–34.0)
MCHC: 31.8 g/dL (ref 30.0–36.0)
MCV: 87.6 fL (ref 78.0–100.0)
PLATELETS: 278 10*3/uL (ref 150–400)
RBC: 3.7 MIL/uL — ABNORMAL LOW (ref 3.87–5.11)
RDW: 13.7 % (ref 11.5–15.5)
WBC: 13.8 10*3/uL — AB (ref 4.0–10.5)

## 2013-05-11 MED ORDER — POTASSIUM CHLORIDE CRYS ER 20 MEQ PO TBCR
40.0000 meq | EXTENDED_RELEASE_TABLET | Freq: Four times a day (QID) | ORAL | Status: DC
  Administered 2013-05-11 (×3): 40 meq via ORAL
  Filled 2013-05-11 (×3): qty 2

## 2013-05-11 MED ORDER — ALBUTEROL SULFATE (2.5 MG/3ML) 0.083% IN NEBU: 2.5000 mg | INHALATION_SOLUTION | RESPIRATORY_TRACT | Status: DC | PRN

## 2013-05-11 MED ORDER — POTASSIUM CHLORIDE CRYS ER 20 MEQ PO TBCR: 40.0000 meq | EXTENDED_RELEASE_TABLET | Freq: Three times a day (TID) | ORAL | Status: DC

## 2013-05-11 MED ORDER — HEPARIN SODIUM (PORCINE) 5000 UNIT/ML IJ SOLN
5000.0000 [IU] | Freq: Three times a day (TID) | INTRAMUSCULAR | Status: DC
  Administered 2013-05-11 – 2013-05-13 (×6): 5000 [IU] via SUBCUTANEOUS
  Filled 2013-05-11 (×10): qty 1

## 2013-05-11 MED ORDER — DEXTROSE 5 % IV SOLN
3.0000 g | Freq: Once | INTRAVENOUS | Status: AC
  Administered 2013-05-11: 3 g via INTRAVENOUS
  Filled 2013-05-11: qty 6

## 2013-05-11 MED ORDER — POTASSIUM CHLORIDE CRYS ER 20 MEQ PO TBCR
40.0000 meq | EXTENDED_RELEASE_TABLET | Freq: Every day | ORAL | Status: DC
  Filled 2013-05-11: qty 2

## 2013-05-11 MED ORDER — PREDNISONE 20 MG PO TABS
20.0000 mg | ORAL_TABLET | Freq: Every day | ORAL | Status: DC
  Administered 2013-05-12 – 2013-05-13 (×2): 20 mg via ORAL
  Filled 2013-05-11 (×3): qty 1

## 2013-05-11 NOTE — Progress Notes (Deleted)
Clinical Social Work Department CLINICAL SOCIAL WORK PLACEMENT NOTE 05/11/2013  Patient:  Brenda Hogan, Brenda Hogan  Account Number:  [id number] Admit date:  05/01/2013  Clinical Social Worker:  Megan Salon  Date/time:  05/11/2013 10:10 AM  Clinical Social Work is seeking post-discharge placement for this patient at the following level of care:   SKILLED NURSING   (*CSW will update this form in Epic as items are completed)   05/11/2013  Patient/family provided with [place] Department of Clinical Social Work's list of facilities offering this level of care within the geographic area requested by the patient (or if unable, by the patient's family).  05/11/2013  Patient/family informed of their freedom to choose among providers that offer the needed level of care, that participate in Medicare, Medicaid or managed care program needed by the patient, have an available bed and are willing to accept the patient.  05/11/2013  Patient/family informed of [facility]' ownership interest in [facility], as well as of the fact that they are under no obligation to receive care at this facility.  PASARR submitted to EDS on 05/10/2013 PASARR number received from EDS on 05/10/2013  FL2 transmitted to all facilities in geographic area requested by pt/family on  05/10/2013 FL2 transmitted to all facilities within larger geographic area on   Patient informed that his/her managed care company has contracts with or will negotiate with  certain facilities, including the following:     Patient/family informed of bed offers received:   Patient chooses bed at  Physician recommends and patient chooses bed at    Patient to be transferred to  on   Patient to be transferred to facility by   The following physician request were entered in Epic:   Additional Comments:  Jeanette Caprice, MSW, [place]

## 2013-05-11 NOTE — Progress Notes (Signed)
Patient Name: Brenda Hogan Date of Encounter: 05/11/2013   Principal Problem:   Acute respiratory failure Active Problems:   Influenza A   NSTEMI (non-ST elevated myocardial infarction)   Sepsis   COPD with exacerbation   Acute combined systolic and diastolic CHF, NYHA class 1   HTN (hypertension)   Diabetes mellitus, type II   Acute encephalopathy   Hypokalemia   SUBJECTIVE  No chest pain.  Says breathing better.  Insistent about going home.  Expresses no interest in any further cardiac work-up.  CURRENT MEDS . aspirin  81 mg Oral Daily  . atorvastatin  40 mg Oral q1800  . furosemide  40 mg Intravenous Daily  . insulin aspart  0-5 Units Subcutaneous QHS  . insulin aspart  0-9 Units Subcutaneous TID WC  . insulin glargine  30 Units Subcutaneous Daily  . ipratropium-albuterol  3 mL Nebulization Q6H  . isosorbide-hydrALAZINE  1 tablet Oral TID  . metoprolol tartrate  25 mg Oral BID  . [START ON 05/12/2013] pneumococcal 23 valent vaccine  0.5 mL Intramuscular Tomorrow-1000  . potassium chloride  40 mEq Oral TID  . predniSONE  40 mg Oral Q breakfast   OBJECTIVE  Filed Vitals:   05/10/13 1930 05/10/13 1933 05/11/13 0233 05/11/13 0510  BP:  138/52  150/71  Pulse:  84  76  Temp:    97.3 F (36.3 C)  TempSrc:  Oral  Oral  Resp:  18  18  Height:      Weight:    197 lb 12.8 oz (89.721 kg)  SpO2: 96% 100% 100% 99%    Intake/Output Summary (Last 24 hours) at 05/11/13 0812 Last data filed at 05/11/13 0500  Gross per 24 hour  Intake    120 ml  Output   2650 ml  Net  -2530 ml   Filed Weights   05/09/13 0521 05/10/13 0356 05/11/13 0510  Weight: 193 lb 6.4 oz (87.726 kg) 197 lb 9.6 oz (89.631 kg) 197 lb 12.8 oz (89.721 kg)    PHYSICAL EXAM  General: Pleasant, NAD. Neuro: Alert and oriented X 3. Moves all extremities spontaneously. Psych: Normal affect. HEENT:  Normal  Neck: Supple without bruits.  Difficult to assess 2/2 girth. Lungs:  Resp regular and  unlabored, bibasilar crackles.  Coarse breath sounds throughout with insp/exp wheezing. Heart: RRR - distant and difficult to hear over breath sounds.  Abdomen: Soft, non-tender, non-distended, BS + x 4.  Extremities: No clubbing, cyanosis.  Trace bilat LE edema. DP/PT/Radials 2+ and equal bilaterally.  Accessory Clinical Findings  CBC  Recent Labs  05/10/13 0430 05/11/13 0425  WBC 15.2* 13.8*  HGB 10.7* 10.3*  HCT 32.1* 32.4*  MCV 86.8 87.6  PLT 296 809   Basic Metabolic Panel  Recent Labs  05/10/13 0430 05/11/13 0425  NA 141 143  K 3.2* 2.5*  CL 99 101  CO2 30 31  GLUCOSE 137* 147*  BUN 26* 18  CREATININE 0.63 0.65  CALCIUM 8.1* 7.7*   Hemoglobin A1C  Recent Labs  05/10/13 0430  HGBA1C 11.1*   TELE  Rsr, pac's, pvc's.  ASSESSMENT AND PLAN  1.  Acute Respiratory Failure/Influenza A/AECOPD:  Breathing improving but still with very coarse breath sounds and crackles.  Steroids/nebs per IM.  S/P tamiflu.  2.  Acute combined syst/diast CHF:  In setting of above.  EF 40-45%.  If accurate, weight is up 8 lbs since 1/2.  She does have bibasilar crackles.  Cont IV lasix,  bb, hydral/nitrate.  Consider acei.  3.  Type II NSTEMI:  In setting of above.  No chest pain.  She says this AM that she is not interested in either cath or stress testing.  Cont asa, statin, bb.  4.  Hypokalemia:  Supp.  5.  HTN:  Stable.  6.  DMII:  Per IM.  7.  Deconditioning:  SNF recommended at d/c.  Signed, Murray Hodgkins NP As above, patient seen and examined. The patient denies dyspnea or chest pain today. Discussed at length further cardiac workup. I recommended cardiac catheterization. The risks and benefits were discussed. I explained the risks of undiagnosed coronary disease including myocardial infarction and death. Patient does not want further cardiac workup at this point. Therefore continue medical therapy including aspirin, beta blocker and statin. Change heparin to DVT  prophylaxis. Continue gentle diuresis. Supplement potassium. Further therapy for pulmonary status her primary care. Kirk Ruths

## 2013-05-11 NOTE — Progress Notes (Signed)
[facility] PROGRESS NOTE  Leeah Politano DVV:[id number] DOB: [birth year] DOA: 05/01/2013 PCP: Ellamae Sia, MD  Assessment/Plan: #1 acute respiratory failure/severe acute COPD exacerbation/influenza A Status post extubation 05/07/2013. Secondary to severe acute exacerbation of COPD, influenza A, likely flash pulmonary edema. Clinical improvement. Continue steroid taper, nebulizer treatments, oxygen, lasix. Patient is status post Tamiflu and a course of antibiotics. Follow.  #2 NSTEMI Patient without any current chest pain. Cardiology following that considering the cardiac catheterization. Continue medical treatment with aspirin, Lipitor, Lasix, bidil, Lopressor. Patient on a heparin drip. Per cardiology patient states does not want any further cardiac workup at this point. Patient will be continued on medical management per cardiology. Heparin will be changed to DVT prophylactic dose. Per cardiology.  #3 CHF exacerbation Cardiac enzymes were elevated. 2-D echo with EF of 40-45%. Left ventricle with mild global hypokinesis with mild to moderate apical hypokinesis. Currently on IV Lasix. Continue Lopressor, BiDil, aspirin, Lipitor. Cardiology following.  #4 acute COPD exacerbation Continue oxygen, nebs, steroid taper. Patient is status post antibiotic course. Follow.  #5 influenza A. Status post five-day course of Tamiflu.  #6 hypertension/Diabetes Stable. Continue Lasix, BiDil, Lopressor.  #7 hyperglycemia Hemoglobin A1c = 11.1. CBG 120 -225.  Continue Lantus. Sliding scale insulin.  #8 acute encephalopathy Resolved. Ativan as needed for anxiety.  #9 Hypokalemia Secondary to diuresis. Replete  #9 prophylaxis Heparin for DVT prophylaxis.  Code Status: DO NOT RESUSCITATE Family Communication: Updated patient no family at bedside. Disposition Plan: Skilled nursing facility versus home health when medically stable   Consultants:  Critical care  medicine  Cardiology  Procedures: OETT 12/26 >>> 12/28  OGT 12/26 >>> 12/28  Foley 12/26 >>>  OETT 12/29 >>12/31 (one way 12/26 Head CT >>> neg  12/28 echo> LVEF 40-45%, LVH (mild), mild MR, IVC dilated      Antibiotics: Ceftriaxone 12/26 x1  Vancomycin 12/26 >>> 12/28  Levaquin 12/27 >>>12/30  Tamiflu 12/26 >>> 12/31  CULTURES:  12/26 MRSA >>> neg  12/26 Blood >>>NGTD  12/26 Urine >>>neg  12/26 Trach aspirate >>>neg  12/26 Multoplex Virus PCR >>> influenza A H3    HPI/Subjective: Patient states she's feeling somewhat better. No complaints. Patient complaining of fatigue. Patient asking to go home.  Objective: Filed Vitals:   05/11/13 1351  BP: 146/79  Pulse:   Temp: 98 F (36.7 C)  Resp: 19    Intake/Output Summary (Last 24 hours) at 05/11/13 1424 Last data filed at 05/11/13 1230  Gross per 24 hour  Intake    720 ml  Output   5152 ml  Net  -4432 ml   Filed Weights   05/09/13 0521 05/10/13 0356 05/11/13 0510  Weight: 87.726 kg (193 lb 6.4 oz) 89.631 kg (197 lb 9.6 oz) 89.721 kg (197 lb 12.8 oz)    Exam:   General:  NAD  Cardiovascular: Regular rate rhythm no murmurs rubs or gallops  Respiratory: bibasilar crackles.  Abdomen: Soft, nontender, nondistended, positive bowel sounds.  Musculoskeletal: No clubbing cyanosis or edema  Data Reviewed: Basic Metabolic Panel:  Recent Labs Lab 05/07/13 0445 05/08/13 0415 05/09/13 0440 05/10/13 0430 05/11/13 0425  NA 149* 149* 145 141 143  K 3.4* 3.7 3.5* 3.2* 2.5*  CL 106 105 101 99 101  CO2 32 32 32 30 31  GLUCOSE 212* 117* 87 137* 147*  BUN 57* 46* 35* 26* 18  CREATININE 0.90 0.73 0.68 0.63 0.65  CALCIUM 7.7* 7.9* 8.0* 8.1* 7.7*  MG  --   --   --   --  1.7   Liver Function Tests: No results found for this basename: AST, ALT, ALKPHOS, BILITOT, PROT, ALBUMIN,  in the last 168 hours No results found for this basename: LIPASE, AMYLASE,  in the last 168 hours No results found for this  basename: AMMONIA,  in the last 168 hours CBC:  Recent Labs Lab 05/05/13 0500 05/06/13 0443 05/07/13 0445 05/08/13 0415 05/10/13 0430 05/11/13 0425  WBC 18.4* 16.5* 16.1* 17.3* 15.2* 13.8*  NEUTROABS 15.9* 14.5* 12.1* 12.6*  --   --   HGB 12.5 12.1 10.9* 11.3* 10.7* 10.3*  HCT 38.4 38.5 34.1* 35.2* 32.1* 32.4*  MCV 88.1 89.1 89.5 88.2 86.8 87.6  PLT 334 323 293 308 296 278   Cardiac Enzymes: No results found for this basename: CKTOTAL, CKMB, CKMBINDEX, TROPONINI,  in the last 168 hours BNP (last 3 results)  Recent Labs  05/01/13 2158  PROBNP 3477.0*   CBG:  Recent Labs Lab 05/10/13 1127 05/10/13 1651 05/10/13 2056 05/11/13 0552 05/11/13 1113  GLUCAP 144* 223* 246* 120* 148*    Recent Results (from the past 240 hour(s))  CULTURE, BLOOD (ROUTINE X 2)     Status: None   Collection Time    05/01/13  4:40 PM      Result Value Range Status   Specimen Description BLOOD ARM RIGHT   Final   Special Requests BOTTLES DRAWN AEROBIC ONLY 10CC   Final   Culture  Setup Time     Final   Value: 05/01/2013 20:43     Performed at [place]     Final   Value: NO GROWTH 5 DAYS     Performed at [facility]   Report Status 05/07/2013 FINAL   Final  CULTURE, BLOOD (ROUTINE X 2)     Status: None   Collection Time    05/01/13  4:45 PM      Result Value Range Status   Specimen Description BLOOD HAND RIGHT   Final   Special Requests BOTTLES DRAWN AEROBIC AND ANAEROBIC 10CC   Final   Culture  Setup Time     Final   Value: 05/01/2013 20:44     Performed at [place]     Final   Value: NO GROWTH 5 DAYS     Performed at [facility]   Report Status 05/07/2013 FINAL   Final  URINE CULTURE     Status: None   Collection Time    05/01/13  4:47 PM      Result Value Range Status   Specimen Description URINE, CATHETERIZED   Final   Special Requests NONE   Final   Culture  Setup Time     Final   Value: 05/02/2013 00:24      Performed at [place]     Final   Value: NO GROWTH     Performed at [facility]   Culture     Final   Value: NO GROWTH     Performed at [facility]   Report Status 05/03/2013 FINAL   Final  CULTURE, RESPIRATORY (NON-EXPECTORATED)     Status: None   Collection Time    05/01/13  6:35 PM      Result Value Range Status   Specimen Description TRACHEAL ASPIRATE   Final   Special Requests NONE   Final   Gram Stain     Final   Value: FEW WBC  PRESENT, PREDOMINANTLY PMN     RARE SQUAMOUS EPITHELIAL CELLS PRESENT     NO ORGANISMS SEEN     Performed at [facility]   Culture     Final   Value: Non-Pathogenic Oropharyngeal-type Flora Isolated.     Performed at [facility]   Report Status 05/04/2013 FINAL   Final  RESPIRATORY VIRUS PANEL     Status: Abnormal   Collection Time    05/01/13  9:28 PM      Result Value Range Status   Source - RVPAN NASAL SWAB   Corrected   Comment: CORRECTED ON 12/28 AT 2319: PREVIOUSLY REPORTED AS NASAL SWAB   Respiratory Syncytial Virus A NOT DETECTED   Final   Respiratory Syncytial Virus B NOT DETECTED   Final   Influenza A DETECTED (*)  Final   Influenza B NOT DETECTED   Final   Parainfluenza 1 NOT DETECTED   Final   Parainfluenza 2 NOT DETECTED   Final   Parainfluenza 3 NOT DETECTED   Final   Metapneumovirus NOT DETECTED   Final   Rhinovirus NOT DETECTED   Final   Adenovirus NOT DETECTED   Final   Influenza A H1 NOT DETECTED   Final   Influenza A H3 DETECTED (*)  Final   Comment: (NOTE)           Normal Reference Range for each Analyte: NOT DETECTED     Testing performed using the Luminex xTAG Respiratory Viral Panel test     kit.     This test was developed and its performance characteristics determined     by [facility]. It has not been cleared or approved by the [place]     Food and Drug Administration. This test is used for clinical purposes.     It should not be regarded as  investigational or for research. This     laboratory is certified under the [place] (CLIA) as qualified to perform high complexity     clinical laboratory testing.     Performed at [facility] PCR SCREENING     Status: None   Collection Time    05/01/13  9:28 PM      Result Value Range Status   MRSA by PCR NEGATIVE  NEGATIVE Final   Comment:            The GeneXpert MRSA Assay (FDA     approved for NASAL specimens     only), is one component of a     comprehensive MRSA colonization     surveillance program. It is not     intended to diagnose MRSA     infection nor to guide or     monitor treatment for     MRSA infections.  URINE CULTURE     Status: None   Collection Time    05/01/13  9:39 PM      Result Value Range Status   Specimen Description URINE, CATHETERIZED   Final   Special Requests NONE   Final   Culture  Setup Time     Final   Value: 05/02/2013 14:18     Performed at [place]     Final   Value: NO GROWTH     Performed at [facility]   Culture     Final   Value: NO GROWTH  Performed at [facility]   Report Status 05/03/2013 FINAL   Final  CULTURE, BLOOD (ROUTINE X 2)     Status: None   Collection Time    05/01/13  9:50 PM      Result Value Range Status   Specimen Description BLOOD RIGHT HAND   Final   Special Requests BOTTLES DRAWN AEROBIC ONLY 2CC   Final   Culture  Setup Time     Final   Value: 05/02/2013 12:50     Performed at [facility]   Culture     Final   Value: NO GROWTH 5 DAYS     Performed at [facility]   Report Status 05/08/2013 FINAL   Final  CULTURE, BLOOD (ROUTINE X 2)     Status: None   Collection Time    05/01/13  9:58 PM      Result Value Range Status   Specimen Description BLOOD RIGHT HAND   Final   Special Requests BOTTLES DRAWN AEROBIC ONLY 1CC   Final   Culture  Setup Time     Final   Value: 05/02/2013  12:50     Performed at [facility]   Culture     Final   Value: NO GROWTH 5 DAYS     Performed at [facility]   Report Status 05/08/2013 FINAL   Final  CLOSTRIDIUM DIFFICILE BY PCR     Status: None   Collection Time    05/10/13  6:41 PM      Result Value Range Status   C difficile by pcr NEGATIVE  NEGATIVE Final     Studies: No results found.  Scheduled Meds: . aspirin  81 mg Oral Daily  . atorvastatin  40 mg Oral q1800  . furosemide  40 mg Intravenous Daily  . heparin subcutaneous  5,000 Units Subcutaneous Q8H  . insulin aspart  0-5 Units Subcutaneous QHS  . insulin aspart  0-9 Units Subcutaneous TID WC  . insulin glargine  30 Units Subcutaneous Daily  . ipratropium-albuterol  3 mL Nebulization Q6H  . isosorbide-hydrALAZINE  1 tablet Oral TID  . magnesium sulfate 1 - 4 g bolus IVPB  3 g Intravenous Once  . metoprolol tartrate  25 mg Oral BID  . [START ON 05/12/2013] pneumococcal 23 valent vaccine  0.5 mL Intramuscular Tomorrow-1000  . potassium chloride  40 mEq Oral QID  . predniSONE  40 mg Oral Q breakfast   Continuous Infusions:    Principal Problem:   Acute respiratory failure Active Problems:   Sepsis   Influenza A   NSTEMI (non-ST elevated myocardial infarction)   COPD with exacerbation   Acute combined systolic and diastolic CHF, NYHA class 1   HTN (hypertension)   Diabetes mellitus, type II   Acute encephalopathy   Hypokalemia    Time spent: 6 minutes    Tilford Deaton M.D. [facility] Pager [phone]. If 7PM-7AM, please contact night-coverage at [email or web address], password [facility] 05/11/2013, 2:24 PM  LOS: 10 days

## 2013-05-11 NOTE — Progress Notes (Signed)
Inpatient Diabetes Program Recommendations  AACE/ADA: New Consensus Statement on Inpatient Glycemic Control (2013)  Target Ranges:  Prepandial:   less than 140 mg/dL      Peak postprandial:   less than 180 mg/dL (1-2 hours)      Critically ill patients:  140 - 180 mg/dL   Reason for Visit: Results for Brenda Hogan, Brenda Hogan (MRN [id number]) as of 05/11/2013 16:51  Ref. Range 05/10/2013 04:30  Hemoglobin A1C Latest Range: <5.7 % 11.1 (H)   Spoke to patient briefly regarding elevated A1C.  She states that she takes insulin consistently at home however when I asked what her CBG's are at home, she stated "I don't remember".  She says she checks them when she takes her insulin. Unclear whether patient is actually taking insulin daily at home. According to medication reconciliation, patient was taking Levemir 50-60 units daily prior to admit, however CBG's today were 120 and 148 mg/dL on Lantus 30 units daily.  Note that patient will be having home health after discharge.  Likely needs diabetes followed through home health.    Adah Perl, RN, BC-ADM Inpatient Diabetes Coordinator Pager [phone]

## 2013-05-11 NOTE — Progress Notes (Signed)
The Chaplain stopped by for an initial visit with the patient today but the patient was resting. A follow up visit with the patient will be carried out.  Chaplain Clista Bernhardt Chaney [place]

## 2013-05-11 NOTE — Progress Notes (Signed)
OT Cancellation Note and Discharge  Patient Details Name: Brenda Hogan MRN: [id number] DOB: 1934-06-26   Cancelled Treatment:     Pt is Medicare and D/C recommendation is SNF as well as per chart pt was bedbound pta. Will defer OT eval to SNF. Acute OT will sign off.  Almon Register [phone] 05/11/2013, 7:52 AM

## 2013-05-11 NOTE — Progress Notes (Signed)
Critical Lab value K-2.5    K. Schorr of [facility] paged and made aware of K level. Will continue to assess

## 2013-05-11 NOTE — Progress Notes (Addendum)
Clinical Social Work Department BRIEF PSYCHOSOCIAL ASSESSMENT 05/11/2013  Patient:  Brenda Hogan, Brenda Hogan     Account Number:  [id number]     Admit date:  05/01/2013  Clinical Social Worker:  Megan Salon  Date/Time:  05/11/2013 11:47 AM  Referred by:  Physician  Date Referred:  05/10/2013 Referred for  SNF Placement   Other Referral:   Interview type:  Patient Other interview type:    PSYCHOSOCIAL DATA Living Status:  FAMILY Admitted from facility:   Level of care:   Primary support name:  Brenda Hogan Primary support relationship to patient:  CHILD, ADULT Degree of support available:   Good    CURRENT CONCERNS Current Concerns  Post-Acute Placement   Other Concerns:    SOCIAL WORK ASSESSMENT / PLAN Clinical Social Worker received referral for SNF placement at d/c. Patient alert and oriented x4. CSW went into patient's room and explained reason for visit. Patient explained to social worker that she lives with her daughter and son in law and has support at home .Patient explained she wants to go home with home health and does not understand why someone called social services on her about lying in feces. CSW provided support and listened to patient. CSW asked permission to call patient's daughter, patient agreed. At this time, patient explained she wants to go home with home health and does not want a short term rehab stay. CSW passed this on to CM who will follow up with patient.   Assessment/plan status:  No Further Intervention Required Other assessment/ plan:   Information/referral to community resources:   SNF information    PATIENT'S/FAMILY'S RESPONSE TO PLAN OF CARE: Patient does not want to go to SNF and wants Home Health. CSW spoke to patient's daughter Brenda Hogan and explained [place] option. Patient's daughter agreeable to this, stating "mom is stubborn and does not let us help her." Patient's daughter asked if a social worker could come out to the house if they  find that [place] is not working. CSW stated the CM will follow up.        Brenda Hogan, MSW, [place]

## 2013-05-11 NOTE — Progress Notes (Signed)
Physical Therapy Treatment Patient Details Name: Brenda Hogan MRN: [id number] DOB: 1935/02/01 Today's Date: 05/11/2013 Time: 6578-4696 PT Time Calculation (min): 23 min  PT Assessment / Plan / Recommendation  History of Present Illness 79 yo with COPD, bed bound, found in respiratory distress, intubated by EMS. Troponin elevated upon arrival to ICU.Marland Kitchen   Influenza A positive   PT Comments   Encouraged pt to get to EOB, but unable to get her to transfer to chair.  Fear is major limitation.  Worked on sitting balance/tolerance at EOB.  Noted trunk to be significantly weak with pt unable to maintain upright posture for more than 15 secs.  Pt could benefit from SNF to build strength, though not sure how much she will work toward OOB activity given her bed bound status.   Follow Up Recommendations  SNF (expect pt to refuse.)     Does the patient have the potential to tolerate intense rehabilitation     Barriers to Discharge        Equipment Recommendations       Recommendations for Other Services    Frequency Min 3X/week   Progress towards PT Goals Progress towards PT goals: Not progressing toward goals - comment (too fearful to push herself)  Plan Current plan remains appropriate;Other (comment) (pt reports she will not be going to SNF)    Precautions / Restrictions Precautions Precautions: Fall Restrictions Weight Bearing Restrictions: No   Pertinent Vitals/Pain     Mobility  Bed Mobility Bed Mobility: Rolling Right;Rolling Left;Supine to Sit;Sit to Supine;Sitting - Scoot to Edge of Bed Rolling Right: 3: Mod assist;With rail Rolling Left: 3: Mod assist;With rail Supine to Sit: 1: +2 Total assist Supine to Sit: Patient Percentage: 40% Sitting - Scoot to Edge of Bed: 3: Mod assist Sit to Supine: 1: +2 Total assist Sit to Supine: Patient Percentage: 40% Details for Bed Mobility Assistance: Fear of falling keeps pt from assisting.  Not as resistive today.  Weak trunk  also  hinders her ability to assist Transfers Transfers: Not assessed (pt refused) Details for Transfer Assistance: pt refused OOB to chair Ambulation/Gait Ambulation/Gait Assistance: Not tested (comment) Stairs: No Wheelchair Mobility Wheelchair Mobility: No    Exercises     PT Diagnosis:    PT Problem List:   PT Treatment Interventions:     PT Goals (current goals can now be found in the care plan section) Acute Rehab PT Goals PT Goal Formulation: With patient Time For Goal Achievement: 05/22/13 Potential to Achieve Goals: Good  Visit Information  Last PT Received On: 05/11/13 Assistance Needed: +2 History of Present Illness: 79 yo with COPD, bed bound, found in respiratory distress, intubated by EMS. Troponin elevated upon arrival to ICU.Marland Kitchen   Influenza A positive    Subjective Data  Subjective: Maybe HHPT can get me to do something.Marland KitchenMarland KitchenNo OOB in a long time.   Cognition  Cognition Arousal/Alertness: Awake/alert Behavior During Therapy: Anxious Overall Cognitive Status: Within Functional Limits for tasks assessed Following Commands: Follows one step commands consistently    Balance  Balance Balance Assessed: Yes Static Sitting Balance Static Sitting - Balance Support: Right upper extremity supported;Left upper extremity supported;Feet supported Static Sitting - Level of Assistance: 4: Min assist;Other (comment) (min guard for short periods in seconds) Static Sitting - Comment/# of Minutes: 12 minutes working on balance and tolerance.  Pt unable to sit fully upright for more than 15 secs before slumping.  End of Session PT - End of Session Activity Tolerance:  Patient tolerated treatment well Patient left: in bed;with call bell/phone within reach Nurse Communication: Mobility status   GP     Brenda Hogan, [name] 05/11/2013, 3:51 PM

## 2013-05-11 NOTE — Progress Notes (Signed)
Dr. Grandville Silos paged made aware of K-2.5

## 2013-05-12 ENCOUNTER — Inpatient Hospital Stay (HOSPITAL_COMMUNITY): Payer: Medicare Other

## 2013-05-12 LAB — BLOOD GAS, ARTERIAL
ACID-BASE EXCESS: 3.1 mmol/L — AB (ref 0.0–2.0)
Bicarbonate: 28.1 mEq/L — ABNORMAL HIGH (ref 20.0–24.0)
DRAWN BY: 31996
FIO2: 3 %
O2 Saturation: 97.4 %
PATIENT TEMPERATURE: 98.6
PCO2 ART: 50.3 mmHg — AB (ref 35.0–45.0)
TCO2: 29.6 mmol/L (ref 0–100)
pH, Arterial: 7.365 (ref 7.350–7.450)
pO2, Arterial: 95.7 mmHg (ref 80.0–100.0)

## 2013-05-12 LAB — GLUCOSE, CAPILLARY
GLUCOSE-CAPILLARY: 237 mg/dL — AB (ref 70–99)
Glucose-Capillary: 164 mg/dL — ABNORMAL HIGH (ref 70–99)
Glucose-Capillary: 214 mg/dL — ABNORMAL HIGH (ref 70–99)
Glucose-Capillary: 322 mg/dL — ABNORMAL HIGH (ref 70–99)
Glucose-Capillary: 299 mg/dL — ABNORMAL HIGH (ref 70–99)

## 2013-05-12 LAB — CBC
HCT: 35.8 % — ABNORMAL LOW (ref 36.0–46.0)
Hemoglobin: 11.6 g/dL — ABNORMAL LOW (ref 12.0–15.0)
MCH: 28.7 pg (ref 26.0–34.0)
MCHC: 32.4 g/dL (ref 30.0–36.0)
MCV: 88.6 fL (ref 78.0–100.0)
PLATELETS: 286 10*3/uL (ref 150–400)
RBC: 4.04 MIL/uL (ref 3.87–5.11)
RDW: 13.8 % (ref 11.5–15.5)
WBC: 19.7 10*3/uL — ABNORMAL HIGH (ref 4.0–10.5)

## 2013-05-12 LAB — BASIC METABOLIC PANEL
BUN: 15 mg/dL (ref 6–23)
CO2: 30 mEq/L (ref 19–32)
Calcium: 7.9 mg/dL — ABNORMAL LOW (ref 8.4–10.5)
Chloride: 98 mEq/L (ref 96–112)
Creatinine, Ser: 0.65 mg/dL (ref 0.50–1.10)
GFR calc Af Amer: >90 mL/min (ref >90)
GFR, EST NON AFRICAN AMERICAN: 84 mL/min — AB (ref >90)
GLUCOSE: 218 mg/dL — AB (ref 70–99)
POTASSIUM: 3.3 meq/L — AB (ref 3.7–5.3)
SODIUM: 138 meq/L (ref 137–147)

## 2013-05-12 LAB — COMPREHENSIVE METABOLIC PANEL
ALBUMIN: 2.7 g/dL — AB (ref 3.5–5.2)
ALT: 29 U/L (ref 0–35)
AST: 42 U/L — ABNORMAL HIGH (ref 0–37)
Alkaline Phosphatase: 72 U/L (ref 39–117)
BUN: 17 mg/dL (ref 6–23)
CALCIUM: 8 mg/dL — AB (ref 8.4–10.5)
CO2: 29 mEq/L (ref 19–32)
CREATININE: 0.72 mg/dL (ref 0.50–1.10)
Chloride: 97 mEq/L (ref 96–112)
GFR calc Af Amer: >90 mL/min (ref >90)
GFR calc non Af Amer: 81 mL/min — ABNORMAL LOW (ref >90)
Glucose, Bld: 330 mg/dL — ABNORMAL HIGH (ref 70–99)
Potassium: 4.4 mEq/L (ref 3.7–5.3)
Sodium: 139 mEq/L (ref 137–147)
TOTAL PROTEIN: 6.7 g/dL (ref 6.0–8.3)
Total Bilirubin: 0.7 mg/dL (ref 0.3–1.2)

## 2013-05-12 LAB — PRO B NATRIURETIC PEPTIDE: Pro B Natriuretic peptide (BNP): 3375 pg/mL — ABNORMAL HIGH (ref 0–450)

## 2013-05-12 LAB — TROPONIN I

## 2013-05-12 LAB — MAGNESIUM: Magnesium: 2.2 mg/dL (ref 1.5–2.5)

## 2013-05-12 MED ORDER — INSULIN DETEMIR 100 UNIT/ML ~~LOC~~ SOLN: 30.0000 [IU] | Freq: Every day | SUBCUTANEOUS | Status: DC

## 2013-05-12 MED ORDER — FUROSEMIDE 10 MG/ML IJ SOLN
INTRAMUSCULAR | Status: AC
  Filled 2013-05-12: qty 8

## 2013-05-12 MED ORDER — ASPIRIN 81 MG PO CHEW: 81.0000 mg | CHEWABLE_TABLET | Freq: Every day | ORAL | Status: DC

## 2013-05-12 MED ORDER — IPRATROPIUM-ALBUTEROL 0.5-2.5 (3) MG/3ML IN SOLN
3.0000 mL | Freq: Two times a day (BID) | RESPIRATORY_TRACT | Status: DC
  Administered 2013-05-12 (×2): 3 mL via RESPIRATORY_TRACT
  Filled 2013-05-12 (×2): qty 3

## 2013-05-12 MED ORDER — IPRATROPIUM-ALBUTEROL 0.5-2.5 (3) MG/3ML IN SOLN: 3.0000 mL | Freq: Two times a day (BID) | RESPIRATORY_TRACT | Status: AC

## 2013-05-12 MED ORDER — METOPROLOL TARTRATE 50 MG PO TABS
50.0000 mg | ORAL_TABLET | Freq: Two times a day (BID) | ORAL | Status: DC
Start: 2013-05-12 — End: 2013-05-13
  Administered 2013-05-12 – 2013-05-13 (×3): 50 mg via ORAL
  Filled 2013-05-12 (×4): qty 1

## 2013-05-12 MED ORDER — POTASSIUM CHLORIDE CRYS ER 20 MEQ PO TBCR
20.0000 meq | EXTENDED_RELEASE_TABLET | Freq: Every day | ORAL | Status: DC
  Administered 2013-05-12 – 2013-05-13 (×2): 20 meq via ORAL
  Filled 2013-05-12: qty 1

## 2013-05-12 MED ORDER — ALBUTEROL SULFATE HFA 108 (90 BASE) MCG/ACT IN AERS: 2.0000 | INHALATION_SPRAY | Freq: Four times a day (QID) | RESPIRATORY_TRACT | Status: AC | PRN

## 2013-05-12 MED ORDER — LISINOPRIL 2.5 MG PO TABS: 2.5000 mg | ORAL_TABLET | Freq: Every day | ORAL | Status: DC

## 2013-05-12 MED ORDER — FUROSEMIDE 40 MG PO TABS: 40.0000 mg | ORAL_TABLET | Freq: Every day | ORAL | Status: DC

## 2013-05-12 MED ORDER — FUROSEMIDE 40 MG PO TABS
40.0000 mg | ORAL_TABLET | Freq: Every day | ORAL | Status: DC
  Administered 2013-05-12: 40 mg via ORAL
  Filled 2013-05-12: qty 1

## 2013-05-12 MED ORDER — GLUCERNA SHAKE PO LIQD
237.0000 mL | Freq: Two times a day (BID) | ORAL | Status: DC
  Administered 2013-05-13: 237 mL via ORAL

## 2013-05-12 MED ORDER — FUROSEMIDE 10 MG/ML IJ SOLN
60.0000 mg | Freq: Once | INTRAMUSCULAR | Status: AC
  Administered 2013-05-12: 60 mg via INTRAVENOUS

## 2013-05-12 MED ORDER — GLUCERNA SHAKE PO LIQD: 237.0000 mL | Freq: Two times a day (BID) | ORAL | Status: AC

## 2013-05-12 MED ORDER — ALBUTEROL SULFATE (2.5 MG/3ML) 0.083% IN NEBU
2.5000 mg | INHALATION_SOLUTION | Freq: Four times a day (QID) | RESPIRATORY_TRACT | Status: DC | PRN
  Administered 2013-05-12: 2.5 mg via RESPIRATORY_TRACT
  Filled 2013-05-12: qty 3

## 2013-05-12 MED ORDER — ATORVASTATIN CALCIUM 40 MG PO TABS: 40.0000 mg | ORAL_TABLET | Freq: Every day | ORAL | Status: DC

## 2013-05-12 MED ORDER — SODIUM CHLORIDE 0.9 % IJ SOLN
10.0000 mL | Freq: Two times a day (BID) | INTRAMUSCULAR | Status: DC
  Administered 2013-05-12: 10 mL

## 2013-05-12 MED ORDER — LISINOPRIL 2.5 MG PO TABS
2.5000 mg | ORAL_TABLET | Freq: Every day | ORAL | Status: DC
  Administered 2013-05-12 – 2013-05-13 (×2): 2.5 mg via ORAL
  Filled 2013-05-12 (×3): qty 1

## 2013-05-12 MED ORDER — FUROSEMIDE 10 MG/ML IJ SOLN
60.0000 mg | Freq: Two times a day (BID) | INTRAMUSCULAR | Status: DC
  Administered 2013-05-12: 60 mg via INTRAVENOUS
  Filled 2013-05-12 (×2): qty 6

## 2013-05-12 MED ORDER — METOPROLOL TARTRATE 50 MG PO TABS: 50.0000 mg | ORAL_TABLET | Freq: Two times a day (BID) | ORAL | Status: AC

## 2013-05-12 MED ORDER — PREDNISONE 20 MG PO TABS: 20.0000 mg | ORAL_TABLET | Freq: Every day | ORAL | Status: AC

## 2013-05-12 MED ORDER — SODIUM CHLORIDE 0.9 % IJ SOLN
10.0000 mL | INTRAMUSCULAR | Status: DC | PRN
  Administered 2013-05-12: 20 mL
  Administered 2013-05-12: 10 mL

## 2013-05-12 MED ORDER — ALPRAZOLAM 0.25 MG PO TABS: 0.2500 mg | ORAL_TABLET | Freq: Two times a day (BID) | ORAL | Status: DC | PRN

## 2013-05-12 MED ORDER — ISOSORB DINITRATE-HYDRALAZINE 20-37.5 MG PO TABS: 1.0000 | ORAL_TABLET | Freq: Three times a day (TID) | ORAL | Status: DC

## 2013-05-12 MED ORDER — FUROSEMIDE 10 MG/ML IJ SOLN
INTRAMUSCULAR | Status: AC
  Administered 2013-05-12: 60 mg via INTRAVENOUS
  Filled 2013-05-12: qty 8

## 2013-05-12 MED ORDER — POTASSIUM CHLORIDE CRYS ER 20 MEQ PO TBCR: 20.0000 meq | EXTENDED_RELEASE_TABLET | Freq: Every day | ORAL | Status: DC

## 2013-05-12 NOTE — Discharge Summary (Signed)
Physician Discharge Summary  Brenda Hogan [id number] DOB: 19-Aug-1934 DOA: 05/01/2013  PCP: Ellamae Sia, MD  Admit date: 05/01/2013 Discharge date: 05/12/2013  Time spent: 70 minutes  Recommendations for Outpatient Follow-up:  1. Follow up with Burns, Harriett P, MD in 1 week. On followup basic metabolic profile need to be obtained to followup on patient's electrolytes and renal function. Patient's diabetes also need to be reassessed. 2. Followup with Dr. Stanford Breed of cardiology 2 weeks post discharge.  Discharge Diagnoses:  Principal Problem:   Acute respiratory failure Active Problems:   Sepsis   Influenza A   NSTEMI (non-ST elevated myocardial infarction)   COPD with exacerbation   Acute combined systolic and diastolic CHF, NYHA class 1   HTN (hypertension)   Diabetes mellitus, type II   Acute encephalopathy   Hypokalemia   Discharge Condition: Stable and improved  Diet recommendation: Heart healthy  Filed Weights   05/10/13 0356 05/11/13 0510 05/12/13 0440  Weight: 89.631 kg (197 lb 9.6 oz) 89.721 kg (197 lb 12.8 oz) 87.862 kg (193 lb 11.2 oz)    History of present illness:  79 year old obese female, bed ridden x 7 years with prior smoking and hx copd nos. She can transfer with assist only due to "bad knees". Has has exposure to non-flu resp virus amongst grand and great grand kids last few days. She did have flu shot. Was well until thiis morning when grand daughter noticed increaed cough and congestion in morning with less appeptie. By late afternnon patient was getting more somnolent and rapildy becaome cyanotic and unresponsive. Blood suyar 380mg %. EMS found aptient unresponsive. Patient intubated in ER with size 6.0 et tube. Never lost pulse. Per family similar episode in past due to UTI and high sugar. Initialy concern at ER was UTI but urine clear. Post intubaion patient febrile and ABG showed significant acute resp acidosis post intubaion suggesting  AECOPD. PCCM admitting 05/01/2013  Note: foul body odor across all room   Hospital Course:  #1 acute respiratory failure/severe acute COPD exacerbation/influenza A  Patient was admitted with acute respiratory failure as had presented with increased cough congestion was becoming cyanotic and became unresponsive. Patient was found by EMS unresponsive and patient intubated in the ED. Patient was admitted to the critical care service and treated for acute COPD exacerbation as well as influenza. Influenza PCR which was done was positive for influenza A. Patient was treated with some Tamiflu was placed on empiric IV antibiotics, IV steroids, nebulizer treatments. Patient was also noted to go into flash pulmonary edema and placed on some IV diuretics. Cardiac enzymes which were drawn by positive and cardiology consultation was obtained. Patient improved clinically and was extubated initially on 05/03/2013 however went back into respiratory distress and was subsequently reintubated and finally extubated on 05/07/2013. Patient remained stable post extubation was subsequently transferred to the floor. Patient had received a course of antibiotics. Patient has also been treated adequately with Tamiflu. Patient was placed on nebs treatments oxygen IV Lasix as well as steroid taper. Patient improved clinically patient was insistent on being discharged home in at the skilled nursing facility and a such patient was discharged home in stable and improved condition on a steroid taper. Patient will followup with PCP as outpatient.  #2 NSTEMI  On admission patient was admitted with acute respiratory failure with severe acute COPD exacerbation and influenza A. On admission EKG which was done showed T-wave inversion in the lateral leads. Cardiac enzymes obtained were positive.  Felt like patient had a non-ST elevated MI likely secondary to systemic supply demand mismatch. Cardiology consultation was also obtained. 2-D echo was  subsequently ordered on patient started on IV heparin, aspirin, statin. Beta blocker was initially held as patient was admitted with an acute COPD exacerbation. It was felt per cardiology that patient will likely need an ischemic evaluation prior to discharge. 2-D echo which was done showed EF of 40-45% with some mild global hypokinesis and mild to moderate apical hypokinesis. Patient improved clinically and was subsequently transferred to the telemetry floor. Patient was maintained on IV Lasix, aspirin, heparin drip, Lipitor, bidil, and Lopressor was started. Patient was diuresed and was improving clinically. Patient did not have any further chest pain. Cardiology considered a cardiac catheterization however patient refused to have a cardiac catheterization done and did not want any further cardiac workup at this time. IV heparin was subsequently changed to prophylactic heparin and patient will be managed medically at this point in time. Patient will be discharged home with medical management and will followup with cardiology 2 weeks post discharge.  #3 CHF exacerbation  Patient was admitted with acute respiratory failure. Patient was noted to be an acute CHF exacerbation. Cardiac enzymes which was cycled were elevated. 2-D echo with EF of 40-45%. Left ventricle with mild global hypokinesis with mild to moderate apical hypokinesis. Patient was seen by cardiology and cardiac catheterization was recommended however patient refused to have any further cardiac workup done at this time. Patient was maintained on IV Lasix which was subsequently transitioned to oral Lasix. Patient was maintained on BiDil, aspirin, Lipitor and Lopressor started. Patient will followup with cardiology as outpatient. #4 acute COPD exacerbation  Patient was admitted in acute respiratory failure felt to be multifactorial and also secondary to an acute COPD exacerbation. Patient was initially intubated and on the critical care service.  Patient was placed on IV steroids, nebulizer treatments as well as IV antibiotics. Patient received a course of IV antibiotics. Patient was also placed on Tamiflu as was positive for influenza A. Post extubation patient was transferred to the telemetry floor. Patient's steroids were tapered is maintained on nebulizer treatments as well as oxygen. Patient be discharged home on a steroid taper and will followup with PCP as outpatient. #5 influenza A.  Status post five-day course of Tamiflu.  #6 hypertension/ Stable. Patient was maintained on Lasix, BiDil, Lopressor. Patient is to followup with PCP as outpatient. #7 hyperglycemia/diabetes mellitus  Hemoglobin A1c = 11.1. CBGs 120s -200s. Patient was maintained on 30 units of Lantus as well as a sliding scale insulin. Patient was discharged home on Lantus 30 units will need to followup with PCP as outpatient. #8 acute encephalopathy  Resolved. Ativan as needed for anxiety.  #9 Hypokalemia  Secondary to diuresis. Repleted     Procedures: OETT 12/26 >>> 12/28  OGT 12/26 >>> 12/28  Foley 12/26 >>>  OETT 12/29 >>12/31 (one way  12/26 Head CT >>> neg  12/28 echo> LVEF 40-45%, LVH (mild), mild MR, IVC dilated      Consultations: Critical care medicine  Cardiology     Discharge Exam: Filed Vitals:   05/12/13 0440  BP: 151/65  Pulse: 73  Temp: 98.2 F (36.8 C)  Resp: 20    General: NAD Cardiovascular: RRR Respiratory: Scattered diffuse rhonchi. Decreased breath sounds in the bases.  Discharge Instructions      Discharge Orders   Future Orders Complete By Expires   Diet - low sodium heart healthy  As directed    Discharge instructions  As directed    Comments:     Follow up with Burns, Harriett P, MD in 1 week. Follow up with Dr Stanford Breed in 2 weeks.   Increase activity slowly  As directed        Medication List    STOP taking these medications       benazepril 40 MG tablet  Commonly known as:  LOTENSIN      verapamil 240 MG (CO) 24 hr tablet  Commonly known as:  COVERA HS      TAKE these medications       albuterol 108 (90 BASE) MCG/ACT inhaler  Commonly known as:  PROVENTIL HFA;VENTOLIN HFA  Inhale 2 puffs into the lungs every 6 (six) hours as needed for wheezing or shortness of breath. 2 puffs 3 times daily x 3 days then use as needed.     ALPRAZolam 0.25 MG tablet  Commonly known as:  XANAX  Take 1 tablet (0.25 mg total) by mouth 2 (two) times daily as needed for anxiety.     aspirin 81 MG chewable tablet  Chew 1 tablet (81 mg total) by mouth daily.     atorvastatin 40 MG tablet  Commonly known as:  LIPITOR  Take 1 tablet (40 mg total) by mouth daily at 6 PM.     furosemide 40 MG tablet  Commonly known as:  LASIX  Take 1 tablet (40 mg total) by mouth daily.     insulin aspart 100 UNIT/ML injection  Commonly known as:  novoLOG  Inject into the skin 3 (three) times daily before meals. Sliding scale     insulin detemir 100 UNIT/ML injection  Commonly known as:  LEVEMIR  Inject 0.3 mLs (30 Units total) into the skin at bedtime. 50-60 units depending on blood sugar     ipratropium-albuterol 0.5-2.5 (3) MG/3ML Soln  Commonly known as:  DUONEB  Take 3 mLs by nebulization 2 (two) times daily.     isosorbide-hydrALAZINE 20-37.5 MG per tablet  Commonly known as:  BIDIL  Take 1 tablet by mouth 3 (three) times daily.     lisinopril 2.5 MG tablet  Commonly known as:  PRINIVIL,ZESTRIL  Take 1 tablet (2.5 mg total) by mouth daily.     metoprolol 50 MG tablet  Commonly known as:  LOPRESSOR  Take 1 tablet (50 mg total) by mouth 2 (two) times daily.     potassium chloride SA 20 MEQ tablet  Commonly known as:  K-DUR,KLOR-CON  Take 1 tablet (20 mEq total) by mouth daily.     predniSONE 20 MG tablet  Commonly known as:  DELTASONE  Take 1 tablet (20 mg total) by mouth daily with breakfast. Take 1 tablet (20mg ) daily x 2 days then stop.  Start taking on:  05/13/2013       No Known  Allergies Follow-up Information   Follow up with Ellamae Sia, MD. Schedule an appointment as soon as possible for a visit in 1 week.   Specialty:  Internal Medicine   Contact information:   [place] [address] [phone]       Follow up with Kirk Ruths, MD. Schedule an appointment as soon as possible for a visit in 2 weeks.   Specialty:  Cardiology   Contact information:   [address] [place] [address] [phone]        The results of significant  diagnostics from this hospitalization (including imaging, microbiology, ancillary and laboratory) are listed below for reference.    Significant Diagnostic Studies: Ct Head Wo Contrast  05/01/2013   CLINICAL DATA:  Altered mental status.  EXAM: CT HEAD WITHOUT CONTRAST  TECHNIQUE: Contiguous axial images were obtained from the base of the skull through the vertex without intravenous contrast.  COMPARISON:  None.  FINDINGS: Ventricles are normal in configuration. There is ventricular and sulcal enlargement reflecting mild to moderate atrophy.  No parenchymal masses or mass effect. No evidence of a cortical infarct. Patchy white matter hypoattenuation is noted most consistent with moderate chronic microvascular ischemic change.  No extra-axial masses or abnormal fluid collections.  There is no intracranial hemorrhage.  Visualized sinuses and mastoid air cells are clear.  IMPRESSION: No acute intracranial abnormalities.  Mild to moderate atrophy. Moderate chronic microvascular ischemic change.   Electronically Signed   By: Lajean Manes M.D.   On: 05/01/2013 20:54   Dg Chest Port 1 View  05/06/2013   CLINICAL DATA:  Intubation.  EXAM: PORTABLE CHEST - 1 VIEW  COMPARISON:  05/04/2013.  FINDINGS: Endotracheal tube, left IJ line, NG tube in stable position. Cardiomegaly with pulmonary venous distention and interim persistent pulmonary interstitial prominence with new  interstitial prominence left perihilar region. Small pleural effusions cannot be excluded. These findings consistent with congestive heart failure with pulmonary interstitial edema.  IMPRESSION: 1. Stable line and tube positions. 2. Congestive heart failure with bilateral pulmonary interstitial edema, these findings have progressed slightly from prior exam. Small pleural effusions cannot be excluded.   Electronically Signed   By: Marcello Moores  Register   On: 05/06/2013 08:02   Dg Chest Port 1 View  05/04/2013   CLINICAL DATA:  Respiratory insufficiency.  EXAM: PORTABLE CHEST - 1 VIEW 12:45 p.m.  COMPARISON:  05/04/2013 at 4:51 a.m.  FINDINGS: Endotracheal tube has been inserted and appears in good position. Central line is unchanged in good position. There is new slight interstitial edema on the right. Haziness at the lung bases is felt to represent small pleural effusions. Heart size and vascularity are normal.  IMPRESSION: Endotracheal tube in good position. Small bilateral effusions. Slight interstitial edema on the right.   Electronically Signed   By: Rozetta Nunnery M.D.   On: 05/04/2013 13:03   Dg Chest Port 1 View  05/04/2013   CLINICAL DATA:  Pulmonary infiltrate.  EXAM: PORTABLE CHEST - 1 VIEW  COMPARISON:  05/02/2013.  FINDINGS: Interim removal of endotracheal tube and NG tube. Left IJ line in stable position. Cardiomegaly with mild pulmonary interstitial prominence present. Small pleural effusions cannot be excluded. Component of congestive heart failure is most likely present. No pneumothorax or acute osseous abnormality.  IMPRESSION: Findings suggesting mild congestive heart failure with pulmonary interstitial edema and small pleural effusions.   Electronically Signed   By: Marcello Moores  Register   On: 05/04/2013 06:44   Dg Chest Port 1 View  05/02/2013   CLINICAL DATA:  Central catheter placement  EXAM: PORTABLE CHEST - 1 VIEW  COMPARISON:  Study obtained earlier in the day.  FINDINGS: Endotracheal tube  tip is 5.8 cm above the carina. Central catheter tip is in the superior vena cava. Nasogastric tube tip and side port are in the stomach. No pneumothorax. There is no edema or consolidation. Heart size and pulmonary vascularity are normal. No adenopathy.  IMPRESSION: The tube and catheter positions are as described. Note that the new central catheter tip is in the superior vena cava.  No pneumothorax. No edema or consolidation.   Electronically Signed   By: Lowella Grip M.D.   On: 05/02/2013 10:49   Dg Chest Port 1 View  05/02/2013   CLINICAL DATA:  Respiratory failure.  EXAM: PORTABLE CHEST - 1 VIEW  COMPARISON:  05/01/2013  FINDINGS: Endotracheal tube present with the tip approximately 4 cm above the carina. Nasogastric tube extends below the diaphragm. Lungs show mild atelectasis of the left lower lobe. There is stable cardiomegaly. No pleural effusions are identified.  IMPRESSION: Mild left lower lobe atelectasis.   Electronically Signed   By: Aletta Edouard M.D.   On: 05/02/2013 08:35   Dg Chest Port 1 View  05/01/2013   CLINICAL DATA:  Evaluate endotracheal tube position.  EXAM: PORTABLE CHEST - 1 VIEW  COMPARISON:  05/01/2013 at 1651 hr  FINDINGS: Endotracheal tube tip now measures 4.6 cm above the carina.  New nasogastric tube passes below the diaphragm below the included field of view.  Cardiac silhouette is mildly enlarged. No mediastinal or hilar masses. Clear lungs.  IMPRESSION: Endotracheal tube tip now lies 4.6 cm above the carina. Nasogastric tube passes below the diaphragm. No other change from the prior study.   Electronically Signed   By: Lajean Manes M.D.   On: 05/01/2013 20:26   Dg Chest Portable 1 View  05/01/2013   CLINICAL DATA:  Assess endotracheal tube position.  EXAM: PORTABLE CHEST - 1 VIEW  COMPARISON:  None.  FINDINGS: The endotracheal tube tip lies 5 cm above the crotch of the carina. The lungs are mildly hyperinflated. There is no alveolar infiltrate. There is minimal  prominence of the interstitial markings bilaterally. The cardiopericardial silhouette is top-normal in size. The pulmonary vascularity is not engorged. There is no pleural effusion or pneumothorax. The observed portions of the bony thorax exhibit no acute abnormalities.  IMPRESSION: The endotracheal tube tip appears to be well-positioned. There is no evidence of pneumonia. Minimal prominence of the pulmonary interstitial markings is nonspecific.   Electronically Signed   By: David  Martinique   On: 05/01/2013 17:12   Dg Abd Portable 1v  05/01/2013   CLINICAL DATA:  Nasogastric tube placement.  EXAM: PORTABLE ABDOMEN - 1 VIEW  COMPARISON:  None.  FINDINGS: Nasogastric tube passes below the diaphragm into the proximal stomach. Consider inserting 5-10 additional cm to allow the tip to into the distal stomach.  Normal bowel gas pattern.  Clear lung bases.  IMPRESSION: Nasogastric tube tip is in the proximal stomach.   Electronically Signed   By: Lajean Manes M.D.   On: 05/01/2013 19:10    Microbiology: Recent Results (from the past 240 hour(s))  CLOSTRIDIUM DIFFICILE BY PCR     Status: None   Collection Time    05/10/13  6:41 PM      Result Value Range Status   C difficile by pcr NEGATIVE  NEGATIVE Final     Labs: Basic Metabolic Panel:  Recent Labs Lab 05/10/13 0430 05/11/13 0425 05/11/13 1330 05/11/13 2125 05/12/13 0555  NA 141 143 139 139 138  K 3.2* 2.5* 3.1* 3.9 3.3*  CL 99 101 97 97 98  CO2 30 31 30 29 30   GLUCOSE 137* 147* 203* 278* 218*  BUN 26* 18 15 16 15   CREATININE 0.63 0.65 0.65 0.65 0.65  CALCIUM 8.1* 7.7* 8.0* 8.1* 7.9*  MG  --  1.7  --   --   --    Liver Function Tests: No results found for this  basename: AST, ALT, ALKPHOS, BILITOT, PROT, ALBUMIN,  in the last 168 hours No results found for this basename: LIPASE, AMYLASE,  in the last 168 hours No results found for this basename: AMMONIA,  in the last 168 hours CBC:  Recent Labs Lab 05/06/13 0443 05/07/13 0445  05/08/13 0415 05/10/13 0430 05/11/13 0425  WBC 16.5* 16.1* 17.3* 15.2* 13.8*  NEUTROABS 14.5* 12.1* 12.6*  --   --   HGB 12.1 10.9* 11.3* 10.7* 10.3*  HCT 38.5 34.1* 35.2* 32.1* 32.4*  MCV 89.1 89.5 88.2 86.8 87.6  PLT 323 293 308 296 278   Cardiac Enzymes: No results found for this basename: CKTOTAL, CKMB, CKMBINDEX, TROPONINI,  in the last 168 hours BNP: BNP (last 3 results)  Recent Labs  05/01/13 2158  PROBNP 3477.0*   CBG:  Recent Labs Lab 05/11/13 0552 05/11/13 1113 05/11/13 1618 05/11/13 2119 05/12/13 0629  GLUCAP 120* 148* 225* 240* 164*       Signed:  THOMPSON,DANIEL M.D. [facility] 05/12/2013, 12:09 PM

## 2013-05-12 NOTE — Evaluation (Signed)
Occupational Therapy Evaluation Patient Details Name: Brenda Hogan MRN: [id number] DOB: Sep 02, 1934 Today's Date: 05/12/2013 Time: 4132-4401 OT Time Calculation (min): 40 min  OT Assessment / Plan / Recommendation History of present illness 79 yo with COPD, bed bound ("weeks" since she has been OOB), found in respiratory distress, intubated by EMS. Troponin elevated upon arrival to ICU.Marland Kitchen   Influenza A positive   Clinical Impression   Patient evaluated by Occupational Therapy with no further acute OT needs identified.  Recommend SNF for further rehab however patient declines and states that her family can continue to care for her at home.  Therefore, HHOT recommended.  All education has been completed and the patient has no further questions.   OT is signing off. Thank you for this referral.    OT Assessment  All further OT needs can be met in the next venue of care    Follow Up Recommendations  Home health OT (Refusing SNF despite recommendation)    Equipment Recommendations  None recommended by OT    Precautions / Restrictions Precautions Precautions: Fall Precaution Comments: poor sitting balance/endurance Restrictions Weight Bearing Restrictions: No   Pertinent Vitals/Pain Denies pain    ADL  Grooming: Simulated;Set up Where Assessed - Grooming: Supine, head of bed up Upper Body Bathing: Simulated;Set up Where Assessed - Upper Body Bathing: Supine, head of bed up Lower Body Bathing: Simulated;Maximal assistance Where Assessed - Lower Body Bathing: Supine, head of bed up;Rolling right and/or left;Lean right and/or left Upper Body Dressing: Simulated;Set up Where Assessed - Upper Body Dressing: Supine, head of bed up Lower Body Dressing: Simulated;Maximal assistance Where Assessed - Lower Body Dressing: Supine, head of bed up;Rolling right and/or left;Lean right and/or left Toilet Transfer:  (declined) Tub/Shower Transfer:  (N/A due to patient sponge bathed at  home) Transfers/Ambulation Related to ADLs: Patient declined OOB with OT, limited by fear of falling therfore requires increased encouragement for all mobility, roll, sidely><sit and sitting EOB ADL Comments: PTA patient required assist from family-no family present to confirm.  Patient reports that family can assist when she goes home.    OT Diagnosis: Generalized weakness  OT Problem List: Decreased strength;Decreased range of motion;Decreased activity tolerance;Impaired balance (sitting and/or standing);Decreased safety awareness;Decreased knowledge of use of DME or AE;Cardiopulmonary status limiting activity;Obesity   Visit Information  Last OT Received On: 05/12/13 Assistance Needed: +2 (+2 if OOB) History of Present Illness: 79 yo with COPD, bed bound ("weeks" since she has been OOB), found in respiratory distress, intubated by EMS. Troponin elevated upon arrival to ICU.Marland Kitchen   Influenza A positive       Prior Functioning     Home Living Family/patient expects to be discharged to:: Private residence Living Arrangements: Children;Other relatives Available Help at Discharge: Family;Available 24 hours/day Home Equipment: Bedside commode Additional Comments: Patient has not been out of bed for a long time "weeks"-probably more than 4 weeks. Bed baths with assist from family, using bed pad or bed pan for toileting.   Prior Function Level of Independence: Needs assistance (Unsure if patient good historian-"My family helped me with everything.") Communication Communication: No difficulties Dominant Hand: Right    Cognition  Cognition Arousal/Alertness: Awake/alert Behavior During Therapy: Anxious;WFL for tasks assessed/performed Overall Cognitive Status: No family/caregiver present to determine baseline cognitive functioning (Poor awareness of deficits or justs wants to go home so bad!) Following Commands: Follows one step commands consistently    Extremity/Trunk Assessment Upper  Extremity Assessment Upper Extremity Assessment: Generalized weakness Lower Extremity Assessment  Lower Extremity Assessment: Defer to PT evaluation     Mobility Bed Mobility Bed Mobility: Rolling Right;Rolling Left;Sit to Supine;Sitting - Scoot to Edge of Bed;Left Sidelying to Sit;Sit to Sidelying Left Rolling Right: With rail;4: Min assist Rolling Left: 3: Mod assist;With rail Left Sidelying to Sit: HOB elevated;With rails;3: Mod assist Sitting - Scoot to Edge of Bed: 2: Max assist Sit to Sidelying Left: 2: Max assist Details for Bed Mobility Assistance: Fear of falling keeps pt from assisting. "I can't" then after cues and encouragement she is able to assist more that she originally thought.  "I did better than I thought" Overall weakness hinders her ability to assist Transfers Details for Transfer Assistance: pt refused OOB to chair     End of Session OT - End of Session Equipment Utilized During Treatment: Oxygen Activity Tolerance: Patient tolerated treatment well Patient left: in bed;with call bell/phone within reach  [place], [place] 05/12/2013, 12:44 PM

## 2013-05-12 NOTE — Progress Notes (Addendum)
NUTRITION FOLLOW UP  Intervention:   Glucerna Shake po BID, each supplement provides 220 kcal and 10 grams of protein RD to follow for nutrition care plan  Nutrition Dx:   Inadequate oral intake now related to decreased appetite as evidenced by PO intake 25-100%, ongoing  New Goal:   Pt to meet >/= 90% of their estimated nutrition needs, progressing  Monitor:   PO & supplemental intake, weight, labs, I/O's  Assessment:   Obese 79 yo female with COPD, bed bound, found in respiratory distress, intubated by EMS, admitted on 12/26. Found to have influenza A.  Patient extubated 1/1.  Transferred to 2W-Cardiac from 10M-Medical ICU 1/2.  Vital HP formula discontinued.  Diet advanced to Carbohydrate Modified Medium Calorie post extubation.  Patient reports her appetite is "better than what it was".  PO intake variable 25-100% per flowsheet records.  Amenable to Glucerna Shake supplements -- RD to order.  Height: Ht Readings from Last 1 Encounters:  05/01/13 5\' 2"  (1.575 m)    Weight Status:   Wt Readings from Last 1 Encounters:  05/12/13 193 lb 11.2 oz (87.862 kg)  05/04/13  196 lb 6.9 oz (89.1 kg)  05/01/13  191 lb 6.4 oz (86.818 kg)   Body mass index is 35.42 kg/(m^2).  Re-estimated needs:  Kcal: 1800-2000 Protein: 90-100 gm Fluid: 1.8-2.0 L  Skin: Intact  Diet Order:    Intake/Output Summary (Last 24 hours) at 05/12/13 1133 Last data filed at 05/12/13 0800  Gross per 24 hour  Intake 1245.5 ml  Output   4851 ml  Net -3605.5 ml   Labs:   Recent Labs Lab 05/11/13 0425 05/11/13 1330 05/11/13 2125 05/12/13 0555  NA 143 139 139 138  K 2.5* 3.1* 3.9 3.3*  CL 101 97 97 98  CO2 31 30 29 30   BUN 18 15 16 15   CREATININE 0.65 0.65 0.65 0.65  CALCIUM 7.7* 8.0* 8.1* 7.9*  MG 1.7  --   --   --   GLUCOSE 147* 203* 278* 218*    CBG (last 3)   Recent Labs  05/11/13 1618 05/11/13 2119 05/12/13 0629  GLUCAP 225* 240* 164*    Scheduled Meds: . aspirin  81 mg  Oral Daily  . atorvastatin  40 mg Oral q1800  . furosemide  40 mg Oral Daily  . heparin subcutaneous  5,000 Units Subcutaneous Q8H  . insulin aspart  0-5 Units Subcutaneous QHS  . insulin aspart  0-9 Units Subcutaneous TID WC  . insulin glargine  30 Units Subcutaneous Daily  . ipratropium-albuterol  3 mL Nebulization BID  . isosorbide-hydrALAZINE  1 tablet Oral TID  . lisinopril  2.5 mg Oral Daily  . metoprolol tartrate  50 mg Oral BID  . pneumococcal 23 valent vaccine  0.5 mL Intramuscular Tomorrow-1000  . potassium chloride  20 mEq Oral Daily  . predniSONE  20 mg Oral Q breakfast    Continuous Infusions:    Arthur Holms, RD, LDN Pager #: [phone] After-Hours Pager #: [phone]

## 2013-05-12 NOTE — Progress Notes (Signed)
Patient Name: Brenda Hogan Date of Encounter: 05/12/2013   Principal Problem:   Acute respiratory failure Active Problems:   Sepsis   Influenza A   NSTEMI (non-ST elevated myocardial infarction)   COPD with exacerbation   Acute combined systolic and diastolic CHF, NYHA class 1   HTN (hypertension)   Diabetes mellitus, type II   Acute encephalopathy   Hypokalemia   SUBJECTIVE  No chest pain. No dyspnea  CURRENT MEDS . aspirin  81 mg Oral Daily  . atorvastatin  40 mg Oral q1800  . furosemide  40 mg Intravenous Daily  . heparin subcutaneous  5,000 Units Subcutaneous Q8H  . insulin aspart  0-5 Units Subcutaneous QHS  . insulin aspart  0-9 Units Subcutaneous TID WC  . insulin glargine  30 Units Subcutaneous Daily  . ipratropium-albuterol  3 mL Nebulization BID  . isosorbide-hydrALAZINE  1 tablet Oral TID  . metoprolol tartrate  25 mg Oral BID  . pneumococcal 23 valent vaccine  0.5 mL Intramuscular Tomorrow-1000  . potassium chloride  40 mEq Oral Daily  . predniSONE  20 mg Oral Q breakfast   OBJECTIVE  Filed Vitals:   05/11/13 1351 05/11/13 2047 05/11/13 2125 05/12/13 0440  BP: 146/79  162/63 151/65  Pulse:   89 73  Temp: 98 F (36.7 C)  98.1 F (36.7 C) 98.2 F (36.8 C)  TempSrc: Oral  Oral Oral  Resp: 19  20 20   Height:      Weight:    193 lb 11.2 oz (87.862 kg)  SpO2: 100% 100% 97% 99%    Intake/Output Summary (Last 24 hours) at 05/12/13 0715 Last data filed at 05/12/13 0451  Gross per 24 hour  Intake 1365.5 ml  Output   4853 ml  Net -3487.5 ml   Filed Weights   05/10/13 0356 05/11/13 0510 05/12/13 0440  Weight: 197 lb 9.6 oz (89.631 kg) 197 lb 12.8 oz (89.721 kg) 193 lb 11.2 oz (87.862 kg)    PHYSICAL EXAM  General: Pleasant, NAD. Neuro: Grossly intact HEENT:  Normal  Neck: Supple Lungs:  Decreased BS bases; diffuse rhonchi Heart: RRR  Abdomen: Soft, non-tender, non-distended Extremities: No edema  Accessory Clinical  Findings  CBC  Recent Labs  05/10/13 0430 05/11/13 0425  WBC 15.2* 13.8*  HGB 10.7* 10.3*  HCT 32.1* 32.4*  MCV 86.8 87.6  PLT 296 102   Basic Metabolic Panel  Recent Labs  05/11/13 0425 05/11/13 1330 05/11/13 2125  NA 143 139 139  K 2.5* 3.1* 3.9  CL 101 97 97  CO2 31 30 29   GLUCOSE 147* 203* 278*  BUN 18 15 16   CREATININE 0.65 0.65 0.65  CALCIUM 7.7* 8.0* 8.1*  MG 1.7  --   --    Hemoglobin A1C  Recent Labs  05/10/13 0430  HGBA1C 11.1*   TELE  Rsr, pac's, pvc's.  ASSESSMENT AND PLAN  1.  Acute Respiratory Failure/Influenza A/AECOPD:  Steroids/nebs per IM.  S/P tamiflu.  2.  Acute combined syst/diast CHF:  In setting of above.  EF 40-45%.  Change lasix to 40 mg daily; continue bb (increase lopressor to 50 BID, hydral/nitrate.  Add lisinopril to 2.5 mg daily.  3.  Type II NSTEMI:  In setting of above.  No chest pain.  She declines cath and understands risk of undiagnosed CAD including MI and death.  Cont asa, statin, bb.  4.  Hypokalemia:  Improved  5.  HTN:  BP mildly elevated; changes as outlined  above  6.  DMII:  Per IM.  7.  Deconditioning:  SNF recommended at d/c. We will sign off; please call with questions; patient can FU with me for cardiac issues 2-4 weeks after DC Signed, Ellis Parents

## 2013-05-12 NOTE — Progress Notes (Addendum)
At 1405 ---- Reported by RN Landis Martins that pt was attempting to get up, was sitting half way up on side of bed when she went into room. Pt had been on bedpan and was trying to get up. Pt was cleaned and assisted back up into bed, reported to be a little cyanotic when placed in reverse trendelenberg to pull up in bed, O2 on at 3L.  Upon assessment by this RN pt sat 97% ON 3L, diaphoretic, BP manual 186/94 R arm, HR 102,  Restless in bed, denies SOB, sounds congested in upper airway pt able to cough, RT called to come assess pt and give neb, RR approx 30, difficult to assess because pt keeps moving head back and forth(restless) , and moving arms and upper body.   CBG checked, 322.  Pt oriented to "[facility], [phone]", stated she was at [facility] when RT asked pt which hospital she was at.   Dr Grandville Silos notified of above information.  Stat PCXR ordered, will cancel discharge at this time.   Dr Grandville Silos in to see pt, RT present in room, stat ABG ordered and done by RT.

## 2013-05-12 NOTE — Progress Notes (Signed)
Pt resting, still in bed at this time. Denies SOB, unhappy that MD not letting her go home. BP 174/86, HR 77, sat 97% 3L O2., RR 20, has eyes closed resting, responds when spoken to. Xray has arrived to do PCXR.

## 2013-05-12 NOTE — Progress Notes (Signed)
Was called by nursing that patient on commode very weak, and lethargic. BP noted to be 186/94 with Sao2 95% on 3L and respiratory rate of 30 and sounding congested and SOB. Were then assessed patient. Patient laying on bed alert and following commands. Patient still insistent on being discharged home. Patient in mild respiratory distress using accessory muscles of respiration. Gen: In mild respiratory distress Respiratory: Bibasilar crackles. Coarse breath sounds. CV: Regular rate rhythm Abdomen: Soft, nontender, nondistended, positive bowel sounds. Extremities: No clubbing no cyanosis. Trace bilateral lower extremity edema.  Assessment/plan #1 acute respiratory distress Questionable etiology. A be secondary to flash pulmonary edema/CHF exacerbation. Patient noted to have a non-STEMI during this hospitalization however patient has refused any further cardiac workup included a cardiac catheterization. Will check a EKG. Check a stat ABG. Check a chest x-ray. Check a comprehensive metabolic profile. Check a CBC. Check a magnesium level. Cycle cardiac enzymes every 6 hours x3. Transferred to the step down unit. May need BiPAP. Will give a dose of Lasix 60 mg IV x1. Will follow.

## 2013-05-13 ENCOUNTER — Inpatient Hospital Stay (HOSPITAL_COMMUNITY): Payer: Medicare Other

## 2013-05-13 DIAGNOSIS — I10 Essential (primary) hypertension: Secondary | ICD-10-CM

## 2013-05-13 LAB — CBC
HCT: 34.9 % — ABNORMAL LOW (ref 36.0–46.0)
Hemoglobin: 11.4 g/dL — ABNORMAL LOW (ref 12.0–15.0)
MCH: 28.6 pg (ref 26.0–34.0)
MCHC: 32.7 g/dL (ref 30.0–36.0)
MCV: 87.5 fL (ref 78.0–100.0)
PLATELETS: 312 10*3/uL (ref 150–400)
RBC: 3.99 MIL/uL (ref 3.87–5.11)
RDW: 13.7 % (ref 11.5–15.5)
WBC: 19.5 10*3/uL — ABNORMAL HIGH (ref 4.0–10.5)

## 2013-05-13 LAB — GLUCOSE, CAPILLARY
GLUCOSE-CAPILLARY: 290 mg/dL — AB (ref 70–99)
Glucose-Capillary: 132 mg/dL — ABNORMAL HIGH (ref 70–99)
Glucose-Capillary: 210 mg/dL — ABNORMAL HIGH (ref 70–99)

## 2013-05-13 LAB — BASIC METABOLIC PANEL
BUN: 17 mg/dL (ref 6–23)
CALCIUM: 8.3 mg/dL — AB (ref 8.4–10.5)
CO2: 34 mEq/L — ABNORMAL HIGH (ref 19–32)
Chloride: 93 mEq/L — ABNORMAL LOW (ref 96–112)
Creatinine, Ser: 0.75 mg/dL (ref 0.50–1.10)
GFR calc Af Amer: >90 mL/min (ref >90)
GFR, EST NON AFRICAN AMERICAN: 80 mL/min — AB (ref >90)
Glucose, Bld: 258 mg/dL — ABNORMAL HIGH (ref 70–99)
Potassium: 4 mEq/L (ref 3.7–5.3)
SODIUM: 140 meq/L (ref 137–147)

## 2013-05-13 LAB — TROPONIN I: Troponin I: <0.3 ng/mL (ref <0.30)

## 2013-05-13 LAB — PRO B NATRIURETIC PEPTIDE: PRO B NATRI PEPTIDE: 3814 pg/mL — AB (ref 0–450)

## 2013-05-13 MED ORDER — FUROSEMIDE 10 MG/ML IJ SOLN
INTRAMUSCULAR | Status: AC
Start: 2013-05-13 — End: 2013-05-13
  Administered 2013-05-13: 60 mg
  Filled 2013-05-13: qty 8

## 2013-05-13 NOTE — Progress Notes (Addendum)
Patient's daughter called social worker back. Daughter, Olin Hauser, stated that patient's grandson is home and will be expecting her and it is okay to send her. Olin Hauser expressed concern about patient needing anxiety meds. CSW spoke with MD and explained to daughter to follow up with patient's PCP. Patient's daughter understood and thanked Education officer, museum for phone call. CSW left PTAR number with RN, who will call transport.  CSW attempted to call daughter. Left voicemail. CSW spoke with patient regarding dc. Patient stated she would be able to get into the house when she arrives because the door will be unlocked and her grandson is home. CSW asked patient for other phone numbers to family, but all patient could provide was the number for daughter Jeannene Patella, that social worker already tried and Patent attorney. Patient states it is fine to go ahead with the transport because she will be able to get inside.  Jeanette Caprice, MSW, [place]

## 2013-05-13 NOTE — Progress Notes (Signed)
Inpatient Diabetes Program Recommendations  AACE/ADA: New Consensus Statement on Inpatient Glycemic Control (2013)  Target Ranges:  Prepandial:   less than 140 mg/dL      Peak postprandial:   less than 180 mg/dL (1-2 hours)      Critically ill patients:  140 - 180 mg/dL  Results for Brenda Hogan, Brenda Hogan (MRN [id number]) as of 05/13/2013 12:55  Ref. Range 05/12/2013 06:29 05/12/2013 11:43 05/12/2013 14:06 05/12/2013 16:46 05/12/2013 21:05 05/13/2013 06:14 05/13/2013 11:29  Glucose-Capillary Latest Range: 70-99 mg/dL 164 (H) 214 (H) 322 (H) 299 (H) 237 (H) 132 (H) 290 (H)   Inpatient Diabetes Program Recommendations Insulin - Meal Coverage: consider Novolog 4 units TID wc for postprandial elevations secondary to steroid therapy Thank you  Raoul Pitch BSN, RN,CDE Inpatient Diabetes Coordinator [phone] (team pager)

## 2013-05-13 NOTE — Progress Notes (Signed)
Patient had been prepared for discharge yesterday when she developed acute onset shortness of breath, hypertension and tachycardia.  Today she reports that she feels she "paniced" and is now feeling much better.  CXR at the time of the episode showed increased pulmonary edema and bilateral effusions.  WBC's had increased from 24 to 19.   She was treated with one additional dose of lasix and improved quickly.  Today she is back to baseline respiratory status, blood pressure and heart rate.  She feels ready to go home.  Repeat CXR shows improved edema.  It is unclear if panic occurred first causing distress, tachycardia and flash edema or respiratory distress caused the panic.  WBC increase is likely reactive.  Services are in place for discharge, the patient is stable and ready for discharge today.  Will proceed with plan which had been established by Dr. Grandville Silos- see discharge summary for details.  GEN: alert, calm CV: rrr no mrg Plum: scattered crackles worse at bases, no distress ABD: bs+, soft, non tender

## 2013-05-13 NOTE — Progress Notes (Signed)
D/c instructions reviewed with pt. Copy placed in envelope for transporters to leave with pt and her family upon arrival to pt's home. Scripts were faxed to pt's pharmacy [facility] on [address] in [place], and hard copies placed in pt's envelope to go home with pt and take to the pharmacy. Pt d/c'd with her belongings, and  on O2 at 3L (O2 dependent at 3L, as previous prior to admit). Pt's family at home to receive her per pt states.

## 2014-05-07 ENCOUNTER — Ambulatory Visit: Payer: Self-pay | Admitting: Internal Medicine

## 2014-05-31 ENCOUNTER — Inpatient Hospital Stay: Payer: Self-pay | Admitting: Internal Medicine

## 2014-05-31 LAB — URINALYSIS, COMPLETE
Bacteria: NONE SEEN
Bilirubin,UR: NEGATIVE
Blood: NEGATIVE
Glucose,UR: NEGATIVE mg/dL (ref 0–75)
KETONE: NEGATIVE
Leukocyte Esterase: NEGATIVE
Nitrite: NEGATIVE
PH: 5 (ref 4.5–8.0)
Protein: NEGATIVE
RBC,UR: <1 /HPF (ref 0–5)
Specific Gravity: 1.006 (ref 1.003–1.030)

## 2014-05-31 LAB — CBC
HCT: 34.6 % — ABNORMAL LOW (ref 35.0–47.0)
HGB: 11 g/dL — ABNORMAL LOW (ref 12.0–16.0)
MCH: 28.1 pg (ref 26.0–34.0)
MCHC: 32 g/dL (ref 32.0–36.0)
MCV: 88 fL (ref 80–100)
Platelet: 402 10*3/uL (ref 150–440)
RBC: 3.93 10*6/uL (ref 3.80–5.20)
RDW: 14.3 % (ref 11.5–14.5)
WBC: 20.2 10*3/uL — AB (ref 3.6–11.0)

## 2014-05-31 LAB — COMPREHENSIVE METABOLIC PANEL
ALK PHOS: 76 U/L
Albumin: 3.3 g/dL — ABNORMAL LOW (ref 3.4–5.0)
Anion Gap: 7 (ref 7–16)
BILIRUBIN TOTAL: 0.5 mg/dL (ref 0.2–1.0)
BUN: 28 mg/dL — ABNORMAL HIGH (ref 7–18)
CHLORIDE: 97 mmol/L — AB (ref 98–107)
CO2: 29 mmol/L (ref 21–32)
Calcium, Total: 9.1 mg/dL (ref 8.5–10.1)
Creatinine: 1.14 mg/dL (ref 0.60–1.30)
GFR CALC AF AMER: 60 — AB
GFR CALC NON AF AMER: 49 — AB
Glucose: 224 mg/dL — ABNORMAL HIGH (ref 65–99)
OSMOLALITY: 279 (ref 275–301)
Potassium: 4 mmol/L (ref 3.5–5.1)
SGOT(AST): 17 U/L (ref 15–37)
SGPT (ALT): 11 U/L — ABNORMAL LOW
SODIUM: 133 mmol/L — AB (ref 136–145)
Total Protein: 8.3 g/dL — ABNORMAL HIGH (ref 6.4–8.2)

## 2014-05-31 LAB — PROTIME-INR
INR: 1
Prothrombin Time: 12.6 secs (ref 11.5–14.7)

## 2014-05-31 LAB — CK TOTAL AND CKMB (NOT AT ARMC)
CK, TOTAL: 75 U/L (ref 26–192)
CK-MB: 0.9 ng/mL (ref 0.5–3.6)

## 2014-05-31 LAB — TROPONIN I

## 2014-06-01 LAB — BASIC METABOLIC PANEL
Anion Gap: 9 (ref 7–16)
BUN: 31 mg/dL — ABNORMAL HIGH (ref 7–18)
CALCIUM: 8.6 mg/dL (ref 8.5–10.1)
CO2: 29 mmol/L (ref 21–32)
Chloride: 98 mmol/L (ref 98–107)
Creatinine: 1.15 mg/dL (ref 0.60–1.30)
EGFR (African American): 59 — ABNORMAL LOW
GFR CALC NON AF AMER: 49 — AB
GLUCOSE: 171 mg/dL — AB (ref 65–99)
OSMOLALITY: 283 (ref 275–301)
POTASSIUM: 3.9 mmol/L (ref 3.5–5.1)
Sodium: 136 mmol/L (ref 136–145)

## 2014-06-01 LAB — APTT: ACTIVATED PTT: 28.9 s (ref 23.6–35.9)

## 2014-06-01 LAB — LIPID PANEL
CHOLESTEROL: 316 mg/dL — AB (ref 0–200)
HDL: 38 mg/dL — AB (ref 40–60)
LDL CHOLESTEROL, CALC: 212 mg/dL — AB (ref 0–100)
TRIGLYCERIDES: 330 mg/dL — AB (ref 0–200)
VLDL Cholesterol, Calc: 66 mg/dL — ABNORMAL HIGH (ref 5–40)

## 2014-06-01 LAB — MAGNESIUM: Magnesium: 2.1 mg/dL

## 2014-06-01 LAB — TROPONIN I: Troponin-I: <0.02 ng/mL

## 2014-06-01 LAB — CK-MB: CK-MB: <0.5 ng/mL — ABNORMAL LOW (ref 0.5–3.6)

## 2014-06-01 LAB — HEMOGLOBIN A1C: HEMOGLOBIN A1C: 12.3 % — AB (ref 4.2–6.3)

## 2014-06-01 LAB — TSH: THYROID STIMULATING HORM: 3.09 u[IU]/mL

## 2014-06-07 ENCOUNTER — Ambulatory Visit: Payer: Self-pay | Admitting: Internal Medicine

## 2014-06-11 ENCOUNTER — Inpatient Hospital Stay: Payer: Self-pay | Admitting: Internal Medicine

## 2014-06-11 LAB — CBC
HCT: 34.9 % — ABNORMAL LOW (ref 35.0–47.0)
HGB: 11 g/dL — AB (ref 12.0–16.0)
MCH: 27.6 pg (ref 26.0–34.0)
MCHC: 31.5 g/dL — AB (ref 32.0–36.0)
MCV: 88 fL (ref 80–100)
PLATELETS: 524 10*3/uL — AB (ref 150–440)
RBC: 3.98 10*6/uL (ref 3.80–5.20)
RDW: 14.1 % (ref 11.5–14.5)
WBC: 38.5 10*3/uL — ABNORMAL HIGH (ref 3.6–11.0)

## 2014-06-11 LAB — URINALYSIS, COMPLETE
BACTERIA: NONE SEEN
Bilirubin,UR: NEGATIVE
Blood: NEGATIVE
Glucose,UR: NEGATIVE mg/dL (ref 0–75)
Hyaline Cast: 4
Ketone: NEGATIVE
LEUKOCYTE ESTERASE: NEGATIVE
NITRITE: NEGATIVE
Ph: 5 (ref 4.5–8.0)
Protein: NEGATIVE
Specific Gravity: 1.018 (ref 1.003–1.030)
Squamous Epithelial: 1

## 2014-06-11 LAB — COMPREHENSIVE METABOLIC PANEL
ANION GAP: 9 (ref 7–16)
Albumin: 3.1 g/dL — ABNORMAL LOW (ref 3.4–5.0)
Alkaline Phosphatase: 79 U/L (ref 46–116)
BUN: 45 mg/dL — ABNORMAL HIGH (ref 7–18)
Bilirubin,Total: 0.8 mg/dL (ref 0.2–1.0)
CO2: 27 mmol/L (ref 21–32)
Calcium, Total: 9.2 mg/dL (ref 8.5–10.1)
Chloride: 97 mmol/L — ABNORMAL LOW (ref 98–107)
Creatinine: 1.23 mg/dL (ref 0.60–1.30)
EGFR (Non-African Amer.): 45 — ABNORMAL LOW
GFR CALC AF AMER: 55 — AB
Glucose: 270 mg/dL — ABNORMAL HIGH (ref 65–99)
OSMOLALITY: 287 (ref 275–301)
POTASSIUM: 4.8 mmol/L (ref 3.5–5.1)
SGOT(AST): 16 U/L (ref 15–37)
SGPT (ALT): 10 U/L — ABNORMAL LOW (ref 14–63)
SODIUM: 133 mmol/L — AB (ref 136–145)
Total Protein: 8.3 g/dL — ABNORMAL HIGH (ref 6.4–8.2)

## 2014-06-11 LAB — CK TOTAL AND CKMB (NOT AT ARMC)
CK, Total: 23 U/L — ABNORMAL LOW (ref 26–192)
CK-MB: <0.5 ng/mL — ABNORMAL LOW (ref 0.5–3.6)

## 2014-06-11 LAB — TROPONIN I: Troponin-I: <0.02 ng/mL

## 2014-06-12 LAB — BASIC METABOLIC PANEL
Anion Gap: 7 (ref 7–16)
BUN: 44 mg/dL — ABNORMAL HIGH (ref 7–18)
Calcium, Total: 8.4 mg/dL — ABNORMAL LOW (ref 8.5–10.1)
Chloride: 103 mmol/L (ref 98–107)
Co2: 25 mmol/L (ref 21–32)
Creatinine: 1.12 mg/dL (ref 0.60–1.30)
EGFR (Non-African Amer.): 50 — ABNORMAL LOW
Glucose: 137 mg/dL — ABNORMAL HIGH (ref 65–99)
Osmolality: 283 (ref 275–301)
Potassium: 4.3 mmol/L (ref 3.5–5.1)
Sodium: 135 mmol/L — ABNORMAL LOW (ref 136–145)

## 2014-06-12 LAB — CBC WITH DIFFERENTIAL/PLATELET
BASOS ABS: 0.1 10*3/uL (ref 0.0–0.1)
Basophil %: 0.2 %
EOS ABS: 0 10*3/uL (ref 0.0–0.7)
Eosinophil %: 0.1 %
HCT: 33.2 % — AB (ref 35.0–47.0)
HGB: 10.7 g/dL — AB (ref 12.0–16.0)
LYMPHS ABS: 4.3 10*3/uL — AB (ref 1.0–3.6)
Lymphocyte %: 10.5 %
MCH: 28.2 pg (ref 26.0–34.0)
MCHC: 32.3 g/dL (ref 32.0–36.0)
MCV: 87 fL (ref 80–100)
Monocyte #: 1.5 x10 3/mm — ABNORMAL HIGH (ref 0.2–0.9)
Monocyte %: 3.6 %
Neutrophil #: 34.9 10*3/uL — ABNORMAL HIGH (ref 1.4–6.5)
Neutrophil %: 85.6 %
Platelet: 431 10*3/uL (ref 150–440)
RBC: 3.8 10*6/uL (ref 3.80–5.20)
RDW: 14.4 % (ref 11.5–14.5)
WBC: 40.8 10*3/uL — ABNORMAL HIGH (ref 3.6–11.0)

## 2014-06-13 LAB — BASIC METABOLIC PANEL
Anion Gap: 11 (ref 7–16)
BUN: 42 mg/dL — ABNORMAL HIGH (ref 7–18)
CALCIUM: 8.2 mg/dL — AB (ref 8.5–10.1)
Chloride: 107 mmol/L (ref 98–107)
Co2: 23 mmol/L (ref 21–32)
Creatinine: 0.98 mg/dL (ref 0.60–1.30)
GFR CALC NON AF AMER: 58 — AB
GLUCOSE: 159 mg/dL — AB (ref 65–99)
Osmolality: 295 (ref 275–301)
Potassium: 4.1 mmol/L (ref 3.5–5.1)
Sodium: 141 mmol/L (ref 136–145)

## 2014-06-13 LAB — CBC WITH DIFFERENTIAL/PLATELET
Basophil #: 0.1 10*3/uL (ref 0.0–0.1)
Basophil %: 0.2 %
Eosinophil #: 0.1 10*3/uL (ref 0.0–0.7)
Eosinophil %: 0.2 %
HCT: 32 % — AB (ref 35.0–47.0)
HGB: 10 g/dL — ABNORMAL LOW (ref 12.0–16.0)
LYMPHS ABS: 2.5 10*3/uL (ref 1.0–3.6)
Lymphocyte %: 8 %
MCH: 27.7 pg (ref 26.0–34.0)
MCHC: 31.3 g/dL — ABNORMAL LOW (ref 32.0–36.0)
MCV: 89 fL (ref 80–100)
Monocyte #: 1.1 x10 3/mm — ABNORMAL HIGH (ref 0.2–0.9)
Monocyte %: 3.5 %
NEUTROS PCT: 88.1 %
Neutrophil #: 27.6 10*3/uL — ABNORMAL HIGH (ref 1.4–6.5)
Platelet: 404 10*3/uL (ref 150–440)
RBC: 3.61 10*6/uL — AB (ref 3.80–5.20)
RDW: 14.4 % (ref 11.5–14.5)
WBC: 31.4 10*3/uL — ABNORMAL HIGH (ref 3.6–11.0)

## 2014-06-13 LAB — CULTURE, BLOOD (SINGLE)

## 2014-06-14 LAB — CBC WITH DIFFERENTIAL/PLATELET
BASOS ABS: 0 10*3/uL (ref 0.0–0.1)
Basophil %: 0.1 %
EOS ABS: 0.3 10*3/uL (ref 0.0–0.7)
Eosinophil %: 1.3 %
HCT: 30.5 % — ABNORMAL LOW (ref 35.0–47.0)
HGB: 9.7 g/dL — ABNORMAL LOW (ref 12.0–16.0)
Lymphocyte #: 2.6 10*3/uL (ref 1.0–3.6)
Lymphocyte %: 11.2 %
MCH: 28.1 pg (ref 26.0–34.0)
MCHC: 31.7 g/dL — ABNORMAL LOW (ref 32.0–36.0)
MCV: 89 fL (ref 80–100)
MONO ABS: 1 x10 3/mm — AB (ref 0.2–0.9)
Monocyte %: 4.4 %
NEUTROS ABS: 19.3 10*3/uL — AB (ref 1.4–6.5)
NEUTROS PCT: 83 %
Platelet: 412 10*3/uL (ref 150–440)
RBC: 3.43 10*6/uL — AB (ref 3.80–5.20)
RDW: 14.5 % (ref 11.5–14.5)
WBC: 23.3 10*3/uL — AB (ref 3.6–11.0)

## 2014-07-06 ENCOUNTER — Ambulatory Visit
Admit: 2014-07-06 | Disposition: A | Discharge status: Home / Self Care | Payer: Self-pay | Attending: Internal Medicine | Admitting: Internal Medicine

## 2014-09-05 NOTE — H&P (Signed)
PATIENT NAME:  Brenda, Hogan MR#:  [id number] DATE OF BIRTH:  [birth year]  DATE OF ADMISSION:  06/11/2014  PRIMARY CARE PHYSICIAN: Ellamae Sia, MD  REFERRING EMERGENCY ROOM PHYSICIAN: Algis Liming. Jimmye Norman, MD  CHIEF COMPLAINT: Drowsiness.   HISTORY OF PRESENTING ILLNESS: This is a 80 year old female who has history of anxiety, diabetes, COPD on oxygen at home, hyperlipidemia, hypertension, and a recent stroke when she was admitted on the January 25 with that and discharged home with left over right-sided weakness. She was taken home by daughter and grandson lives with her also. They take care of her at home. As per the grandson, who is my main source of history in the room, states that the patient is total care currently. She cannot move out of bed. She wears diapers and can feed herself, but having some swallowing difficulties since stroke. She was discharged home with home health aide, physical therapy, and a visiting nurse. As per her grandson, she does not eat much since the last 2-3 days and today he noted that she is more drowsy. He checked her blood sugar also, which was 180, but she remained drowsy, so he finally called ambulance. When they arrived the sugar was 300. They brought her to the Emergency Room and on workup she was noted having elevated white cell count 38,000 and chest x-ray showing pneumonia, so given to hospitalist team for further management.   REVIEW OF SYSTEMS: Unable to get it, as the patient is drowsy. History obtained from the patient's grandson, he denies any fever.   PAST MEDICAL HISTORY:  1.  Migraine headache.  2.  Anxiety.  3.  Osteoarthritis.  4.  Diabetes.  5.  COPD.  6.  Hyperlipidemia.  7.  Hypertension.  8.  Repair of quadriceps tendon of right knee.  9.  Cerebrovascular accident in January 2016.   SOCIAL HISTORY: No smoking. No alcohol. No illegal drug use. She is bedbound for a long time. Lives with daughter and grandson. Has nurse, physical  therapy, home health aide, and swallow technician coming at home after stroke last week.   FAMILY HISTORY: Hypertension runs in the family and the patient's mother had breast cancer and CHF.   HOME MEDICATIONS:  1.  Tylenol 500 mg oral every 6 hours as needed for pain.  2.  Rosuvastatin 10 mg oral once a day.  3.  Nystatin  3 times a day.  4.  NovoLog injection according to sliding scale.  5.  Lisinopril 5 mg once a day.  6.  Levofloxacin 750 mg oral daily.  7.  Insulin Levemir 30 units subcutaneous once a day.  8.  Fluticasone 1 puff 2 times a day.  9.  Lovenox 40 mg subcutaneous once a day.  10.  Plavix 75 mg once a day.  11.  Albuterol 2.5 mg every 6 hours inhalation as needed   PHYSICAL EXAMINATION:  VITAL SIGNS: In ER: Temperature 98, pulse is 106, respiration rate is ranging from 18-24, blood pressure 140/92, pulse oximetry is 96 with 2 L oxygen supplementation.  GENERAL: The patient is drowsy, but easily arousable, going in and out of wakefulness, but she is confused.  HEENT: Head and neck atraumatic. Conjunctivae are pink. Oral mucosa moist.  NECK: Supple. No JVD. Thyroid nontender.  RESPIRATORY: Bilateral equal air entry present, some crepitation right lower lobe. No wheezing.  CARDIOVASCULAR: S1, S2 present, regular. No murmur.  ABDOMEN: Soft, nontender. Bowel sounds present. No organomegaly.  SKIN: No acne, rashes, or  lesions.  MUSCULOSKELETAL: No pain or swelling in the joints. No tenderness.  NEUROLOGICAL: She is having weakness on the right side upper and lower extremity power is 1/5; left side she moves 2-3/5. Generalized weakness. No tremor. Sensation unable to check, as the patient is currently drowsy   IMPORTANT LABORATORY RESULTS:  1.  Glucose 270, BUN 45, creatinine 1.23, sodium 133, potassium 4.8, chloride 97, CO2 of 27, and calcium is 9.2.  2.  Total protein 8.3, albumin 3.1, bilirubin 0.8, alkaline phosphate 79, SGOT 16, SGPT is 10.  3.  WBC 38.5, hemoglobin 11,  platelet count 524,000, MCV is 88.  4.  Urinalysis is grossly negative.  5.  On ABG pH is 7.43, pCO2 of 40, and pO2 is 67 on 28% oxygen supplementation.  6.  CT of the head does not show any new stroke, has old stroke.  7.  Chest x-ray, portable, shows right lower lobe process, likely infiltrate and effusion.   ASSESSMENT AND PLAN: A 80 year old female with recent stroke and leftover right-sided weakness and some swallowing problem, came to the Emergency Room with complaint of drowsiness, and she is noted to have pneumonia on right lower lobe.  1.  Aspiration pneumonia: We will admit to medical floor. Give Unasyn to cover for aspiration pneumonia and get speech and swallow evaluation. Meanwhile, we will keep her n.p.o. until we evaluate further. Try to get sputum culture and blood culture to be followed.  2.  Altered mental status: Mostly this is because of pneumonia and we will continue monitoring.  3.  Diabetes: As the patient will not be eating much, we will keep her on insulin sliding scale coverage only and will not give any basal insulin at this time.  4.  History of chronic obstructive pulmonary disease and chronic oxygen use at home: We will continue oxygen. Currently, there is no wheezing or shortness of breath.  5.  Deep vein thrombosis prophylaxis: Lovenox 40 mg subcutaneous daily. She was also on this at home. We will continue that here.  6.  History of recent stroke: The patient was taking rosuvastatin and Plavix at home. We will continue that in the hospital.   CODE STATUS: Limited code. I discussed with patient's daughter, Ms. Pam, who is present in the room and healthcare power of attorney, as per her, the patient's wishes are to do CPR but not to put her on ventilator machine or intubate her, so we will respect those wishes.   TOTAL TIME SPENT ON THIS ADMISSION: 50 minutes.    ____________________________ Ceasar Lund Anselm Jungling, MD vgv:bm D: 06/11/2014 23:37:43  ET T: 06/12/2014 00:16:11 ET JOB#: [id number]  cc: Ceasar Lund. Anselm Jungling, MD, <Dictator> Ellamae Sia, MD Vaughan Basta MD ELECTRONICALLY SIGNED 06/23/2014 16:11

## 2014-09-05 NOTE — H&P (Signed)
Hogan NAME:  Brenda Hogan, Brenda Hogan MR#:  [id number] DATE OF BIRTH:  1934/08/02  DATE OF ADMISSION:  05/31/2014  PRIMARY CARE PHYSICIAN: Ellamae Sia, MD   REFERRING PHYSICIAN: Brunilda Payor A. Edd Fabian, MD  CHIEF COMPLAINT: Right-sided weakness.   HISTORY OF PRESENT ILLNESS: Brenda Hogan is a 80 year old female who noticed on Friday morning that Brenda Hogan could not hold her cup. Noticed to have right-sided weakness. Mentioned to Brenda family members; however, refused to come to Brenda Emergency Department. However, Brenda Hogan also has been having cough with productive sputum for Brenda last 1 month. Brenda Hogan was given antibiotics with some improvement. However, Brenda symptoms continued to get worse. Concerning about combination of these things, called EMS and was brought to Brenda Emergency Department. Workup in Brenda Emergency Department with a CT, head, without contrast is confirming left mid corona radiata and left basal ganglia new infarct. Per daughter,  Brenda Hogan has been bedbound for many years. Usually Brenda Hogan's daughter takes care of all her ADLs.   PAST MEDICAL HISTORY:  1.  Migraine headaches.  2.  Anxiety.  3.  Peripheral neuropathy.  4.  Osteoarthritis.  5.  Diabetes mellitus, insulin-dependent.  6.  COPD.  7.  Hyperlipidemia.  8.  Hypertension.  9.  Repair of Brenda quadriceps tendon of Brenda right knee.   ALLERGIES: LIPITOR.   HOME MEDICATIONS:  1.  Tylenol 500 mg every 6 hours as needed.  2.  NovoLog FlexPen 15 units 3 times a day.  3.  Lisinopril 2.5 mg daily.  4.  Levemir 55 units subcutaneous daily.  5.  Lasix 40 mg daily.  6.  Aspirin 81 mg daily.   SOCIAL HISTORY: Brenda Hogan denied any smoking; however, has strong secondhand smoking. Denies drinking any alcohol or using illicit drugs. Bedbound status. Lives with her daughter.   FAMILY HISTORY: Hypertension.   REVIEW OF SYSTEMS:  CONSTITUTIONAL: Experiencing generalized weakness.  EYES: No change in vision.  ENT: No change in  hearing.  RESPIRATORY: Has cough, shortness of breath.  CARDIOVASCULAR: No chest pain, palpitations.  GASTROINTESTINAL: No nausea, vomiting, abdominal pain.  GENITOURINARY: No dysuria or hematuria.  HEMATOLOGIC: No easy bruising or bleeding.  SKIN: No rash or lesions.  MUSCULOSKELETAL: Unable to move right side of Brenda body. Has generalized body aches. NEUROLOGIC: Weakness on Brenda right side of Brenda body.   PHYSICAL EXAMINATION:  GENERAL: This is an elderly-looking female, looks much older than stated age, lying down in Brenda bed, not in distress.  VITAL SIGNS: Temperature 98.3, pulse 95, blood pressure 140/57, respiratory rate of 20, oxygen saturation 97% on room air.  HEENT: Head normocephalic, atraumatic. There is no scleral icterus. Conjunctivae normal. Pupils equal and react to light. Mucous membranes moist. No pharyngeal erythema.  NECK: Supple. No lymphadenopathy. No JVD. No carotid bruit.  CHEST: Has no focal tenderness. Bilateral diffuse coarse breath sounds.  HEART: S1, S2 regular. No murmurs are heard.  ABDOMEN: Bowel sounds plus. Soft, nontender, nondistended.  EXTREMITIES: No pedal edema. Pulses 2+.  NEUROLOGIC: Brenda Hogan is alert, oriented to place, person, and time. Has a right facial droop, dense right hemiplegia.   LABORATORY DATA: CBC: WBC of 20,000, hemoglobin 11, platelet count of 402,000.   BMP: BUN 28, creatinine of 1.14. Chest x-ray, 1 view, portable: No acute cardiopulmonary disease.   UA negative for nitrites and leukocyte esterase.   ASSESSMENT AND PLAN: [name] is a 80 year old female who comes with a new-onset stroke of 5 days' duration with  dense right hemiplegia.  1.  cerebrovascular accident, acute with dense right hemiplegia. CT, head, without contrast already shows cerebrovascular accident of Brenda brain. Brenda Hogan does not want any procedures done. Concerning this, no need for further workup with Brenda carotid Dopplers and MRI of Brenda brain. We will check  Brenda echocardiogram to rule out any embolic phenomenon. Brenda Hogan has been bedbound for many years. It is unlikely Brenda Hogan is going to benefit adding any physical therapy or occupational therapy. However, will obtain a swallow evaluation to ensure Brenda safety of her p.o. intake. Keep Brenda Hogan on aspirin. Brenda Hogan is allergic to Lipitor. Will keep Brenda Hogan on Crestor and follow up. Brenda Hogan could not tell Brenda allergy.  Increase Brenda dose of aspirin to 325mg . 2.  Pneumonia. Brenda Hogan has elevated white blood cell count, cough with productive sputum. We will treat it as a community-acquired pneumonia with levofloxacin. There is a possibility of aspiration, as well; however, we will give levofloxacin if Hogan's WBC count does not improve. Will consider adding vancomycin as well.  3.  Hypertension. Currently well controlled.  4.  Severe debility. Brenda Hogan has been bedbound status. Brenda Hogan does not want any further workup to be done.  5.  Chronic obstructive pulmonary disease exacerbation. Continue with Brenda breathing treatments and Solu-Medrol. Brenda Hogan has extensive secondhand smoking exposure.  6.  Keep Brenda Hogan on deep vein thrombosis prophylaxis with Lovenox.  7.  Diabetes mellitus, insulin-dependent. Continue Brenda Levemir.   TIME SPENT: 55 minutes.    ____________________________ Monica Becton, MD pv:ST D: 05/31/2014 23:19:34 ET T: 05/31/2014 23:55:58 ET JOB#: [id number]  cc: Monica Becton, MD, <Dictator> Monica Becton MD ELECTRONICALLY SIGNED 06/01/2014 4:40

## 2014-09-05 NOTE — Consult Note (Signed)
   Comments   Daughter, Jeannene Patella, at bedside and agrees to goals of care discussion.  Pam states pt lives with other daughter, Janae Bridgeman, and pt's mother.  Daughter states that she is followed by [facility] home health.  They are very pleased with them and want for pt to continue to be followed by [facility].  Pt would qualify for hospice services but family declines.  Family would accept hospice services if they could have both, but understand this might not be possible.  Care management updated    Electronic Signatures: Maudry Mayhew (NP)  (Signed 08-Feb-16 13:38)  Authored: Palliative Care   Last Updated: 08-Feb-16 13:38 by Maudry Mayhew (NP)

## 2014-09-05 NOTE — Discharge Summary (Signed)
PATIENT NAME:  Brenda Hogan, Brenda Hogan MR#:  [id number] DATE OF BIRTH:  11/25/1934  DATE OF ADMISSION:  06/11/2014 DATE OF DISCHARGE:  06/18/2014  ADMITTING DIAGNOSIS: Aspiration pneumonia.   DISCHARGE DIAGNOSES: 1. Acute on chronic respiratory failure with hypoxia.  2. Respiration pneumonitis.  3. Dysphasia. 4. Metabolic encephalopathy due to above, which resolved. 5. Leukocytosis due to pneumonia, resolving.  6. History of diabetes mellitus type II, poorly controlled, now on insulin therapy, hypertension, hyperlipidemia, stroke with hemiparesis, anxiety, depression, osteoarthritis, migraine headaches, chronic obstructive pulmonary disease, chronic respiratory failure on 2 liters of oxygen through nasal cannula 24/7 at home.  DISCHARGE CONDITION: Stable.   DISCHARGE MEDICATIONS:  1. The patient is to continue Tylenol 500 mg every 6 hours as needed.  2. Lexapro 5 mg p.o. daily. 3. Enoxaparin 40 mg subcutaneously daily.  4. Insulin detemir 30 units subcutaneous daily. 5. Sliding-scale insulin. 6. Plavix 75 mg p.o. daily.  7. Albuterol nebulizer 2.5 mg every 6 hours while awake.  8. Crestor 10 mg p.o. daily.  9. Fluticasone 1 puff twice daily.  10. Nystatin 1 application 3 times daily.  11. Remeron 15 p.o. mg at bedtime.  12. Lorazepam 0.5 mg every 4 hours as needed.  13. Metoprolol tartrate 50 mg p.o. twice daily.  14. Amoxicillin clavulanate chewable tablets 400/57 one tablet 3 times daily for 5 more days to complete course.   HOME HEALTH: Physical therapy, occupational therapy, nurse, nurse's aide, and Education officer, museum.   HOME OXYGEN:  Oxygen 2 liters through nasal cannula.   DIET: Two-gram salt, low-fat, low-cholesterol, carbohydrate-controlled diet, mechanical soft with thin liquids, ground meat with gravy, to moisten pills 1 at a time with edges smoothened and given in puree.   ASPIRATION PRECAUTIONS. Feeding assistance with meals. Head forward and supportive during oral intake. Must  take small single sips from a cup.    ACTIVITY LIMITATIONS: As tolerated.   REFERRAL: To home health physical therapy, RN, aide, speech therapy, occupational therapy, and social.   FOLLOWUP APPOINTMENT: With Dr. Kingsley Spittle in 2 days after discharge.    CONSULTANTS: Care management, social work, and palliative care, Mr. Jeneen Rinks    RADIOLOGIC STUDIES: Chest x-ray, portable single view, 06/12/2014 revealed a right lower lobe processes, likely infiltrate and effusion. CT of head without contrast, 06/12/2014, showed no evidence of acute intracranial abnormality, chronic left coronal radiata and bilateral basal ganglia lacunar infarct, atrophy with small vessel ischemic changes and intracranial atherosclerosis. CT of head without contrast, 06/12/2014, showed again, chronic changes with no acute findings., Chest x-ray portable single view, 06/14/2014, revealed a very limited frontal chest radiograph with the patient significantly rotated toward the right, increasing airspace opacity in the left lung base concerning for pneumonia given the clinical history, moderate left pleural effusion was suspected, small layering right pleural effusion, and associated right basilar atelectasis. Cardiomegaly as well as atherosclerotic and ectatic thoracic aorta. CT scan of upper chest without contrast of contrast, 06/17/2014, revealing bilateral lower lobe predominant air space disease suspicious for pneumonia, small bilateral pleural effusions, mild mediastinal lymphadenopathy with azygoesophageal recess, which may be reactive in etiology, indeterminate low attenuation lesion in lateral segment of left hepatic lobe. Follow-up chest and abdominal CT scan with contrast recommended in 3 months.   HOSPITAL COURSE: The patient is a 80 year old Caucasian female with past medical history significant for history of stroke with right-sided weakness and aphasia, who presents to the hospital with drowsiness. Please refer  to Dr. Nichola Sizer  admission note on the 06/11/2014.  On arrival to the Emergency Room, the patient was afebrile with temperature of 98, pulse was 106, respiration rate was 18-24, blood pressure 140/92, saturation was 96% on 2 liters of oxygen per nasal cannula. Physical exam revealed a drowsy and confused lady who was going in and out of wakefulness. She did have weakness in the right side. Laboratory data done on arrival to the hospital showed a sodium level of 133, a BUN of 45, glucose of 270. Otherwise, BMP was unremarkable. The patient's liver enzymes revealed albumin level of 3.1, otherwise unremarkable. The patient's CK total was at 23, CK-MB fraction less than 0.5, and troponin was less than 0.02. White blood cell count was elevated at 38.5 thousand, hemoglobin was 11.0, and platelet count was 524,000. Blood cultures taken on 06/11/2014, did not show any growth. Urinalysis was unremarkable. ABGs were performed on 28% FiO2, showed pH of 7.43, pCO2 was 40, pO2 was 67, saturation was 93.0%. EKG showed sinus tachycardia at 107 beats per minute, possible anterior infarct, which was already cited in July 2009 and no acute ST or T changes were noted. Chest x-ray was concerning for possible pneumonia. The patient was admitted to the hospital for further evaluation. She was initiated on broad-spectrum antibiotics, Unasyn, because of concerns of aspiration pneumonitis. The patient was continued on oxygen therapy. Initially, on arrival to the hospital, she required BiPAP and noninvasive respiration and she was getting high-flow oxygen through nasal cannula. Later, as time progressed, she was weaned off high-flow on 06/15/2014 and was placed on 6 liters of oxygen through nasal cannula and slowly weaned down to 2 liters of oxygen through nasal cannula, as her condition overall improved. On the day of discharge, 06/18/2014, she was taken off Unasyn and initiated on Augmentin. The patient is to continue Augmentin for 5 more  days to complete course. She was seen by speech therapist who recommended a specialized diet. The patient's family was advised about feedings. The patient will be followed by speech therapist as an outpatient.   In regard to metabolic encephalopathy, the patient's metabolic encephalopathy resolved with therapy.   In regards to leukocytosis, the patient's white blood cell count was mildly elevated on arrival to the hospital. It improved blood cell count level of 14.2 on 06/29/2014. It is recommended to follow-up the patient's white blood cell count as outpatient to ensure its stability.   In regards to diabetes mellitus, the patient's diabetes was poorly controlled. She is to continue her insulin therapy. Hemoglobin A1c was not done during her stay in the hospital time.   In regards to hypertension, hyperlipidemia, stroke, anxiety, depression, she is to continue her outpatient medications, and she was counseled by palliative care, who felt the patient maybe has some element of depression, for which she was initiated on Remeron, and there is some element of anxiety, for which she was initiated on Ativan. She felt much more comfortable with these medications.   DISCHARGE VITAL SIGNS: On the day of discharge, temperature was 98.3, pulse was 73, respirations 18-20, blood pressure 143/81, saturation was 96% to 98% on 2 liters of oxygen through nasal cannula at rest.   DISPOSITION: The patient is being discharged to home with home health.   TIME SPENT: 40 minutes.    ____________________________ Theodoro Grist, MD rv:mw D: 06/18/2014 16:51:45 ET T: 06/18/2014 17:32:35 ET JOB#: [id number]  cc: Theodoro Grist, MD, <Dictator> Ellamae Sia, MD Tam [place] Ether Griffins MD ELECTRONICALLY SIGNED 06/23/2014 12:24

## 2014-09-05 NOTE — Discharge Summary (Signed)
PATIENT NAME:  Brenda Hogan, Brenda Hogan MR#:  [id number] DATE OF BIRTH:  06/22/1934  DATE OF ADMISSION:  05/31/2014 DATE OF DISCHARGE:    PRIMARY CARE PHYSICIAN:  Kingsley Spittle, MD.    FINAL DIAGNOSES:  1. Subacute cerebrovascular accident with right-sided paralysis.  2. Hypertension.  3. Uncontrolled type 2 diabetes.  4. Hyperlipidemia.   MEDICATIONS ON DISCHARGE: Include Tylenol 500 mg 1 tablet every 6 hours as needed for pain, lisinopril 5 mg daily, insulin detemir 30 unit subcutaneous injection daily, enoxaparin 40 mg subcutaneous injection daily, NovoLog injection sliding scale q.a.c. and at bedtime, Plavix 75 mg daily, albuterol 2.5 mg nebulizer every 6 hours while awake, Crestor 10 mg daily, levofloxacin 250 mg every 24 hours for 5 more days, then stop, Flovent CFC 110 mcg 1 puff twice a day, nystatin powder 1 application 3 times a day under skin folds.   DIET: Low sodium, carbohydrate-controlled diet, the patient is on mechanical soft diet.   ACTIVITY: As tolerated.   REFERRAL: To PT, OT, and speech, 1-2 days with doctor at rehabilitation.   HOSPITAL COURSE: The patient was admitted 05/31/2014, discharged to rehabilitation 06/02/2014. Came in with right-sided weakness going on for a few days. CT scan showed a subacute stroke. The patient refused further workup. The patient was thought to have a pneumonia, but chest x-ray was read as negative.   LABORATORY AND RADIOLOGICAL DATA DURING THE HOSPITAL COURSE: Included a CT scan of the head that showed new infarct to the left mid corona radiata and left basal ganglia, this may be subacute or possibly chronic in nature, correlate with patient's symptoms, mild to moderate cortical volume loss and scattered small vessel ischemic microangiopathy. Troponin negative. Glucose 224, BUN 28, creatinine 1.14, sodium 133, potassium 4.0, chloride 98, CO2 of 29, calcium 9.1. Liver function tests, albumin low at 3.3. INR 1.0. White count 20.2, H and H 11.0 and  34.6, platelet count of 402,000.  EKG, sinus tachycardia, premature atrial complexes. Chest x-ray read as negative. Urinalysis negative. TSH 3.09. Two troponins negative. LDL 212, HDL 38, triglycerides 330.  Hemoglobin A1c 12.3. Magnesium 2.1. Creatinine upon discharge 1.15. Echocardiogram showed EF 55%-60%.   HOSPITAL COURSE PER PROBLEM LIST:  1.  For the patient's subacute CVA with right-sided paralysis the patient refused MRI, carotid ultrasound. Not much else to do besides rehabilitation, physical therapy, and occupational therapy, and speech therapy. The patient is on a mechanical soft diet. I took a step up in treatment and use Plavix instead of aspirin and added Crestor. The patient does have a rash to Lipitor.  2.  Hypertension, essential. Blood pressure stable at 122/58.  3.  Uncontrolled diabetes. I actually am giving a lower dose of the Levemir than she is used to, could be noncompliance. Continue watching with NovoLog sliding scale and can titrate up the Levemir as needed if sugars are remaining a little high. Did have 1 sugar around 100, so that is why I am hesitant on going up.  4.  Hyperlipidemia. LDL greater than 200. Patient started on low-dose Crestor. Watch closely because the patient did have a rash with the Lipitor. Hopefully since this is a different agent we will be able to  tolerate the Crestor.  5.  The patient was seen in consultation by palliative care team. The patient is a Do Not Resuscitate.  6.  Overall prognosis is poor. I did convince the patient to go to rehabilitation because she would be a lot of work for the patient's family  if she were to go home.    TIME SPENT ON DISCHARGE TODAY: 40 minutes.     ____________________________ Tana Conch. Leslye Peer, MD rjw:bu D: 06/02/2014 14:28:53 ET T: 06/02/2014 15:04:18 ET JOB#: [id number]  cc: Tana Conch. Leslye Peer, MD, <Dictator> Ellamae Sia, MD Rehab facility  Marisue Brooklyn MD ELECTRONICALLY SIGNED 06/04/2014 14:43

## 2014-09-29 IMAGING — CR DG CHEST 1V PORT
1 series · 1 of 1 positions shown · non-contrast
Comparison: Portable exam 0233 hr compared to 05/06/2013

CLINICAL DATA: Congestion, shortness of breath, elevated
respiratory rate

EXAM:
PORTABLE CHEST - 1 VIEW

[AP]
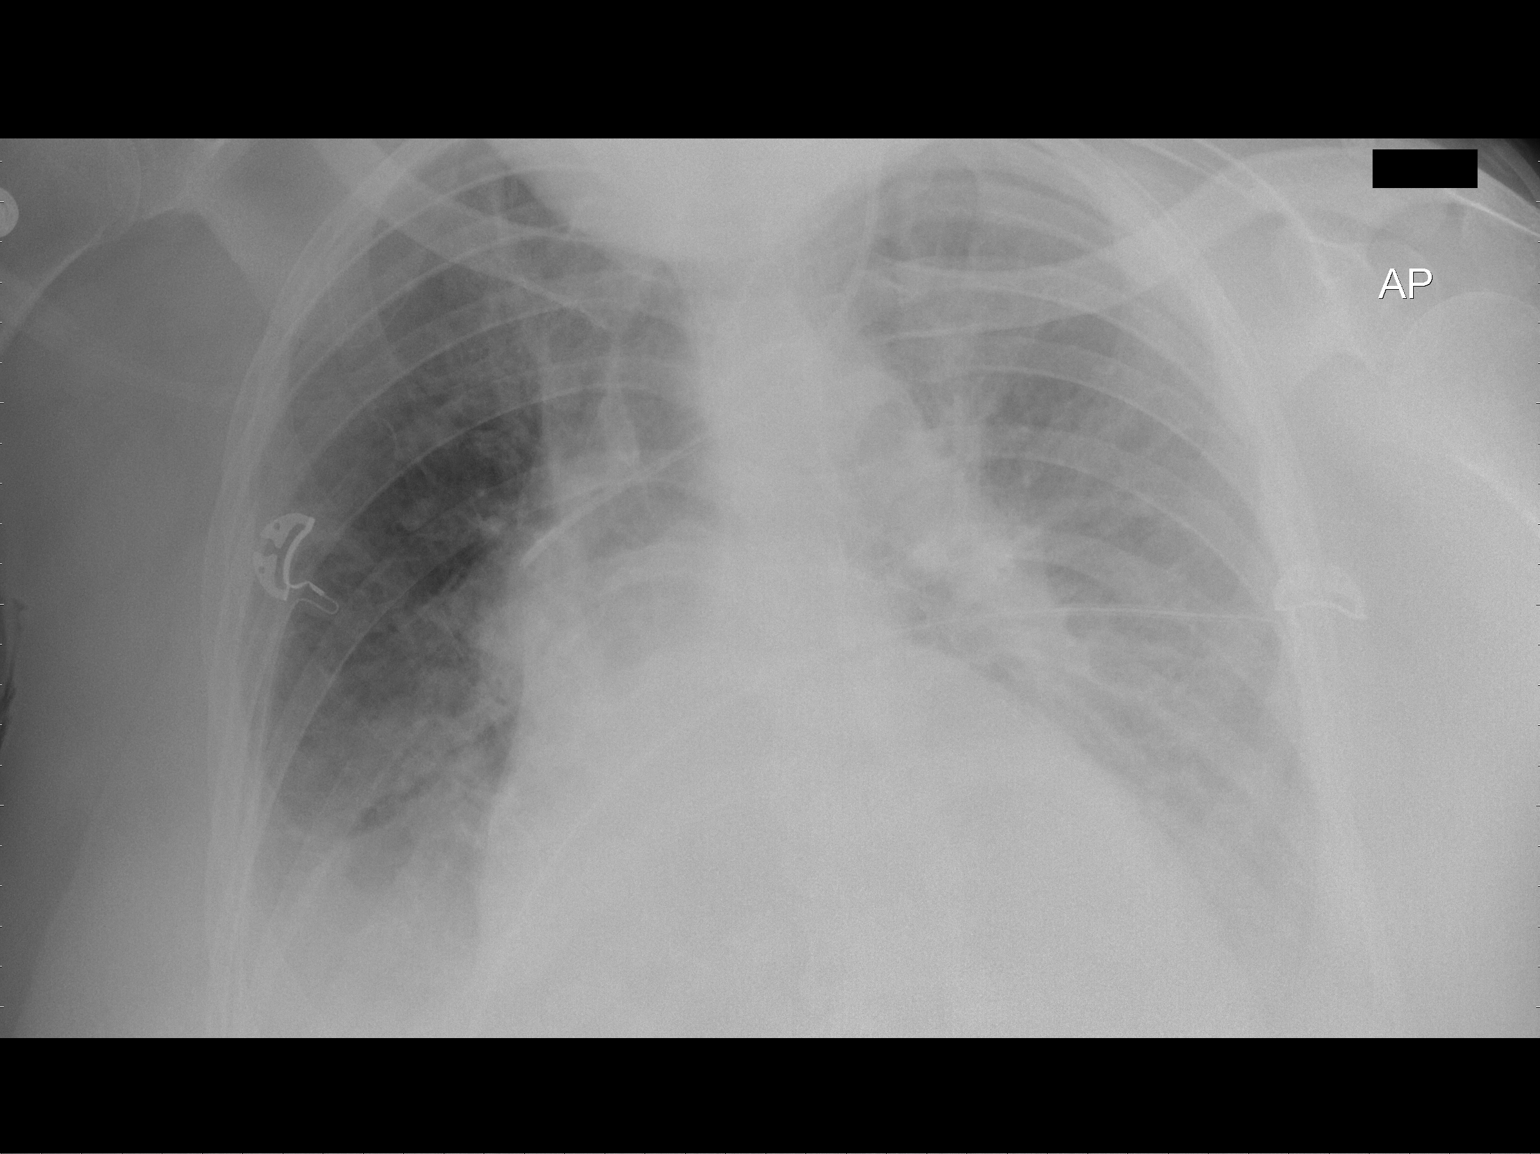

[1 of 1 positions shown; findings below may reference images not displayed]

FINDINGS: Interval removal of nasogastric and endotracheal tubes.

Tip of left jugular line projects over SVC at the level of the
azygos confluence.

Enlargement of cardiac silhouette with pulmonary vascular
congestion.

Probable pleural effusions bilaterally.

Persistent perihilar edema.

No pneumothorax or acute osseous findings.
IMPRESSION: Probable CHF with layered effusions and minimal basilar atelectasis.

## 2014-10-29 ENCOUNTER — Encounter: Payer: Self-pay | Admitting: Emergency Medicine

## 2014-10-29 ENCOUNTER — Emergency Department: Payer: Medicare Other

## 2014-10-29 ENCOUNTER — Inpatient Hospital Stay
Admission: EM | Admit: 2014-10-29 | Discharge: 2014-11-03 | DRG: 871 | Disposition: A | Discharge status: Home Health | Payer: Medicare Other | Attending: Internal Medicine | Admitting: Internal Medicine

## 2014-10-29 DIAGNOSIS — I2510 Atherosclerotic heart disease of native coronary artery without angina pectoris: Secondary | ICD-10-CM | POA: Diagnosis present

## 2014-10-29 DIAGNOSIS — R509 Fever, unspecified: Secondary | ICD-10-CM | POA: Diagnosis present

## 2014-10-29 DIAGNOSIS — I10 Essential (primary) hypertension: Secondary | ICD-10-CM | POA: Diagnosis present

## 2014-10-29 DIAGNOSIS — Z794 Long term (current) use of insulin: Secondary | ICD-10-CM

## 2014-10-29 DIAGNOSIS — Z9981 Dependence on supplemental oxygen: Secondary | ICD-10-CM

## 2014-10-29 DIAGNOSIS — R0989 Other specified symptoms and signs involving the circulatory and respiratory systems: Secondary | ICD-10-CM

## 2014-10-29 DIAGNOSIS — D72829 Elevated white blood cell count, unspecified: Secondary | ICD-10-CM

## 2014-10-29 DIAGNOSIS — J9621 Acute and chronic respiratory failure with hypoxia: Secondary | ICD-10-CM | POA: Diagnosis present

## 2014-10-29 DIAGNOSIS — I69351 Hemiplegia and hemiparesis following cerebral infarction affecting right dominant side: Secondary | ICD-10-CM

## 2014-10-29 DIAGNOSIS — Z96653 Presence of artificial knee joint, bilateral: Secondary | ICD-10-CM | POA: Diagnosis present

## 2014-10-29 DIAGNOSIS — R401 Stupor: Secondary | ICD-10-CM

## 2014-10-29 DIAGNOSIS — R531 Weakness: Secondary | ICD-10-CM

## 2014-10-29 DIAGNOSIS — I5040 Unspecified combined systolic (congestive) and diastolic (congestive) heart failure: Secondary | ICD-10-CM | POA: Diagnosis present

## 2014-10-29 DIAGNOSIS — Z79899 Other long term (current) drug therapy: Secondary | ICD-10-CM

## 2014-10-29 DIAGNOSIS — A419 Sepsis, unspecified organism: Principal | ICD-10-CM | POA: Diagnosis present

## 2014-10-29 DIAGNOSIS — Z7982 Long term (current) use of aspirin: Secondary | ICD-10-CM

## 2014-10-29 DIAGNOSIS — Z7902 Long term (current) use of antithrombotics/antiplatelets: Secondary | ICD-10-CM

## 2014-10-29 DIAGNOSIS — J189 Pneumonia, unspecified organism: Secondary | ICD-10-CM | POA: Diagnosis present

## 2014-10-29 DIAGNOSIS — D72823 Leukemoid reaction: Secondary | ICD-10-CM | POA: Diagnosis present

## 2014-10-29 DIAGNOSIS — E119 Type 2 diabetes mellitus without complications: Secondary | ICD-10-CM

## 2014-10-29 DIAGNOSIS — J441 Chronic obstructive pulmonary disease with (acute) exacerbation: Secondary | ICD-10-CM

## 2014-10-29 DIAGNOSIS — Z66 Do not resuscitate: Secondary | ICD-10-CM | POA: Diagnosis present

## 2014-10-29 DIAGNOSIS — R0902 Hypoxemia: Secondary | ICD-10-CM | POA: Diagnosis present

## 2014-10-29 DIAGNOSIS — C9590 Leukemia, unspecified not having achieved remission: Secondary | ICD-10-CM | POA: Diagnosis present

## 2014-10-29 DIAGNOSIS — M25559 Pain in unspecified hip: Secondary | ICD-10-CM

## 2014-10-29 DIAGNOSIS — R52 Pain, unspecified: Secondary | ICD-10-CM

## 2014-10-29 DIAGNOSIS — Z87891 Personal history of nicotine dependence: Secondary | ICD-10-CM

## 2014-10-29 DIAGNOSIS — E118 Type 2 diabetes mellitus with unspecified complications: Secondary | ICD-10-CM | POA: Diagnosis present

## 2014-10-29 DIAGNOSIS — Z8701 Personal history of pneumonia (recurrent): Secondary | ICD-10-CM

## 2014-10-29 DIAGNOSIS — Z8249 Family history of ischemic heart disease and other diseases of the circulatory system: Secondary | ICD-10-CM

## 2014-10-29 HISTORY — DX: Chronic obstructive pulmonary disease, unspecified: J44.9

## 2014-10-29 HISTORY — DX: Atherosclerotic heart disease of native coronary artery without angina pectoris: I25.10

## 2014-10-29 HISTORY — DX: Essential (primary) hypertension: I10

## 2014-10-29 HISTORY — DX: Type 1 diabetes mellitus without complications: E10.9

## 2014-10-29 HISTORY — DX: Cerebral infarction, unspecified: I63.9

## 2014-10-29 LAB — URINALYSIS COMPLETE WITH MICROSCOPIC (ARMC ONLY)
BILIRUBIN URINE: NEGATIVE
Glucose, UA: NEGATIVE mg/dL
Leukocytes, UA: NEGATIVE
Nitrite: NEGATIVE
PH: 5 (ref 5.0–8.0)
Protein, ur: 30 mg/dL — AB
SPECIFIC GRAVITY, URINE: 1.019 (ref 1.005–1.030)

## 2014-10-29 LAB — CBC
HCT: 34.3 % — ABNORMAL LOW (ref 35.0–47.0)
HEMOGLOBIN: 11.2 g/dL — AB (ref 12.0–16.0)
MCH: 26.5 pg (ref 26.0–34.0)
MCHC: 32.5 g/dL (ref 32.0–36.0)
MCV: 81.6 fL (ref 80.0–100.0)
Platelets: 460 10*3/uL — ABNORMAL HIGH (ref 150–440)
RBC: 4.21 MIL/uL (ref 3.80–5.20)
RDW: 15.1 % — ABNORMAL HIGH (ref 11.5–14.5)
WBC: 41.4 10*3/uL — AB (ref 3.6–11.0)

## 2014-10-29 LAB — BASIC METABOLIC PANEL
Anion gap: 11 (ref 5–15)
BUN: 17 mg/dL (ref 6–20)
CHLORIDE: 93 mmol/L — AB (ref 101–111)
CO2: 26 mmol/L (ref 22–32)
Calcium: 9.2 mg/dL (ref 8.9–10.3)
Creatinine, Ser: 0.7 mg/dL (ref 0.44–1.00)
GFR calc non Af Amer: >60 mL/min (ref >60)
Glucose, Bld: 198 mg/dL — ABNORMAL HIGH (ref 65–99)
POTASSIUM: 4.3 mmol/L (ref 3.5–5.1)
Sodium: 130 mmol/L — ABNORMAL LOW (ref 135–145)

## 2014-10-29 LAB — TROPONIN I

## 2014-10-29 MED ORDER — IPRATROPIUM-ALBUTEROL 0.5-2.5 (3) MG/3ML IN SOLN
RESPIRATORY_TRACT | Status: AC
  Filled 2014-10-29: qty 3

## 2014-10-29 MED ORDER — IPRATROPIUM-ALBUTEROL 0.5-2.5 (3) MG/3ML IN SOLN
3.0000 mL | Freq: Once | RESPIRATORY_TRACT | Status: AC
  Administered 2014-10-29: 3 mL via RESPIRATORY_TRACT

## 2014-10-29 NOTE — ED Notes (Signed)
Pt in and out cathed for urine sample. Pt readjusted in bed for comfort.

## 2014-10-29 NOTE — ED Notes (Signed)
Pt assisted with bedpan with return of large amount of semiformed brown stool.

## 2014-10-29 NOTE — ED Notes (Signed)
Per [facility], pt's family stated pt with "a brief period of altered mental status". Pt arrives to ed alert and oriented x3, pt wears oxygen 2lpm via [place] chronically. Pt denies pain, fsbs with ems 201. Per ems family states pt with pox of 78% on oxygen prior to ems arrival.

## 2014-10-29 NOTE — ED Notes (Signed)
md kaminiski notified of pt with wbc of 41.4. No new orders received.

## 2014-10-29 NOTE — ED Provider Notes (Signed)
[facility] Emergency Department Provider Note  ____________________________________________  Time seen: 2150 on arrival.  History is primarily from paramedics and communicated directly to me. The patient is alert and provides additional history, that it is not clear how reliable her report is.  I have reviewed the triage vital signs and the nursing notes.   HISTORY  Chief Complaint Altered Mental Status strong odor of urine. Low O2 sat times one reading   HPI Brenda Hogan is a 80 y.o. female who lives at home. She is cared for by her family. She has COPD and is on oxygen at home. This evening at 7:00 the family noted an altered mental status. While a specific duration is not available I am told by the paramedics that did not last long. It onset around 7:00 and resolved by the time the fire department first responders had arrived. The patient remembers the episode and says she felt okay during that event and she feels well now.  The patient's family has a pulse ox device at home. They did note one reading that was in the 70s. Subsequent readings were normal on the same device. Readings by first responders and the paramedics were normal in the 90s as well.  The family has noted that the patient has a strong odor of urine. They're concerned about a urinary tract infection and that this may have contributed to the brief period of altered mental status earlier this evening.   Past Medical History  Diagnosis Date  . Stroke   . COPD (chronic obstructive pulmonary disease)   . Coronary artery disease   . Hypertension       Patient Active Problem List   Diagnosis Date Noted  . Acute combined systolic and diastolic CHF, NYHA class 1 05/11/2013  . HTN (hypertension) 05/11/2013  . Diabetes mellitus, type II 05/11/2013  . Acute encephalopathy 05/11/2013  . Hypokalemia 05/11/2013  . Diabetes 05/10/2013  . COPD with exacerbation 05/09/2013  . Influenza A  05/04/2013  . NSTEMI (non-ST elevated myocardial infarction) 05/04/2013  . Acute respiratory failure 05/01/2013  . Sepsis 05/01/2013    History reviewed. No pertinent past surgical history.  Current Outpatient Rx  Name  Route  Sig  Dispense  Refill  . clopidogrel (PLAVIX) 75 MG tablet   Oral   Take 75 mg by mouth daily.      4   . insulin detemir (LEVEMIR) 100 UNIT/ML injection   Subcutaneous   Inject 0.3 mLs (30 Units total) into the skin at bedtime. 50-60 units depending on blood sugar   10 mL   0   . lisinopril (PRINIVIL,ZESTRIL) 2.5 MG tablet   Oral   Take 1 tablet (2.5 mg total) by mouth daily.   30 tablet   0   . metoprolol (LOPRESSOR) 50 MG tablet   Oral   Take 1 tablet (50 mg total) by mouth 2 (two) times daily.   62 tablet   0   . rosuvastatin (CRESTOR) 10 MG tablet   Oral   Take 10 mg by mouth daily.         Marland Kitchen albuterol (PROVENTIL HFA;VENTOLIN HFA) 108 (90 BASE) MCG/ACT inhaler   Inhalation   Inhale 2 puffs into the lungs every 6 (six) hours as needed for wheezing or shortness of breath. 2 puffs 3 times daily x 3 days then use as needed.   8.5 g   0   . ALPRAZolam (XANAX) 0.25 MG tablet   Oral  Take 1 tablet (0.25 mg total) by mouth 2 (two) times daily as needed for anxiety.   10 tablet   0   . aspirin 81 MG chewable tablet   Oral   Chew 1 tablet (81 mg total) by mouth daily.         Marland Kitchen atorvastatin (LIPITOR) 40 MG tablet   Oral   Take 1 tablet (40 mg total) by mouth daily at 6 PM.   31 tablet   0   . feeding supplement, GLUCERNA SHAKE, (GLUCERNA SHAKE) LIQD   Oral   Take 237 mLs by mouth 2 (two) times daily between meals.      0   . furosemide (LASIX) 40 MG tablet   Oral   Take 1 tablet (40 mg total) by mouth daily.   30 tablet   0   . insulin aspart (NOVOLOG) 100 UNIT/ML injection   Subcutaneous   Inject into the skin 3 (three) times daily before meals. Sliding scale         . ipratropium-albuterol (DUONEB) 0.5-2.5 (3)  MG/3ML SOLN   Nebulization   Take 3 mLs by nebulization 2 (two) times daily.   360 mL   0   . isosorbide-hydrALAZINE (BIDIL) 20-37.5 MG per tablet   Oral   Take 1 tablet by mouth 3 (three) times daily.   30 tablet   0   . potassium chloride SA (K-DUR,KLOR-CON) 20 MEQ tablet   Oral   Take 1 tablet (20 mEq total) by mouth daily.   31 tablet   0     Allergies Review of patient's allergies indicates no known allergies.  History reviewed. No pertinent family history.  Social History History  Substance Use Topics  . Smoking status: Former Research scientist (life sciences)  . Smokeless tobacco: Never Used  . Alcohol Use: No    Review of Systems Patient is alert and communicative. Review of systems seems overall reliable.  Constitutional: Negative for fever. ENT: Negative for sore throat. Cardiovascular: Negative for chest pain. Respiratory: History of COPD. Possible hypoxia earlier, resolved. See history of present illness Gastrointestinal: Negative for abdominal pain, vomiting and diarrhea. Genitourinary: Negative for dysuria. Notable smell of urine. Musculoskeletal: No myalgias or injuries. Skin: Negative for rash. Neurological: History of CVA in the past. Altered mental status tonight. See history of present illness   10-point ROS otherwise negative.  ____________________________________________   PHYSICAL EXAM:  VITAL SIGNS: ED Triage Vitals  Enc Vitals Group     BP --      Pulse --      Resp --      Temp --      Temp src --      SpO2 --      Weight --      Height --      Head Cir --      Peak Flow --      Pain Score --      Pain Loc --      Pain Edu? --      Excl. in [place]? --     Constitutional:  Alert, communicative, no distress. ENT   Head: Normocephalic and atraumatic.   Nose: No congestion/rhinnorhea.   Mouth/Throat: Mucous membranes are moist. Cardiovascular: Normal rate, regular rhythm, no murmur noted Respiratory:  Normal respiratory effort, no  tachypnea.    Mildly diminished breath sounds. No wheezing..  Gastrointestinal: Soft and nontender. No distention.  Back: No muscle spasm, no tenderness, no CVA tenderness. Musculoskeletal: No deformity  noted. Nontender with normal range of motion in all extremities.  No noted edema. Neurologic: No gross focal neurologic deficits are appreciated.  Skin:  Skin is warm, dry. No rash noted. Psychiatric: Mood and affect are normal. Communicative. Speech and behavior are normal.  ____________________________________________    LABS (pertinent positives/negatives)  CBC: White blood cell count is 41,000, hemoglobin 11.2, platelets 696 Metabolic panel: Sodium is low at 1:30, potassium is normal at 4.3, glucose 198 Troponin: Normal Urinalysis: Red blood cells 6-30, white blood cells none, squamous epithelial 6-30, ketones trace. ____________________________________________   EKG  ED ECG REPORT I, Chaney Ingram W, the attending physician, personally viewed and interpreted this ECG.   Date: 10/29/2014  EKG Time: 2203  Rate: 88  Rhythm: Normal sinus rhythm  Axis: Normal  Intervals: Normal  ST&T Change: None noted   ____________________________________________    RADIOLOGY  Chest x-ray:  IMPRESSION: 1. Stable cardiomegaly. 2. Small effusions are also suspected, stable in appearance.   ____________________________________________   INITIAL IMPRESSION / ASSESSMENT AND PLAN / ED COURSE  Pertinent labs & imaging results that were available during my care of the patient were reviewed by me and considered in my medical decision making (see chart for details).  Elderly somewhat frail patient in no acute distress. She is mentating and communicative. She has a strong odor of urine. We will obtain a urinalysis along with blood tests. We will obtain a chest x-ray due to the question of hypoxia earlier.  ----------------------------------------- 11:06 PM on  10/29/2014 -----------------------------------------  White blood cell count of 41,000. This is not new for the patient. She has had prior leukocytosis with levels of 38 and 40,000.  Sodium is slightly low at 130. Chest x-ray is stable. Urinalysis is still pending.  ----------------------------------------- 11:33 PM on 10/29/2014 -----------------------------------------  Reassessment of the patient finds her alert and communicative. She does appear somewhat frail. She would like to be able to go home this evening. There is no change in respiratory status. She overall appears to be breathing comfortably although with some noisy breath sounds. The chest x-ray showed no changes. I asked her if she would like a breathing treatment again and this time she agrees. Urinalysis is still pending.  ----------------------------------------- 11:46 PM on 10/29/2014 -----------------------------------------  Urinalysis is returned without sign of infection. Family is now in the room and able to give me more history. They deny any history of leukemia. They report she was told the patient had a higher white count previously due to her pneumonia. They're relieved that she does not have frank infiltrate on the chest x-ray. They reviewed the concerns of the hypoxia at home and her general alternate status and weakness.  I have spoken with Dr. Marcille Blanco of the hospitalist service for admission to the hospital due to the hypoxia at home, the suspicion for urinary tract infection, the altered mental status, and the profound leukocytosis with suspicion for leukemia.   ____________________________________________   FINAL CLINICAL IMPRESSION(S) / ED DIAGNOSES  Final diagnoses:  COPD exacerbation  Stupor  Leukocytosis  Weakness      Ahmed Prima, MD 10/30/14 0003

## 2014-10-30 ENCOUNTER — Observation Stay: Payer: Medicare Other

## 2014-10-30 ENCOUNTER — Encounter: Payer: Self-pay | Admitting: Internal Medicine

## 2014-10-30 DIAGNOSIS — R531 Weakness: Secondary | ICD-10-CM

## 2014-10-30 DIAGNOSIS — D72819 Decreased white blood cell count, unspecified: Secondary | ICD-10-CM | POA: Diagnosis not present

## 2014-10-30 DIAGNOSIS — R0602 Shortness of breath: Secondary | ICD-10-CM

## 2014-10-30 DIAGNOSIS — Z79899 Other long term (current) drug therapy: Secondary | ICD-10-CM

## 2014-10-30 DIAGNOSIS — I2510 Atherosclerotic heart disease of native coronary artery without angina pectoris: Secondary | ICD-10-CM

## 2014-10-30 DIAGNOSIS — J189 Pneumonia, unspecified organism: Secondary | ICD-10-CM

## 2014-10-30 DIAGNOSIS — Z87891 Personal history of nicotine dependence: Secondary | ICD-10-CM

## 2014-10-30 DIAGNOSIS — Z794 Long term (current) use of insulin: Secondary | ICD-10-CM

## 2014-10-30 DIAGNOSIS — Z7982 Long term (current) use of aspirin: Secondary | ICD-10-CM

## 2014-10-30 DIAGNOSIS — R0902 Hypoxemia: Secondary | ICD-10-CM | POA: Diagnosis present

## 2014-10-30 DIAGNOSIS — I10 Essential (primary) hypertension: Secondary | ICD-10-CM

## 2014-10-30 DIAGNOSIS — L8990 Pressure ulcer of unspecified site, unspecified stage: Secondary | ICD-10-CM

## 2014-10-30 DIAGNOSIS — E119 Type 2 diabetes mellitus without complications: Secondary | ICD-10-CM

## 2014-10-30 DIAGNOSIS — J449 Chronic obstructive pulmonary disease, unspecified: Secondary | ICD-10-CM

## 2014-10-30 LAB — LACTATE DEHYDROGENASE: LDH: 119 U/L (ref 98–192)

## 2014-10-30 LAB — CBC WITH DIFFERENTIAL/PLATELET
Basophils Absolute: 0.1 10*3/uL (ref 0–0.1)
Basophils Relative: 0 %
Eosinophils Absolute: 0 10*3/uL (ref 0–0.7)
Eosinophils Relative: 0 %
HCT: 31.7 % — ABNORMAL LOW (ref 35.0–47.0)
Hemoglobin: 9.9 g/dL — ABNORMAL LOW (ref 12.0–16.0)
Lymphocytes Relative: 6 %
Lymphs Abs: 2 10*3/uL (ref 1.0–3.6)
MCH: 25.6 pg — ABNORMAL LOW (ref 26.0–34.0)
MCHC: 31.1 g/dL — ABNORMAL LOW (ref 32.0–36.0)
MCV: 82.4 fL (ref 80.0–100.0)
Monocytes Absolute: 0.8 10*3/uL (ref 0.2–0.9)
Monocytes Relative: 3 %
Neutro Abs: 30.3 10*3/uL — ABNORMAL HIGH (ref 1.4–6.5)
Neutrophils Relative %: 91 %
Platelets: 421 10*3/uL (ref 150–440)
RBC: 3.85 MIL/uL (ref 3.80–5.20)
RDW: 15 % — ABNORMAL HIGH (ref 11.5–14.5)
WBC: 33.3 10*3/uL — ABNORMAL HIGH (ref 3.6–11.0)

## 2014-10-30 LAB — SEDIMENTATION RATE: Sed Rate: 96 mm/hr — ABNORMAL HIGH (ref 0–30)

## 2014-10-30 LAB — GLUCOSE, CAPILLARY
Glucose-Capillary: 196 mg/dL — ABNORMAL HIGH (ref 65–99)
Glucose-Capillary: 135 mg/dL — ABNORMAL HIGH (ref 65–99)
Glucose-Capillary: 117 mg/dL — ABNORMAL HIGH (ref 65–99)
Glucose-Capillary: 152 mg/dL — ABNORMAL HIGH (ref 65–99)

## 2014-10-30 LAB — C DIFFICILE QUICK SCREEN W PCR REFLEX
C DIFFICILE (CDIFF) TOXIN: NEGATIVE
C Diff antigen: NEGATIVE
C Diff interpretation: NEGATIVE

## 2014-10-30 LAB — URIC ACID: Uric Acid, Serum: 6.3 mg/dL (ref 2.3–6.6)

## 2014-10-30 LAB — HEMOGLOBIN A1C: Hgb A1c MFr Bld: 6.4 % — ABNORMAL HIGH (ref 4.0–6.0)

## 2014-10-30 MED ORDER — SODIUM CHLORIDE 0.9 % IV SOLN
INTRAVENOUS | Status: DC
  Administered 2014-10-30: 03:00:00 via INTRAVENOUS

## 2014-10-30 MED ORDER — IPRATROPIUM-ALBUTEROL 0.5-2.5 (3) MG/3ML IN SOLN
3.0000 mL | Freq: Two times a day (BID) | RESPIRATORY_TRACT | Status: DC
  Administered 2014-10-30 – 2014-11-03 (×9): 3 mL via RESPIRATORY_TRACT
  Filled 2014-10-30 (×9): qty 3

## 2014-10-30 MED ORDER — ISOSORB DINITRATE-HYDRALAZINE 20-37.5 MG PO TABS: 1.0000 | ORAL_TABLET | Freq: Three times a day (TID) | ORAL | Status: DC

## 2014-10-30 MED ORDER — IPRATROPIUM-ALBUTEROL 0.5-2.5 (3) MG/3ML IN SOLN
3.0000 mL | Freq: Two times a day (BID) | RESPIRATORY_TRACT | Status: DC
  Administered 2014-10-30: 3 mL via RESPIRATORY_TRACT
  Filled 2014-10-30: qty 3

## 2014-10-30 MED ORDER — INSULIN ASPART 100 UNIT/ML ~~LOC~~ SOLN
0.0000 [IU] | Freq: Three times a day (TID) | SUBCUTANEOUS | Status: DC
  Administered 2014-10-30 (×2): 3 [IU] via SUBCUTANEOUS
  Administered 2014-10-30 – 2014-10-31 (×2): 2 [IU] via SUBCUTANEOUS
  Administered 2014-10-31: 3 [IU] via SUBCUTANEOUS
  Administered 2014-11-01 – 2014-11-02 (×2): 8 [IU] via SUBCUTANEOUS
  Administered 2014-11-02: 3 [IU] via SUBCUTANEOUS
  Filled 2014-10-30: qty 3
  Filled 2014-10-30: qty 4
  Filled 2014-10-30: qty 7
  Filled 2014-10-30 (×2): qty 2
  Filled 2014-10-30: qty 3
  Filled 2014-10-30: qty 4

## 2014-10-30 MED ORDER — ONDANSETRON HCL 4 MG/2ML IJ SOLN
4.0000 mg | Freq: Four times a day (QID) | INTRAMUSCULAR | Status: DC | PRN
  Administered 2014-11-03: 4 mg via INTRAVENOUS
  Filled 2014-10-30: qty 2

## 2014-10-30 MED ORDER — HYDRALAZINE HCL 25 MG PO TABS: 37.5000 mg | ORAL_TABLET | Freq: Three times a day (TID) | ORAL | Status: DC

## 2014-10-30 MED ORDER — INSULIN DETEMIR 100 UNIT/ML ~~LOC~~ SOLN
20.0000 [IU] | Freq: Every day | SUBCUTANEOUS | Status: DC
  Administered 2014-10-30 – 2014-10-31 (×2): 20 [IU] via SUBCUTANEOUS
  Filled 2014-10-30 (×3): qty 0.2

## 2014-10-30 MED ORDER — ACETAMINOPHEN 650 MG RE SUPP: 650.0000 mg | Freq: Four times a day (QID) | RECTAL | Status: DC | PRN

## 2014-10-30 MED ORDER — ALPRAZOLAM 0.25 MG PO TABS: 0.2500 mg | ORAL_TABLET | Freq: Two times a day (BID) | ORAL | Status: DC | PRN

## 2014-10-30 MED ORDER — GLUCERNA SHAKE PO LIQD
237.0000 mL | Freq: Two times a day (BID) | ORAL | Status: DC
  Administered 2014-10-30 – 2014-11-01 (×5): 237 mL via ORAL

## 2014-10-30 MED ORDER — ALBUTEROL SULFATE (2.5 MG/3ML) 0.083% IN NEBU
2.5000 mg | INHALATION_SOLUTION | Freq: Four times a day (QID) | RESPIRATORY_TRACT | Status: DC | PRN
  Administered 2014-10-31 – 2014-11-02 (×2): 2.5 mg via RESPIRATORY_TRACT
  Filled 2014-10-30 (×2): qty 3

## 2014-10-30 MED ORDER — ONDANSETRON HCL 4 MG PO TABS: 4.0000 mg | ORAL_TABLET | Freq: Four times a day (QID) | ORAL | Status: DC | PRN

## 2014-10-30 MED ORDER — FUROSEMIDE 40 MG PO TABS
40.0000 mg | ORAL_TABLET | Freq: Every day | ORAL | Status: DC
  Administered 2014-10-30 – 2014-11-03 (×4): 40 mg via ORAL
  Filled 2014-10-30 (×5): qty 1

## 2014-10-30 MED ORDER — ROSUVASTATIN CALCIUM 10 MG PO TABS
10.0000 mg | ORAL_TABLET | Freq: Every day | ORAL | Status: DC
  Administered 2014-10-30 – 2014-11-03 (×5): 10 mg via ORAL
  Filled 2014-10-30 (×5): qty 1

## 2014-10-30 MED ORDER — ACETAMINOPHEN 325 MG PO TABS
650.0000 mg | ORAL_TABLET | Freq: Four times a day (QID) | ORAL | Status: DC | PRN
  Administered 2014-10-31 – 2014-11-02 (×5): 650 mg via ORAL
  Filled 2014-10-30 (×5): qty 2

## 2014-10-30 MED ORDER — LISINOPRIL 5 MG PO TABS
2.5000 mg | ORAL_TABLET | Freq: Every day | ORAL | Status: DC
  Administered 2014-10-30 – 2014-11-03 (×4): 2.5 mg via ORAL
  Filled 2014-10-30 (×5): qty 1

## 2014-10-30 MED ORDER — ASPIRIN 81 MG PO CHEW
81.0000 mg | CHEWABLE_TABLET | Freq: Every day | ORAL | Status: DC
  Administered 2014-10-30: 81 mg via ORAL
  Filled 2014-10-30 (×2): qty 1

## 2014-10-30 MED ORDER — SODIUM CHLORIDE 0.9 % IJ SOLN
3.0000 mL | Freq: Two times a day (BID) | INTRAMUSCULAR | Status: DC
Start: 2014-10-30 — End: 2014-11-03
  Administered 2014-10-30 – 2014-11-02 (×5): 3 mL via INTRAVENOUS

## 2014-10-30 MED ORDER — ISOSORBIDE DINITRATE 10 MG PO TABS
20.0000 mg | ORAL_TABLET | Freq: Three times a day (TID) | ORAL | Status: DC
  Administered 2014-10-30: 20 mg via ORAL
  Filled 2014-10-30 (×2): qty 2

## 2014-10-30 MED ORDER — HEPARIN SODIUM (PORCINE) 5000 UNIT/ML IJ SOLN
5000.0000 [IU] | Freq: Three times a day (TID) | INTRAMUSCULAR | Status: DC
  Administered 2014-10-30 – 2014-11-03 (×13): 5000 [IU] via SUBCUTANEOUS
  Filled 2014-10-30 (×13): qty 1

## 2014-10-30 MED ORDER — ALBUTEROL SULFATE HFA 108 (90 BASE) MCG/ACT IN AERS: 2.0000 | INHALATION_SPRAY | Freq: Four times a day (QID) | RESPIRATORY_TRACT | Status: DC | PRN

## 2014-10-30 MED ORDER — PNEUMOCOCCAL VAC POLYVALENT 25 MCG/0.5ML IJ INJ
0.5000 mL | INJECTION | INTRAMUSCULAR | Status: DC
  Filled 2014-10-30: qty 0.5

## 2014-10-30 MED ORDER — CLOPIDOGREL BISULFATE 75 MG PO TABS
75.0000 mg | ORAL_TABLET | Freq: Every day | ORAL | Status: DC
Start: 2014-10-30 — End: 2014-11-03
  Administered 2014-10-30 – 2014-11-03 (×4): 75 mg via ORAL
  Filled 2014-10-30 (×5): qty 1

## 2014-10-30 MED ORDER — METOPROLOL TARTRATE 50 MG PO TABS
50.0000 mg | ORAL_TABLET | Freq: Two times a day (BID) | ORAL | Status: DC
  Administered 2014-10-30 – 2014-11-03 (×7): 50 mg via ORAL
  Filled 2014-10-30 (×8): qty 1

## 2014-10-30 MED ORDER — POTASSIUM CHLORIDE CRYS ER 20 MEQ PO TBCR
20.0000 meq | EXTENDED_RELEASE_TABLET | Freq: Every day | ORAL | Status: DC
  Administered 2014-10-30 – 2014-11-03 (×5): 20 meq via ORAL
  Filled 2014-10-30 (×6): qty 1

## 2014-10-30 MED ORDER — DOCUSATE SODIUM 100 MG PO CAPS
100.0000 mg | ORAL_CAPSULE | Freq: Two times a day (BID) | ORAL | Status: DC
  Administered 2014-10-30 – 2014-11-03 (×6): 100 mg via ORAL
  Filled 2014-10-30 (×9): qty 1

## 2014-10-30 NOTE — Progress Notes (Signed)
Took over patient care at 3pm. Patient has been cooperative & w/o complaints. Continue to monitor.

## 2014-10-30 NOTE — H&P (Signed)
Lauraann Missey is an 80 y.o. female.   Chief Complaint: Weakness HPI: Patient presents to the emergency department on the request of her family due to some confusion earlier in the day that may have been related to hypoxia. Her home pulse oximeter read 77% but admittedly may not have been working correctly. Upon arrival to the emergency department the patient was initially confused or groggy but by the time of admission was clearheaded and without complaints. She has been bedridden since a stroke earlier this year but feeling marginally weaker for 1 week. Her daughter is concerned that she "may have some rattling in her chest". Notably the patient denies cough or shortness of breath. It is also important to note, however, that the patient smelled heavily of urine upon admission. Laboratory evaluation also revealed a white blood cell count greater than 40,000. (This is not a new finding). For the after mentioned reasons the emergency department called for admission to the hospitalist service.  Past Medical History  Diagnosis Date  . Stroke   . COPD (chronic obstructive pulmonary disease)   . Coronary artery disease   . Hypertension   . Diabetes mellitus type 1     Past Surgical History  Procedure Laterality Date  . Total knee arthroplasty Bilateral     Family History  Problem Relation Age of Onset  . CAD     Social History:  reports that she has quit smoking. She has never used smokeless tobacco. She reports that she does not drink alcohol or use illicit drugs.  Allergies: No Known Allergies  Prior to Admission medications   Medication Sig Start Date End Date Taking? Authorizing Provider  clopidogrel (PLAVIX) 75 MG tablet Take 75 mg by mouth daily. 10/15/14  Yes Historical Provider, MD  insulin detemir (LEVEMIR) 100 UNIT/ML injection Inject 0.3 mLs (30 Units total) into the skin at bedtime. 50-60 units depending on blood sugar 05/12/13  Yes Eugenie Filler, MD  lisinopril  (PRINIVIL,ZESTRIL) 2.5 MG tablet Take 1 tablet (2.5 mg total) by mouth daily. 05/12/13  Yes Eugenie Filler, MD  metoprolol (LOPRESSOR) 50 MG tablet Take 1 tablet (50 mg total) by mouth 2 (two) times daily. 05/12/13  Yes Eugenie Filler, MD  rosuvastatin (CRESTOR) 10 MG tablet Take 10 mg by mouth daily.   Yes Historical Provider, MD  albuterol (PROVENTIL HFA;VENTOLIN HFA) 108 (90 BASE) MCG/ACT inhaler Inhale 2 puffs into the lungs every 6 (six) hours as needed for wheezing or shortness of breath. 2 puffs 3 times daily x 3 days then use as needed. 05/12/13   Eugenie Filler, MD  ALPRAZolam Duanne Moron) 0.25 MG tablet Take 1 tablet (0.25 mg total) by mouth 2 (two) times daily as needed for anxiety. 05/12/13   Eugenie Filler, MD  aspirin 81 MG chewable tablet Chew 1 tablet (81 mg total) by mouth daily. 05/12/13   Eugenie Filler, MD  atorvastatin (LIPITOR) 40 MG tablet Take 1 tablet (40 mg total) by mouth daily at 6 PM. 05/12/13   Eugenie Filler, MD  feeding supplement, GLUCERNA SHAKE, (GLUCERNA SHAKE) LIQD Take 237 mLs by mouth 2 (two) times daily between meals. 05/12/13   Eugenie Filler, MD  furosemide (LASIX) 40 MG tablet Take 1 tablet (40 mg total) by mouth daily. 05/12/13   Eugenie Filler, MD  insulin aspart (NOVOLOG) 100 UNIT/ML injection Inject into the skin 3 (three) times daily before meals. Sliding scale    Historical Provider, MD  ipratropium-albuterol (DUONEB)  0.5-2.5 (3) MG/3ML SOLN Take 3 mLs by nebulization 2 (two) times daily. 05/12/13   Eugenie Filler, MD  isosorbide-hydrALAZINE (BIDIL) 20-37.5 MG per tablet Take 1 tablet by mouth 3 (three) times daily. 05/12/13   Eugenie Filler, MD  potassium chloride SA (K-DUR,KLOR-CON) 20 MEQ tablet Take 1 tablet (20 mEq total) by mouth daily. 05/12/13   Eugenie Filler, MD     Results for orders placed or performed during the hospital encounter of 10/29/14 (from the past 48 hour(s))  Basic metabolic panel     Status: Abnormal   Collection Time:  10/29/14 10:00 PM  Result Value Ref Range   Sodium 130 (L) 135 - 145 mmol/L   Potassium 4.3 3.5 - 5.1 mmol/L   Chloride 93 (L) 101 - 111 mmol/L   CO2 26 22 - 32 mmol/L   Glucose, Bld 198 (H) 65 - 99 mg/dL   BUN 17 6 - 20 mg/dL   Creatinine, Ser 0.70 0.44 - 1.00 mg/dL   Calcium 9.2 8.9 - 10.3 mg/dL   GFR calc non Af Amer >60 >60 mL/min   GFR calc Af Amer >60 >60 mL/min    Comment: (NOTE) The eGFR has been calculated using the CKD EPI equation. This calculation has not been validated in all clinical situations. eGFR's persistently <60 mL/min signify possible Chronic Kidney Disease.    Anion gap 11 5 - 15  CBC     Status: Abnormal   Collection Time: 10/29/14 10:00 PM  Result Value Ref Range   WBC 41.4 (H) 3.6 - 11.0 K/uL   RBC 4.21 3.80 - 5.20 MIL/uL   Hemoglobin 11.2 (L) 12.0 - 16.0 g/dL   HCT 34.3 (L) 35.0 - 47.0 %   MCV 81.6 80.0 - 100.0 fL   MCH 26.5 26.0 - 34.0 pg   MCHC 32.5 32.0 - 36.0 g/dL   RDW 15.1 (H) 11.5 - 14.5 %   Platelets 460 (H) 150 - 440 K/uL  Troponin I     Status: None   Collection Time: 10/29/14 10:00 PM  Result Value Ref Range   Troponin I <0.03 <0.031 ng/mL    Comment:        NO INDICATION OF MYOCARDIAL INJURY.   Urinalysis complete, with microscopic     Status: Abnormal   Collection Time: 10/29/14 11:10 PM  Result Value Ref Range   Color, Urine AMBER (A) YELLOW   APPearance HAZY (A) CLEAR   Glucose, UA NEGATIVE NEGATIVE mg/dL   Bilirubin Urine NEGATIVE NEGATIVE   Ketones, ur TRACE (A) NEGATIVE mg/dL   Specific Gravity, Urine 1.019 1.005 - 1.030   Hgb urine dipstick 1+ (A) NEGATIVE   pH 5.0 5.0 - 8.0   Protein, ur 30 (A) NEGATIVE mg/dL   Nitrite NEGATIVE NEGATIVE   Leukocytes, UA NEGATIVE NEGATIVE   RBC / HPF 6-30 0 - 5 RBC/hpf   WBC, UA 0-5 0 - 5 WBC/hpf   Bacteria, UA RARE (A) NONE SEEN   Squamous Epithelial / LPF 6-30 (A) NONE SEEN   Mucous PRESENT    Hyaline Casts, UA PRESENT    Dg Chest Port 1 View  10/29/2014   CLINICAL DATA:   Brief. Altered mental status today. Oxygen requirement.  EXAM: PORTABLE CHEST - 1 VIEW  COMPARISON:  06/14/2014  FINDINGS: The heart is enlarged but stable in appearance. Bilateral pleural effusions are suspected. There are no focal consolidations. No evidence for edema.  IMPRESSION: 1. Stable cardiomegaly. 2. Small effusions are also  suspected, stable in appearance.   Electronically Signed   By: Nolon Nations M.D.   On: 10/29/2014 22:17    Review of Systems  Constitutional: Negative for fever and chills.  HENT: Negative for sore throat and tinnitus.   Eyes: Negative for blurred vision and redness.  Respiratory: Negative for cough and shortness of breath.   Cardiovascular: Negative for chest pain, palpitations, orthopnea and PND.  Gastrointestinal: Negative for nausea, vomiting, abdominal pain and diarrhea.  Genitourinary: Negative for dysuria, urgency and frequency.  Musculoskeletal: Positive for joint pain (L hip). Negative for myalgias.  Skin: Negative for rash.       No lesions  Neurological: Positive for weakness. Negative for speech change and focal weakness.  Endo/Heme/Allergies: Does not bruise/bleed easily.       No temperature intolerance  Psychiatric/Behavioral: Negative for depression and suicidal ideas.    Blood pressure 107/94, pulse 86, temperature 97.8 F (36.6 C), temperature source Oral, resp. rate 15, weight 81.194 kg (179 lb), SpO2 100 %. Physical Exam  Vitals reviewed. Constitutional: She is oriented to person, place, and time. She appears well-developed and well-nourished. No distress.  HENT:  Head: Normocephalic and atraumatic.  Mouth/Throat: Oropharynx is clear and moist.  Eyes: Conjunctivae and EOM are normal. Pupils are equal, round, and reactive to light. No scleral icterus.  Neck: Normal range of motion. No JVD present. No tracheal deviation present. No thyromegaly present.  Cardiovascular: Normal rate, regular rhythm, normal heart sounds and intact distal  pulses.  Exam reveals no gallop and no friction rub.   No murmur heard. Respiratory: Effort normal and breath sounds normal. She has no wheezes. She has no rales.  GI: Soft. Bowel sounds are normal. She exhibits no distension. There is no tenderness.  Lymphadenopathy:    She has no cervical adenopathy.  Neurological: She is alert and oriented to person, place, and time. No cranial nerve deficit. She exhibits normal muscle tone.  4+ out of 5 strength right upper and lower extremities  Skin: Skin is warm and dry. No rash noted. No erythema.  Psychiatric: She has a normal mood and affect. Her behavior is normal. Judgment and thought content normal.     Assessment/Plan This is a 80 year old Caucasian female admitted for generalized weakness. 1. Weakness: Likely multifactorial. Largest continuing factor is likely deconditioning. The patient also has some residual right-sided weakness from her stroke. Also, the patient may have a malignant process that contributes to her general weakness. I will obtain a physical therapy and occupational therapy evaluation. The patient already receives physical therapy services at home but may need additional services as she has declined since previous assessment 2. Hypoxia: Resolved. The patient has underlying COPD and thus is within target range of 88-92% SpO2. The patient's lungs are clear and chest x-ray shows no evidence of infiltrate although there are small bilateral effusions that are likely chronic and insignificant in regard to her home oximetry readings. 3. COPD: Stable. Continue inhalers per home regimen 4. Leukocytosis: Hematology and oncology consult placed. The patient does not want a bone marrow biopsy. 5. Diabetes mellitus type 2: Sliding scale insulin while hospitalized in addition to reduced basal insulin dosing 6. Combined systolic and diastolic congestive heart failure: Stable; continue Lasix per home regimen 7. DVT prophylaxis: Heparin 8. GI  prophylaxis: None The patient is a DO NOT RESUSCITATE. Time spent on admission orders and patient care approximately 35 minutes  Harrie Foreman 10/30/2014, 1:38 AM

## 2014-10-30 NOTE — Evaluation (Signed)
Occupational Therapy Evaluation Patient Details Name: Brenda Hogan MRN: [id number] DOB: [birth year] Today's Date: 10/30/2014    History of Present Illness . Pt admitted due to weakness Chief Complaint: Weakness HPI: Patient presents to the emergency department on the request of her family due to some confusion earlier in the day that may have been related to hypoxia.      Clinical Impression  Pt received assistance with self care / ADLs prior to admission Pt demonstrates decrease endurance for ADL/Self care, decrease cognition ( oriented to person and place and able to follow simple directions, ) decrease independence in self care     Follow Up Recommendations  SNF;Supervision/Assistance - 24 hour    Equipment Recommendations    none at this time to further assess needs   Recommendations for Other Services PT consult  Precautions / Restrictions Precautions Precautions: Fall      Mobility Bed Mobility Overall bed mobility: Needs Assistance - need max assist to reposition in bed              Transfers                    Balance Overall balance assessment:  (not assessed)                                          ADL Overall ADL's : Needs assistance/impaired (dependent wit LB Self care , minimal assist  and verbal cues with grooming tasks in sitting ) Eating/Feeding: Set up;Minimal assistance;Supervision/ safety;Cueing for safety                                           Vision  Pt wears glass and reports blurry vision today   Perception Perception Perception Tested?: No       Pertinent Vitals/Pain Pain Assessment: No/denies painNo c/o pain during assessment     Hand Dominance  Right  Pt using left non dominate for ADLs   Extremity/Trunk Assessment Upper Extremity Assessment Upper Extremity Assessment: RUE deficits/detail RUE Deficits / Details: no AROM of R RUE Sensation: decreased light touch RUE  Coordination: decreased fine motor           Communication Communication Communication: No difficulties  Cognition Arousal/Alertness: Awake/alert Behavior During Therapy: Anxious Overall Cognitive Status:  (confusion)                    General Comments   Pt pleasant and cooperative. Pt found in bed and will to engage in evaluation Pt has very endurance and need verbal cues to engage in tasks.     Exercises  PROM of right UE and reposition of UE in bed       Home Living Living Arrangements:  (family) Available Help at Discharge: Personal care attendant (per Pt report)                       Home Equipment: Walker - 2 wheels;Shower seat          Prior Functioning/Environment Level of Independence: Needs assistance             OT Diagnosis: Generalized weakness;Cognitive deficits;Hemiplegia dominant side   OT Problem List: Decreased activity tolerance;Decreased cognition;Decreased strength;Decreased safety awareness (decrease Self care status)  OT Treatment/Interventions: Self-care/ADL training;Energy conservation;DME and/or AE instruction;Patient/family education;Therapeutic activities;Therapeutic exercise    OT Goals(Current goals can be found in the care plan section) Acute Rehab OT Goals Patient Stated Goal: to feel better OT Goal Formulation: Patient unable to participate in goal setting  OT Frequency: Min 1X/week   Barriers to D/C:            Co-evaluation              End of Session    Activity Tolerance: Patient limited by fatigue Patient left: in bed;with call bell/phone within reach  Time: 0845-0910 OT Time Calculation (min): 25 min Charges:  OT Evaluation $Initial OT Evaluation Tier I: 1 Procedure G-Codes: OT G-codes **NOT FOR INPATIENT CLASS** Functional Limitation: Self care Self Care Current Status (H6861): At least 80 percent but less than 100 percent impaired, limited or restricted Self Care Goal Status (U8372): At  least 80 percent but less than 100 percent impaired, limited or restricted  Brenda Hogan W 10/30/2014, 9:46 AM

## 2014-10-30 NOTE — ED Notes (Signed)
md in to reassess. 

## 2014-10-30 NOTE — Care Management Note (Signed)
Case Management Note  Patient Details  Name: Denyse Fillion MRN: [id number] Date of Birth: 05-17-1934  Subjective/Objective:      Medicare observation letter reviewed with patient, verbalized understanding,stated she could not sign due to" weakness", copy left in room, original placed on chart.              Action/Plan:   Expected Discharge Date:                  Expected Discharge Plan:     In-House Referral:     Discharge planning Services     Post Acute Care Choice:    Choice offered to:     DME Arranged:    DME Agency:     HH Arranged:    [place] Agency:     Status of Service:     Medicare Important Message Given:    Date Medicare IM Given:    Medicare IM give by:    Date Additional Medicare IM Given:    Additional Medicare Important Message give by:     If discussed at [place] of Stay Meetings, dates discussed:    Additional Comments:  Ival Bible, RN 10/30/2014, 10:58 AM

## 2014-10-30 NOTE — Progress Notes (Signed)
Patient cooperative with care.  Productive cough.  Multiple stools tested and negative for c-diff.  Fungal powder for yeast in groin and breast folds.  Cream for Stage 1 blanchable area on buttocks.  Patient needs to be turned every 2 hours.  Favors right side.

## 2014-10-30 NOTE — Care Management Note (Signed)
Case Management Note  Patient Details  Name: Brenda Hogan MRN: [id number] Date of Birth: [birth year]  Subjective/Objective:         Mrs Brenda Hogan is currently an active client of [facility] verified with Malachy Mood at [place].            Action/Plan:   Expected Discharge Date:                  Expected Discharge Plan:     In-House Referral:     Discharge planning Services     Post Acute Care Choice:    Choice offered to:     DME Arranged:    DME Agency:     HH Arranged:    [place] Agency:     Status of Service:     Medicare Important Message Given:    Date Medicare IM Given:    Medicare IM give by:    Date Additional Medicare IM Given:    Additional Medicare Important Message give by:     If discussed at [place] of Stay Meetings, dates discussed:    Additional Comments:  Salaam Battershell A, RN 10/30/2014, 1:34 PM

## 2014-10-30 NOTE — ED Notes (Signed)
Pt repositioned in bed multiple times for comfort. Admission process discussed with pt and family who verbalize understanding.

## 2014-10-30 NOTE — Progress Notes (Signed)
[facility]  Date of admission:  10/30/2014  Inpatient day:  10/30/2014   Reason for consultation:  Significant leukocytosis, no infection, likely leukemia.  Consulting physician: Rosilyn Mings, MD   Chief Complaint: Brenda Hogan is a 80 y.o. female admitted through the emergency room with generalized weakness.  HPI:  The patient notes a history of leukocytosis for the past several months.  She was admitted on 05/31/2014 with a subacute cerebrovascular accident with right-sided paralysis.  Labs on admission included a hematocrit 34.6, hemoglobin 11, platelets 402,000 and white count 20,200.  At that time, the patient refused MRI and carotid ultrasound. She was treated with Plavix.  At the time of discharge, white count was 23,300. She was discharged to rehabilitation.  The patient was readmitted to the hospital from 06/11/2014 to 06/18/2014 with aspiration pneumonia. Labs on admission included a white count of 38,500.  Hemoglobin was 11 with platelets 524,000.  Peak WBC was 40,800 with 86% neutrophils.  WBC on 06/14/2014 was 23,300.  She was treated with antibiotics and oxygen. She was discharged on Augmentin.  On 06/29/2014 white count was 14,200 (per discharge summary).  She was brought to the emergency room with shortness of breath, congestion and cough. She denies any fever at home. Shenotes a decubitus ulcer for a few weeks. She denies any urinary symptoms. Her weight has been stable.  She notes that she is completely reliant on her grandson for care.  She is confined to her bed or chair. She states that she will get up from bed into a wheelchair then will sit in another room and watch television during the day.  She can not perform any activities of daily living.  Past Medical History  Diagnosis Date  . Stroke   . COPD (chronic obstructive pulmonary disease)   . Coronary artery disease   . Hypertension   . Diabetes mellitus type 1     Past Surgical  History  Procedure Laterality Date  . Total knee arthroplasty Bilateral     Family History  Problem Relation Age of Onset  . CAD      Social History:  reports that she has quit smoking. She has never used smokeless tobacco. She reports that she does not drink alcohol or use illicit drugs.  The patient is alobe today.  Allergies: No Known Allergies  Medications Prior to Admission  Medication Sig Dispense Refill  . clopidogrel (PLAVIX) 75 MG tablet Take 75 mg by mouth daily.  4  . insulin detemir (LEVEMIR) 100 UNIT/ML injection Inject 0.3 mLs (30 Units total) into the skin at bedtime. 50-60 units depending on blood sugar 10 mL 0  . lisinopril (PRINIVIL,ZESTRIL) 2.5 MG tablet Take 1 tablet (2.5 mg total) by mouth daily. 30 tablet 0  . metoprolol (LOPRESSOR) 50 MG tablet Take 1 tablet (50 mg total) by mouth 2 (two) times daily. 62 tablet 0  . rosuvastatin (CRESTOR) 10 MG tablet Take 10 mg by mouth daily.    Marland Kitchen albuterol (PROVENTIL HFA;VENTOLIN HFA) 108 (90 BASE) MCG/ACT inhaler Inhale 2 puffs into the lungs every 6 (six) hours as needed for wheezing or shortness of breath. 2 puffs 3 times daily x 3 days then use as needed. 8.5 g 0  . ALPRAZolam (XANAX) 0.25 MG tablet Take 1 tablet (0.25 mg total) by mouth 2 (two) times daily as needed for anxiety. 10 tablet 0  . aspirin 81 MG chewable tablet Chew 1 tablet (81 mg total) by mouth daily.    Marland Kitchen  atorvastatin (LIPITOR) 40 MG tablet Take 1 tablet (40 mg total) by mouth daily at 6 PM. 31 tablet 0  . feeding supplement, GLUCERNA SHAKE, (GLUCERNA SHAKE) LIQD Take 237 mLs by mouth 2 (two) times daily between meals.  0  . furosemide (LASIX) 40 MG tablet Take 1 tablet (40 mg total) by mouth daily. 30 tablet 0  . insulin aspart (NOVOLOG) 100 UNIT/ML injection Inject into the skin 3 (three) times daily before meals. Sliding scale    . ipratropium-albuterol (DUONEB) 0.5-2.5 (3) MG/3ML SOLN Take 3 mLs by nebulization 2 (two) times daily. 360 mL 0  .  isosorbide-hydrALAZINE (BIDIL) 20-37.5 MG per tablet Take 1 tablet by mouth 3 (three) times daily. 30 tablet 0  . potassium chloride SA (K-DUR,KLOR-CON) 20 MEQ tablet Take 1 tablet (20 mEq total) by mouth daily. 31 tablet 0    Review of Systems: GENERAL:  Fatigue and general weakness since stroke.  No fevers, sweats or weight loss. PERFORMANCE STATUS (ECOG):  4 HEENT:  No visual changes, runny nose, sore throat, mouth sores or tenderness. Lungs: Cough and congestion.  No shortness of breath.  No hemoptysis. Cardiac:  No chest pain, palpitations, orthopnea, or PND. GI:  Typical bowel movement every 7 days.  No nausea, vomiting, diarrhea, constipation, melena or hematochezia. GU:  No urgency, frequency, dysuria, or hematuria. Musculoskeletal:  No back pain.  No joint pain.  No muscle tenderness. Extremities:  No pain or swelling. Skin:  Decubitus ulcer.  No rashes or skin changes. Neuro:  Right sided hemiparesis.  No headache. Endocrine:  Diabetes.  No thyroid issues, hot flashes or night sweats. Psych:  No mood changes, depression or anxiety. Pain:  No focal pain. Review of systems:  All other systems reviewed and found to be negative.   Physical Exam:  Blood pressure 113/41, pulse 72, temperature 98.7 F (37.1 C), temperature source Oral, resp. rate 18, height _0  (1.549 m), weight 179 lb (81.194 kg), SpO2 97 %.  GENERAL:  Chronically ill appearing woman lying comfortably on the medical unit in no acute distress. MENTAL STATUS:  Alert and oriented to person, place and time. HEAD:  [name] hair.  Normocephalic, atraumatic, face symmetric, no Cushingoid features. EYES:  Pupils equal round and reactive to light and accomodation.  No conjunctivitis or scleral icterus. ENT:  Edentulous.  Oropharynx clear without lesion.  Tongue normal. Mucous membranes moist.  RESPIRATORY:  Clear to auscultation without rales, wheezes or rhonchi. CARDIOVASCULAR:  Regular rate and rhythm without murmur, rub or  gallop. ABDOMEN:  Soft, non-tender, with active bowel sounds, and no hepatosplenomegaly.  No masses.  Sitting in stool. SKIN:  No skin breakdown associated with decubitus ulcer.  No rashes, ulcers or lesions. EXTREMITIES: No edema, no skin discoloration or tenderness.  No palpable cords. LYMPH NODES: No palpable cervical, supraclavicular, axillary or inguinal adenopathy  NEUROLOGICAL: Right arm and hand flexed s/p CVA. PSYCH:  Appropriate.  Results for orders placed or performed during the hospital encounter of 10/29/14 (from the past 48 hour(s))  Basic metabolic panel     Status: Abnormal   Collection Time: 10/29/14 10:00 PM  Result Value Ref Range   Sodium 130 (L) 135 - 145 mmol/L   Potassium 4.3 3.5 - 5.1 mmol/L   Chloride 93 (L) 101 - 111 mmol/L   CO2 26 22 - 32 mmol/L   Glucose, Bld 198 (H) 65 - 99 mg/dL   BUN 17 6 - 20 mg/dL   Creatinine, Ser 0.70 0.44 - 1.00  mg/dL   Calcium 9.2 8.9 - 10.3 mg/dL   GFR calc non Af Amer >60 >60 mL/min   GFR calc Af Amer >60 >60 mL/min    Comment: (NOTE) The eGFR has been calculated using the CKD EPI equation. This calculation has not been validated in all clinical situations. eGFR's persistently <60 mL/min signify possible Chronic Kidney Disease.    Anion gap 11 5 - 15  CBC     Status: Abnormal   Collection Time: 10/29/14 10:00 PM  Result Value Ref Range   WBC 41.4 (H) 3.6 - 11.0 K/uL   RBC 4.21 3.80 - 5.20 MIL/uL   Hemoglobin 11.2 (L) 12.0 - 16.0 g/dL   HCT 34.3 (L) 35.0 - 47.0 %   MCV 81.6 80.0 - 100.0 fL   MCH 26.5 26.0 - 34.0 pg   MCHC 32.5 32.0 - 36.0 g/dL   RDW 15.1 (H) 11.5 - 14.5 %   Platelets 460 (H) 150 - 440 K/uL  Troponin I     Status: None   Collection Time: 10/29/14 10:00 PM  Result Value Ref Range   Troponin I <0.03 <0.031 ng/mL    Comment:        NO INDICATION OF MYOCARDIAL INJURY.   Urinalysis complete, with microscopic     Status: Abnormal   Collection Time: 10/29/14 11:10 PM  Result Value Ref Range   Color,  Urine AMBER (A) YELLOW   APPearance HAZY (A) CLEAR   Glucose, UA NEGATIVE NEGATIVE mg/dL   Bilirubin Urine NEGATIVE NEGATIVE   Ketones, ur TRACE (A) NEGATIVE mg/dL   Specific Gravity, Urine 1.019 1.005 - 1.030   Hgb urine dipstick 1+ (A) NEGATIVE   pH 5.0 5.0 - 8.0   Protein, ur 30 (A) NEGATIVE mg/dL   Nitrite NEGATIVE NEGATIVE   Leukocytes, UA NEGATIVE NEGATIVE   RBC / HPF 6-30 0 - 5 RBC/hpf   WBC, UA 0-5 0 - 5 WBC/hpf   Bacteria, UA RARE (A) NONE SEEN   Squamous Epithelial / LPF 6-30 (A) NONE SEEN   Mucous PRESENT    Hyaline Casts, UA PRESENT   Glucose, capillary     Status: Abnormal   Collection Time: 10/30/14  7:43 AM  Result Value Ref Range   Glucose-Capillary 196 (H) 65 - 99 mg/dL   Comment 1 Notify RN   CBC with Differential     Status: Abnormal   Collection Time: 10/30/14 10:50 AM  Result Value Ref Range   WBC 33.3 (H) 3.6 - 11.0 K/uL   RBC 3.85 3.80 - 5.20 MIL/uL   Hemoglobin 9.9 (L) 12.0 - 16.0 g/dL   HCT 31.7 (L) 35.0 - 47.0 %   MCV 82.4 80.0 - 100.0 fL   MCH 25.6 (L) 26.0 - 34.0 pg   MCHC 31.1 (L) 32.0 - 36.0 g/dL   RDW 15.0 (H) 11.5 - 14.5 %   Platelets 421 150 - 440 K/uL   Neutrophils Relative % 91 %   Neutro Abs 30.3 (H) 1.4 - 6.5 K/uL   Lymphocytes Relative 6 %   Lymphs Abs 2.0 1.0 - 3.6 K/uL   Monocytes Relative 3 %   Monocytes Absolute 0.8 0.2 - 0.9 K/uL   Eosinophils Relative 0 %   Eosinophils Absolute 0.0 0 - 0.7 K/uL   Basophils Relative 0 %   Basophils Absolute 0.1 0 - 0.1 K/uL  Sedimentation rate     Status: Abnormal   Collection Time: 10/30/14 10:50 AM  Result Value Ref Range  Sed Rate 96 (H) 0 - 30 mm/hr  Lactate dehydrogenase     Status: None   Collection Time: 10/30/14 10:50 AM  Result Value Ref Range   LDH 119 98 - 192 U/L  Uric acid     Status: None   Collection Time: 10/30/14 10:50 AM  Result Value Ref Range   Uric Acid, Serum 6.3 2.3 - 6.6 mg/dL  Glucose, capillary     Status: Abnormal   Collection Time: 10/30/14 11:27 AM   Result Value Ref Range   Glucose-Capillary 135 (H) 65 - 99 mg/dL   Comment 1 Notify RN   C difficile quick scan w PCR reflex ([facility] only)     Status: None   Collection Time: 10/30/14 11:46 AM  Result Value Ref Range   C Diff antigen NEGATIVE    C Diff toxin NEGATIVE    C Diff interpretation Negative for C. difficile    Dg Chest Port 1 View  10/29/2014   CLINICAL DATA:  Brief. Altered mental status today. Oxygen requirement.  EXAM: PORTABLE CHEST - 1 VIEW  COMPARISON:  06/14/2014  FINDINGS: The heart is enlarged but stable in appearance. Bilateral pleural effusions are suspected. There are no focal consolidations. No evidence for edema.  IMPRESSION: 1. Stable cardiomegaly. 2. Small effusions are also suspected, stable in appearance.   Electronically Signed   By: Nolon Nations M.D.   On: 10/29/2014 22:17   Dg Hip Unilat With Pelvis 2-3 Views Left  10/30/2014   CLINICAL DATA:  Generalize left hip pain for several days.  EXAM: LEFT HIP (WITH PELVIS) 2-3 VIEWS  COMPARISON:  None.  FINDINGS: The bones appear diffusely osteopenic. Mild degenerative changes are noted involving both hips. There is no acute fracture or subluxation identified.  IMPRESSION: 1. Osteopenia and mild degenerative change. 2. No displaced fracture noted. If there is high clinical suspicion for occult fracture or the patient refuses to weightbear, consider further evaluation with MRI. Although CT is expeditious, evidence is lacking regarding accuracy of CT over plain film radiography.   Electronically Signed   By: Kerby Moors M.D.   On: 10/30/2014 10:38    Assessment:  The patient is a 80 y.o. woman status post CVA in 06/2014.  She is completely disabled.  Course has been complicated by pneumonia.  With admissions, her WBC is elevated, but declines with treatment.  Differential is predominantly neutrophils.  She appears to have a leukamoid reaction.  Differntial diagnosis includes CML (doubt).  Exam reveals no adenopathy or  hepatosplenomegaly.  Plan:   1)  Labs today:  CBC with diff, LDH, uric acid, ESR, and CRP. 2)  If peripheral smear reveals any concerning cells, will send for flow cytometry or BCR/ABL (doubt).  Anticipate WBC will trend down as acute issue necessitating admission resolves. 3)  Monitor for evidence of infection.   Thank you for allowing me to participate in Brenda Hogan 's care.  I will follow her closely with you while hospitalized.  Lequita Asal, MD  10/30/2014, 1:55 PM

## 2014-10-30 NOTE — Evaluation (Signed)
Physical Therapy Evaluation Patient Details Name: Brenda Hogan MRN: [id number] DOB: Jan 03, 1935 Today's Date: 10/30/2014   History of Present Illness  Pt here with hypoxia  Clinical Impression  Pt here with hypoxia, her o2 is low 90s on 3 liters at rest.  She is uninterested in trying to sit up today stating she is "too tired" and "scared" to try sitting up (which she says she has been practicing with Posey).  She becomes hyper-responsive and fearful of falling with minimal LE AA/PROM exercises and needs much encouragement to even tolerate light supine exercises.    Follow Up Recommendations Home health PT (per pt willingness to participate)    Equipment Recommendations       Recommendations for Other Services       Precautions / Restrictions Precautions Precautions: Fall Restrictions Weight Bearing Restrictions: No      Mobility  Bed Mobility Overal bed mobility:  (pt reports being too fearful of falling, refuses)                Transfers                    Ambulation/Gait                Stairs            Wheelchair Mobility    Modified Rankin (Stroke Patients Only)       Balance Overall balance assessment:  (not assessed)                                           Pertinent Vitals/Pain Pain Assessment: No/denies pain    Home Living Family/patient expects to be discharged to:: Private residence Living Arrangements: Other relatives Available Help at Discharge: Personal care attendant (per Pt report) Type of Home: House Home Access: Stairs to enter (states family pulls her in w/c up the steps to get in/out )     Home Layout: One level Home Equipment: Hospital bed      Prior Function Level of Independence: Needs assistance   Gait / Transfers Assistance Needed: dependent assist, reports she spends most days in her bed all day (family changes pads under her when she deficates, etc)           Hand  Dominance        Extremity/Trunk Assessment   Upper Extremity Assessment: Defer to OT evaluation RUE Deficits / Details: no AROM of R   RUE Sensation: decreased light touch     Lower Extremity Assessment: Generalized weakness (R knee lacks ~15 deg ext, overall minimal AROM/strength)         Communication   Communication: No difficulties  Cognition Arousal/Alertness: Awake/alert Behavior During Therapy: Anxious;Restless Overall Cognitive Status: Within Functional Limits for tasks assessed                      General Comments      Exercises General Exercises - Upper Extremity Shoulder Flexion:  (PROM of right UE and proper positioning of UE in bed ) General Exercises - Lower Extremity Ankle Circles/Pumps: AROM;10 reps Heel Slides: AAROM;10 reps Hip ABduction/ADduction: AROM;10 reps;AAROM      Assessment/Plan    PT Assessment  (3 day trial)  PT Diagnosis Generalized weakness   PT Problem List    PT Treatment Interventions     PT Goals (Current goals  can be found in the Care Plan section) Acute Rehab PT Goals Patient Stated Goal: "I just want to go back home" PT Goal Formulation: With patient Time For Goal Achievement: 10/30/14 Potential to Achieve Goals: Fair    Frequency     Barriers to discharge        Co-evaluation               End of Session   Activity Tolerance:  (pt limited by fear) Patient left: with bed alarm set           Time: 1125-1140 PT Time Calculation (min) (ACUTE ONLY): 15 min   Charges:   PT Evaluation $Initial PT Evaluation Tier I: 1 Procedure     PT G Codes:       Wayne Both, PT, DPT [phone]  Kreg Shropshire 10/30/2014, 1:00 PM

## 2014-10-30 NOTE — ED Notes (Signed)
hospitalist in to see pt.

## 2014-10-30 NOTE — Progress Notes (Signed)
[facility] [place] NAME: Brenda Hogan    MR#:  [id number]  DATE OF BIRTH:  1935-02-24  SUBJECTIVE:  Came in after family found to be confused. Mentation back to baseline. Pt has no complaints today. Bed bound at home  REVIEW OF SYSTEMS:   Review of Systems  Constitutional: Negative for fever, chills and weight loss.  HENT: Negative for ear discharge, ear pain and nosebleeds.   Eyes: Negative for blurred vision, pain and discharge.  Respiratory: Negative for sputum production, shortness of breath, wheezing and stridor.   Cardiovascular: Negative for chest pain, palpitations, orthopnea and PND.  Gastrointestinal: Negative for nausea, vomiting, abdominal pain and diarrhea.  Genitourinary: Negative for urgency and frequency.  Musculoskeletal: Positive for back pain. Negative for joint pain.  Neurological: Positive for weakness. Negative for sensory change, speech change and focal weakness.  Psychiatric/Behavioral: Negative for depression. The patient is not nervous/anxious.    Tolerating Diet:yes Tolerating PT: bedbound  DRUG ALLERGIES:  No Known Allergies  VITALS:  Blood pressure 124/52, pulse 79, temperature 98.1 F (36.7 C), temperature source Oral, resp. rate 18, height 5' 1" (1.549 m), weight 81.194 kg (179 lb), SpO2 94 %.  PHYSICAL EXAMINATION:   Physical Exam  GENERAL:  80 y.o.-year-old patient lying in the bed with no acute distress.  EYES: Pupils equal, round, reactive to light and accommodation. No scleral icterus. Extraocular muscles intact.  HEENT: Head atraumatic, normocephalic. Oropharynx and nasopharynx clear.  NECK:  Supple, no jugular venous distention. No thyroid enlargement, no tenderness.  LUNGS: Normal breath sounds bilaterally, no wheezing, rales, rhonchi. No use of accessory muscles of respiration.  CARDIOVASCULAR: S1, S2 normal. No murmurs, rubs, or gallops.  ABDOMEN: Soft, nontender, nondistended.  Bowel sounds present. No organomegaly or mass.  EXTREMITIES: No cyanosis, clubbing or edema b/l.    NEUROLOGIC: Cranial nerves II through XII are intact. No focal Motor or sensory deficits b/l.   PSYCHIATRIC: The patient is alert and oriented x 3.  SKIN: pressure ulcer, chronic legs  LABORATORY PANEL:   CBC  Recent Labs Lab 10/30/14 1050  WBC 33.3*  HGB 9.9*  HCT 31.7*  PLT 421    Chemistries   Recent Labs Lab 10/29/14 2200  NA 130*  K 4.3  CL 93*  CO2 26  GLUCOSE 198*  BUN 17  CREATININE 0.70  CALCIUM 9.2    Cardiac Enzymes  Recent Labs Lab 10/29/14 2200  TROPONINI <0.03    RADIOLOGY:  Dg Chest Port 1 View  10/29/2014   CLINICAL DATA:  Brief. Altered mental status today. Oxygen requirement.  EXAM: PORTABLE CHEST - 1 VIEW  COMPARISON:  06/14/2014  FINDINGS: The heart is enlarged but stable in appearance. Bilateral pleural effusions are suspected. There are no focal consolidations. No evidence for edema.  IMPRESSION: 1. Stable cardiomegaly. 2. Small effusions are also suspected, stable in appearance.   Electronically Signed   By: Nolon Nations M.D.   On: 10/29/2014 22:17   Dg Hip Unilat With Pelvis 2-3 Views Left  10/30/2014   CLINICAL DATA:  Generalize left hip pain for several days.  EXAM: LEFT HIP (WITH PELVIS) 2-3 VIEWS  COMPARISON:  None.  FINDINGS: The bones appear diffusely osteopenic. Mild degenerative changes are noted involving both hips. There is no acute fracture or subluxation identified.  IMPRESSION: 1. Osteopenia and mild degenerative change. 2. No displaced fracture noted. If there is high clinical suspicion for occult fracture or the patient refuses to  weightbear, consider further evaluation with MRI. Although CT is expeditious, evidence is lacking regarding accuracy of CT over plain film radiography.   Electronically Signed   By: Kerby Moors M.D.   On: 10/30/2014 10:38    ASSESSMENT AND PLAN:   80 year old Caucasian female admitted for  generalized weakness. 1.Altered mental status -resolved, unclear etio 2. Hypoxia: Resolved. The patient has underlying COPD and thus is within target range of 88-92% SpO2. The patient's lungs are clear and chest x-ray shows no evidence of infiltrate although there are small bilateral effusions that are likely chronic and insignificant in regard to her home oximetry readings. -uses chronic home oxygen 3liter/min sats 96-97% 3. COPD: Stable. Continue inhalers per home regimen 4. Leukocytosis: Hematology and oncology consult placed. The patient does not want a bone marrow biopsy. 5. Diabetes mellitus type 2: Sliding scale insulin while hospitalized in addition to reduced basal insulin dosing 6. Combined systolic and diastolic congestive heart failure: Stable; continue Lasix per home regimen 7. DVT prophylaxis: Heparin 8. GI prophylaxis: None 9. gen weakness with right hemiparesis due to old CVA. Pt is bed bound. Has not improved with her weakness. PT has seen her.  Pt request to go home at d/c. Has supportive family help at home  Management plans discussed with the patient and  in agreement.  CODE STATUS: The patient is a DO NOT RESUSCITATE.   DVT Prophylaxis: heparin  TOTAL TIME TAKING CARE OF THIS PATIENT: 65mnutes.  >50% time spent on counselling and coordination of care  POSSIBLE D/C IN 1-2 DAYS, DEPENDING ON CLINICAL CONDITION.   PATEL,SONA M.D on 10/30/2014 at 4:46 PM  Between 7am to 6pm - Pager - [phone]  After 6pm go to [email or web address] - password [email or web address]  Office  [phone] CC: Primary care physician; BEllamae Sia MD

## 2014-10-31 DIAGNOSIS — R0602 Shortness of breath: Secondary | ICD-10-CM | POA: Diagnosis not present

## 2014-10-31 DIAGNOSIS — D72819 Decreased white blood cell count, unspecified: Secondary | ICD-10-CM | POA: Diagnosis not present

## 2014-10-31 DIAGNOSIS — R531 Weakness: Secondary | ICD-10-CM | POA: Diagnosis not present

## 2014-10-31 DIAGNOSIS — J189 Pneumonia, unspecified organism: Secondary | ICD-10-CM | POA: Diagnosis not present

## 2014-10-31 LAB — GLUCOSE, CAPILLARY
Glucose-Capillary: 76 mg/dL (ref 65–99)
Glucose-Capillary: 121 mg/dL — ABNORMAL HIGH (ref 65–99)
Glucose-Capillary: 74 mg/dL (ref 65–99)
Glucose-Capillary: 154 mg/dL — ABNORMAL HIGH (ref 65–99)

## 2014-10-31 LAB — CBC
HCT: 29.9 % — ABNORMAL LOW (ref 35.0–47.0)
HEMOGLOBIN: 9.6 g/dL — AB (ref 12.0–16.0)
MCH: 26.2 pg (ref 26.0–34.0)
MCHC: 32.1 g/dL (ref 32.0–36.0)
MCV: 81.6 fL (ref 80.0–100.0)
Platelets: 435 10*3/uL (ref 150–440)
RBC: 3.66 MIL/uL — ABNORMAL LOW (ref 3.80–5.20)
RDW: 15.4 % — AB (ref 11.5–14.5)
WBC: 24.6 10*3/uL — ABNORMAL HIGH (ref 3.6–11.0)

## 2014-10-31 MED ORDER — SODIUM CHLORIDE 0.9 % IV SOLN
INTRAVENOUS | Status: DC
  Administered 2014-10-31: 18:00:00 via INTRAVENOUS

## 2014-10-31 NOTE — Progress Notes (Signed)
[place] [facility] [place] NAME: Brenda Hogan    MR#:  [id number]  DATE OF BIRTH:  [birth year]  SUBJECTIVE:  Came in after family found to be confused. Mentation back to baseline. Pt has no complaints today. Bed bound at home Per R HR earlier up i the 120's  REVIEW OF SYSTEMS:   Review of Systems  Constitutional: Negative for fever, chills and weight loss.  HENT: Negative for ear discharge, ear pain and nosebleeds.   Eyes: Negative for blurred vision, pain and discharge.  Respiratory: Negative for sputum production, shortness of breath, wheezing and stridor.   Cardiovascular: Negative for chest pain, palpitations, orthopnea and PND.  Gastrointestinal: Negative for nausea, vomiting, abdominal pain and diarrhea.  Genitourinary: Negative for urgency and frequency.  Musculoskeletal: Positive for back pain. Negative for joint pain.  Neurological: Positive for weakness. Negative for sensory change, speech change and focal weakness.  Psychiatric/Behavioral: Negative for depression and hallucinations. The patient is not nervous/anxious.   All other systems reviewed and are negative.  Tolerating Diet:yes Tolerating PT: bedbound  DRUG ALLERGIES:  No Known Allergies  VITALS:  Blood pressure 94/73, pulse 87, temperature 100.9 F (38.3 C), temperature source Oral, resp. rate 18, height 5' 1"  (1.549 m), weight 73.664 kg (162 lb 6.4 oz), SpO2 97 %.  PHYSICAL EXAMINATION:   Physical Exam  GENERAL:  80 y.o.-year-old patient lying in the bed with no acute distress.  EYES: Pupils equal, round, reactive to light and accommodation. No scleral icterus. Extraocular muscles intact.  HEENT: Head atraumatic, normocephalic. Oropharynx and nasopharynx clear.  NECK:  Supple, no jugular venous distention. No thyroid enlargement, no tenderness.  LUNGS: Normal breath sounds bilaterally, no wheezing, rales, rhonchi. No use of accessory muscles of respiration.   CARDIOVASCULAR: S1, S2 normal. No murmurs, rubs, or gallops.  ABDOMEN: Soft, nontender, nondistended. Bowel sounds present. No organomegaly or mass.  EXTREMITIES: No cyanosis, clubbing or edema b/l.    NEUROLOGIC: Cranial nerves II through XII are intact. No focal Motor or sensory deficits b/l.   PSYCHIATRIC: The patient is alert and oriented x 3.  SKIN: pressure ulcer, chronic legs  LABORATORY PANEL:   CBC  Recent Labs Lab 10/30/14 1050  WBC 33.3*  HGB 9.9*  HCT 31.7*  PLT 421    Chemistries   Recent Labs Lab 10/29/14 2200  NA 130*  K 4.3  CL 93*  CO2 26  GLUCOSE 198*  BUN 17  CREATININE 0.70  CALCIUM 9.2    Cardiac Enzymes  Recent Labs Lab 10/29/14 2200  TROPONINI <0.03    RADIOLOGY:  Dg Chest Port 1 View  10/29/2014   CLINICAL DATA:  Brief. Altered mental status today. Oxygen requirement.  EXAM: PORTABLE CHEST - 1 VIEW  COMPARISON:  06/14/2014  FINDINGS: The heart is enlarged but stable in appearance. Bilateral pleural effusions are suspected. There are no focal consolidations. No evidence for edema.  IMPRESSION: 1. Stable cardiomegaly. 2. Small effusions are also suspected, stable in appearance.   Electronically Signed   By: Nolon Nations M.D.   On: 10/29/2014 22:17   Dg Hip Unilat With Pelvis 2-3 Views Left  10/30/2014   CLINICAL DATA:  Generalize left hip pain for several days.  EXAM: LEFT HIP (WITH PELVIS) 2-3 VIEWS  COMPARISON:  None.  FINDINGS: The bones appear diffusely osteopenic. Mild degenerative changes are noted involving both hips. There is no acute fracture or subluxation identified.  IMPRESSION: 1. Osteopenia and mild degenerative change.  2. No displaced fracture noted. If there is high clinical suspicion for occult fracture or the patient refuses to weightbear, consider further evaluation with MRI. Although CT is expeditious, evidence is lacking regarding accuracy of CT over plain film radiography.   Electronically Signed   By: Kerby Moors  M.D.   On: 10/30/2014 10:38    ASSESSMENT AND PLAN:   80 year old Caucasian female admitted for generalized weakness. 1.Altered mental status -resolved, unclear etio 2. Hypoxia: Resolved. The patient has underlying COPD and thus is within target range of 88-92% SpO2. The patient's lungs are clear and chest x-ray shows no evidence of infiltrate although there are small bilateral effusions that are likely chronic and insignificant in regard to her home oximetry readings. -uses chronic home oxygen 3liter/min sats 96-97% 3. COPD: Stable. Continue inhalers per home regimen 4. Leukocytosis: Hematology and oncology consult placed. The patient does not want a bone marrow biopsy. -will arrange out pt f/u with Hematology.  5. Diabetes mellitus type 2: Sliding scale insulin while hospitalized in addition to reduced basal insulin dosing 6. Combined systolic and diastolic congestive heart failure: Stable; continue Lasix per home regimen 7PAT/Tachycardia. Pt asymptomatic -cont BB 8. gen weakness with right hemiparesis due to old CVA. Pt is bed bound. Has not improved with her weakness. PT has seen her.  Pt request to go home at d/c. Has supportive family help at home. Spoke with dter  Management plans discussed with the patient and  in agreement.  CODE STATUS: The patient is a DO NOT RESUSCITATE.   DVT Prophylaxis: heparin  TOTAL TIME TAKING CARE OF THIS PATIENT: 30mnutes.   >50% time spent on counselling and coordination of care  POSSIBLE D/C IN 1-2 DAYS, DEPENDING ON CLINICAL CONDITION.   Merisa Julio M.D on 10/31/2014 at 12:13 PM  Between 7am to 6pm - Pager - [phone]  After 6pm go to [email or web address] - password [email or web address]  Office  [phone] CC: Primary care physician; BEllamae Sia MD

## 2014-10-31 NOTE — Progress Notes (Signed)
Reviewed home meds with Dr. Posey Pronto and advised to correct [facility] to match home meds.  Teley reported Sinus Arrthymia running 175 with PAT for 30 beats.  Returned to Sinus Arrhtymia 120 rate.  MD advised to give Metoprolol and Lisinopril.

## 2014-10-31 NOTE — Progress Notes (Signed)
Patient has right sided weakness.r/t  Past cva . Incontinent of urine and stool. Total care patient  Turn q 2 hrs.b/p low held b/p med . Telemetry in place.

## 2014-10-31 NOTE — Progress Notes (Signed)
Patient daughter Brenda Hogan very concerned about patient well being today.  Patient is more lethargic and refuses to eat or drink and feels warm.  Advised gave tylenol for fever and was rechecked and back to normal.  Family worried about patient being discharged potentially tomorrow.  Advised that is not determined until patient is stable.  Paged Dr. Posey Pronto and ordered CBC, Urine Cx, Blood Cx and approved I&O for urine specimen since patient is incontinent of urine.

## 2014-10-31 NOTE — Progress Notes (Signed)
[facility] Hematology/Oncology Progress Note  Date of admission: 10/29/2014  Hospital day:  10/31/2014  Chief Complaint: Brenda Hogan is a 80 y.o. female who was admitted through the emergency room with generalized weakness.  Subjective: the patient notes no new complaints.  She is sleeping during the middle of the day.  Social History: The patient is alone today.  Allergies: No Known Allergies  Scheduled Medications: . clopidogrel  75 mg Oral Daily  . docusate sodium  100 mg Oral BID  . feeding supplement (GLUCERNA SHAKE)  237 mL Oral BID BM  . furosemide  40 mg Oral Daily  . heparin  5,000 Units Subcutaneous 3 times per day  . insulin aspart  0-15 Units Subcutaneous TID WC  . insulin detemir  20 Units Subcutaneous QHS  . ipratropium-albuterol  3 mL Nebulization BID  . lisinopril  2.5 mg Oral Daily  . metoprolol  50 mg Oral BID  . pneumococcal 23 valent vaccine  0.5 mL Intramuscular Tomorrow-1000  . potassium chloride SA  20 mEq Oral Daily  . rosuvastatin  10 mg Oral Daily  . sodium chloride  3 mL Intravenous Q12H    Review of Systems: GENERAL: Fatigue and general weakness since stroke (no change). No fevers, sweats or weight loss. PERFORMANCE STATUS (ECOG): 4 HEENT: No visual changes, runny nose, sore throat, mouth sores or tenderness. Lungs: Cough and congestion (slight). No shortness of breath. No hemoptysis. Cardiac: No chest pain, palpitations, orthopnea, or PND. GI: No nausea, vomiting, diarrhea, constipation, melena or hematochezia. GU: No urgency, frequency, dysuria, or hematuria. Musculoskeletal: No back pain. No joint pain. No muscle tenderness. Extremities: No pain or swelling. Skin: Decubitus ulcer. No rashes or skin changes. Neuro: Right sided hemiparesis. No headache. Endocrine: Diabetes. No thyroid issues, hot flashes or night sweats. Psych: No mood changes, depression or anxiety. Pain: No focal  pain. Review of systems: All other systems reviewed and found to be negative.  Physical Exam: Blood pressure 94/73, pulse 87, temperature 100.9 F (38.3 C), temperature source Oral, resp. rate 18, height _0  (1.549 m), weight 162 lb 6.4 oz (73.664 kg), SpO2 97 %.  GENERAL: Chronically ill appearing woman lying comfortably on the medical unit in no acute distress. HEAD: [name] hair. Normocephalic, atraumatic, face symmetric, no Cushingoid features. EYES: Pupils equal round and reactive to light and accomodation. No conjunctivitis or scleral icterus. ENT: Edentulous. Oropharynx clear without lesion. Tongue normal. Mucous membranes moist.  NECK:  Sleeping with Zebra patterned neck support. RESPIRATORY: Clear to auscultation without rales, wheezes or rhonchi. CARDIOVASCULAR: Regular rate and rhythm without murmur, rub or gallop. ABDOMEN: Soft, non-tender, with active bowel sounds, and no hepatosplenomegaly. No masses. Sitting in stool. SKIN: No rashes, ulcers or lesions. EXTREMITIES: No edema, no skin discoloration or tenderness. No palpable cords. NEUROLOGICAL: Right arm and hand flexed s/p CVA. PSYCH: Appropriate.  Results for orders placed or performed during the hospital encounter of 10/29/14 (from the past 48 hour(s))  Basic metabolic panel     Status: Abnormal   Collection Time: 10/29/14 10:00 PM  Result Value Ref Range   Sodium 130 (L) 135 - 145 mmol/L   Potassium 4.3 3.5 - 5.1 mmol/L   Chloride 93 (L) 101 - 111 mmol/L   CO2 26 22 - 32 mmol/L   Glucose, Bld 198 (H) 65 - 99 mg/dL   BUN 17 6 - 20 mg/dL   Creatinine, Ser 0.70 0.44 - 1.00 mg/dL   Calcium 9.2 8.9 - 10.3 mg/dL  GFR calc non Af Amer >60 >60 mL/min   GFR calc Af Amer >60 >60 mL/min    Comment: (NOTE) The eGFR has been calculated using the CKD EPI equation. This calculation has not been validated in all clinical situations. eGFR's persistently <60 mL/min signify possible Chronic Kidney Disease.     Anion gap 11 5 - 15  CBC     Status: Abnormal   Collection Time: 10/29/14 10:00 PM  Result Value Ref Range   WBC 41.4 (H) 3.6 - 11.0 K/uL   RBC 4.21 3.80 - 5.20 MIL/uL   Hemoglobin 11.2 (L) 12.0 - 16.0 g/dL   HCT 34.3 (L) 35.0 - 47.0 %   MCV 81.6 80.0 - 100.0 fL   MCH 26.5 26.0 - 34.0 pg   MCHC 32.5 32.0 - 36.0 g/dL   RDW 15.1 (H) 11.5 - 14.5 %   Platelets 460 (H) 150 - 440 K/uL  Troponin I     Status: None   Collection Time: 10/29/14 10:00 PM  Result Value Ref Range   Troponin I <0.03 <0.031 ng/mL    Comment:        NO INDICATION OF MYOCARDIAL INJURY.   Hemoglobin A1c     Status: Abnormal   Collection Time: 10/29/14 10:00 PM  Result Value Ref Range   Hgb A1c MFr Bld 6.4 (H) 4.0 - 6.0 %  Urinalysis complete, with microscopic     Status: Abnormal   Collection Time: 10/29/14 11:10 PM  Result Value Ref Range   Color, Urine AMBER (A) YELLOW   APPearance HAZY (A) CLEAR   Glucose, UA NEGATIVE NEGATIVE mg/dL   Bilirubin Urine NEGATIVE NEGATIVE   Ketones, ur TRACE (A) NEGATIVE mg/dL   Specific Gravity, Urine 1.019 1.005 - 1.030   Hgb urine dipstick 1+ (A) NEGATIVE   pH 5.0 5.0 - 8.0   Protein, ur 30 (A) NEGATIVE mg/dL   Nitrite NEGATIVE NEGATIVE   Leukocytes, UA NEGATIVE NEGATIVE   RBC / HPF 6-30 0 - 5 RBC/hpf   WBC, UA 0-5 0 - 5 WBC/hpf   Bacteria, UA RARE (A) NONE SEEN   Squamous Epithelial / LPF 6-30 (A) NONE SEEN   Mucous PRESENT    Hyaline Casts, UA PRESENT   Glucose, capillary     Status: Abnormal   Collection Time: 10/30/14  7:43 AM  Result Value Ref Range   Glucose-Capillary 196 (H) 65 - 99 mg/dL   Comment 1 Notify RN   CBC with Differential     Status: Abnormal   Collection Time: 10/30/14 10:50 AM  Result Value Ref Range   WBC 33.3 (H) 3.6 - 11.0 K/uL   RBC 3.85 3.80 - 5.20 MIL/uL   Hemoglobin 9.9 (L) 12.0 - 16.0 g/dL   HCT 31.7 (L) 35.0 - 47.0 %   MCV 82.4 80.0 - 100.0 fL   MCH 25.6 (L) 26.0 - 34.0 pg   MCHC 31.1 (L) 32.0 - 36.0 g/dL   RDW 15.0 (H) 11.5 -  14.5 %   Platelets 421 150 - 440 K/uL   Neutrophils Relative % 91 %   Neutro Abs 30.3 (H) 1.4 - 6.5 K/uL   Lymphocytes Relative 6 %   Lymphs Abs 2.0 1.0 - 3.6 K/uL   Monocytes Relative 3 %   Monocytes Absolute 0.8 0.2 - 0.9 K/uL   Eosinophils Relative 0 %   Eosinophils Absolute 0.0 0 - 0.7 K/uL   Basophils Relative 0 %   Basophils Absolute 0.1 0 - 0.1  K/uL  Sedimentation rate     Status: Abnormal   Collection Time: 10/30/14 10:50 AM  Result Value Ref Range   Sed Rate 96 (H) 0 - 30 mm/hr  Lactate dehydrogenase     Status: None   Collection Time: 10/30/14 10:50 AM  Result Value Ref Range   LDH 119 98 - 192 U/L  Uric acid     Status: None   Collection Time: 10/30/14 10:50 AM  Result Value Ref Range   Uric Acid, Serum 6.3 2.3 - 6.6 mg/dL  Glucose, capillary     Status: Abnormal   Collection Time: 10/30/14 11:27 AM  Result Value Ref Range   Glucose-Capillary 135 (H) 65 - 99 mg/dL   Comment 1 Notify RN   C difficile quick scan w PCR reflex ([facility] only)     Status: None   Collection Time: 10/30/14 11:46 AM  Result Value Ref Range   C Diff antigen NEGATIVE    C Diff toxin NEGATIVE    C Diff interpretation Negative for C. difficile   Glucose, capillary     Status: Abnormal   Collection Time: 10/30/14  4:32 PM  Result Value Ref Range   Glucose-Capillary 117 (H) 65 - 99 mg/dL   Comment 1 Notify RN   Glucose, capillary     Status: Abnormal   Collection Time: 10/30/14  8:22 PM  Result Value Ref Range   Glucose-Capillary 152 (H) 65 - 99 mg/dL  Glucose, capillary     Status: None   Collection Time: 10/31/14  7:50 AM  Result Value Ref Range   Glucose-Capillary 76 65 - 99 mg/dL  Glucose, capillary     Status: Abnormal   Collection Time: 10/31/14 11:32 AM  Result Value Ref Range   Glucose-Capillary 121 (H) 65 - 99 mg/dL   Dg Chest Port 1 View  10/29/2014   CLINICAL DATA:  Brief. Altered mental status today. Oxygen requirement.  EXAM: PORTABLE CHEST - 1 VIEW  COMPARISON:  06/14/2014   FINDINGS: The heart is enlarged but stable in appearance. Bilateral pleural effusions are suspected. There are no focal consolidations. No evidence for edema.  IMPRESSION: 1. Stable cardiomegaly. 2. Small effusions are also suspected, stable in appearance.   Electronically Signed   By: Nolon Nations M.D.   On: 10/29/2014 22:17   Dg Hip Unilat With Pelvis 2-3 Views Left  10/30/2014   CLINICAL DATA:  Generalize left hip pain for several days.  EXAM: LEFT HIP (WITH PELVIS) 2-3 VIEWS  COMPARISON:  None.  FINDINGS: The bones appear diffusely osteopenic. Mild degenerative changes are noted involving both hips. There is no acute fracture or subluxation identified.  IMPRESSION: 1. Osteopenia and mild degenerative change. 2. No displaced fracture noted. If there is high clinical suspicion for occult fracture or the patient refuses to weightbear, consider further evaluation with MRI. Although CT is expeditious, evidence is lacking regarding accuracy of CT over plain film radiography.   Electronically Signed   By: Kerby Moors M.D.   On: 10/30/2014 10:38    Assessment:  Tatyanna Cronk is a 80 y.o. female status post CVA in 06/2014. She is completely disabled. Course has been complicated by pneumonia. With admissions, her WBC is elevated, but declines with treatment. Differential is predominantly neutrophils. She appears to have a leukamoid reaction. Differntial diagnosis includes CML (doubt). Exam reveals no adenopathy or hepatosplenomegaly.   Plan: 1. Hematology/Oncology:  WBC down from 41,400 to 33,300.  Peripheral smear reveals predominant neutrophils.  LDH  and uric acid normal.  Sedimentation rate high (96).  Given waxing and waning neutrophil count, suspect leukamoid reaction.  Will send BCR-ABL tomorrow (doubt CML).  Reassured patient no bone marrow needed.   Lequita Asal, MD  10/31/2014, 1:43 PM

## 2014-11-01 ENCOUNTER — Inpatient Hospital Stay: Payer: Medicare Other

## 2014-11-01 DIAGNOSIS — D72823 Leukemoid reaction: Secondary | ICD-10-CM | POA: Diagnosis present

## 2014-11-01 DIAGNOSIS — Z7982 Long term (current) use of aspirin: Secondary | ICD-10-CM | POA: Diagnosis not present

## 2014-11-01 DIAGNOSIS — Z8701 Personal history of pneumonia (recurrent): Secondary | ICD-10-CM | POA: Diagnosis not present

## 2014-11-01 DIAGNOSIS — R509 Fever, unspecified: Secondary | ICD-10-CM | POA: Diagnosis present

## 2014-11-01 DIAGNOSIS — E118 Type 2 diabetes mellitus with unspecified complications: Secondary | ICD-10-CM | POA: Diagnosis present

## 2014-11-01 DIAGNOSIS — J9621 Acute and chronic respiratory failure with hypoxia: Secondary | ICD-10-CM | POA: Diagnosis present

## 2014-11-01 DIAGNOSIS — I10 Essential (primary) hypertension: Secondary | ICD-10-CM | POA: Diagnosis present

## 2014-11-01 DIAGNOSIS — J189 Pneumonia, unspecified organism: Secondary | ICD-10-CM | POA: Diagnosis present

## 2014-11-01 DIAGNOSIS — Z7902 Long term (current) use of antithrombotics/antiplatelets: Secondary | ICD-10-CM | POA: Diagnosis not present

## 2014-11-01 DIAGNOSIS — I2510 Atherosclerotic heart disease of native coronary artery without angina pectoris: Secondary | ICD-10-CM | POA: Diagnosis present

## 2014-11-01 DIAGNOSIS — R401 Stupor: Secondary | ICD-10-CM | POA: Diagnosis present

## 2014-11-01 DIAGNOSIS — J441 Chronic obstructive pulmonary disease with (acute) exacerbation: Secondary | ICD-10-CM | POA: Diagnosis present

## 2014-11-01 DIAGNOSIS — Z79899 Other long term (current) drug therapy: Secondary | ICD-10-CM | POA: Diagnosis not present

## 2014-11-01 DIAGNOSIS — Z96653 Presence of artificial knee joint, bilateral: Secondary | ICD-10-CM | POA: Diagnosis present

## 2014-11-01 DIAGNOSIS — Z87891 Personal history of nicotine dependence: Secondary | ICD-10-CM | POA: Diagnosis not present

## 2014-11-01 DIAGNOSIS — I5040 Unspecified combined systolic (congestive) and diastolic (congestive) heart failure: Secondary | ICD-10-CM | POA: Diagnosis present

## 2014-11-01 DIAGNOSIS — A419 Sepsis, unspecified organism: Secondary | ICD-10-CM | POA: Diagnosis present

## 2014-11-01 DIAGNOSIS — Z794 Long term (current) use of insulin: Secondary | ICD-10-CM | POA: Diagnosis not present

## 2014-11-01 DIAGNOSIS — Z9981 Dependence on supplemental oxygen: Secondary | ICD-10-CM | POA: Diagnosis not present

## 2014-11-01 DIAGNOSIS — Z8249 Family history of ischemic heart disease and other diseases of the circulatory system: Secondary | ICD-10-CM | POA: Diagnosis not present

## 2014-11-01 DIAGNOSIS — I69351 Hemiplegia and hemiparesis following cerebral infarction affecting right dominant side: Secondary | ICD-10-CM | POA: Diagnosis not present

## 2014-11-01 DIAGNOSIS — Z66 Do not resuscitate: Secondary | ICD-10-CM | POA: Diagnosis present

## 2014-11-01 DIAGNOSIS — C9590 Leukemia, unspecified not having achieved remission: Secondary | ICD-10-CM | POA: Diagnosis present

## 2014-11-01 LAB — CBC WITH DIFFERENTIAL/PLATELET
Basophils Absolute: 0.1 10*3/uL (ref 0–0.1)
Basophils Relative: 0 %
Eosinophils Absolute: 0 10*3/uL (ref 0–0.7)
Eosinophils Relative: 0 %
HCT: 35.7 % (ref 35.0–47.0)
Hemoglobin: 11.3 g/dL — ABNORMAL LOW (ref 12.0–16.0)
Lymphocytes Relative: 7 %
Lymphs Abs: 2 10*3/uL (ref 1.0–3.6)
MCH: 26.5 pg (ref 26.0–34.0)
MCHC: 31.7 g/dL — ABNORMAL LOW (ref 32.0–36.0)
MCV: 83.7 fL (ref 80.0–100.0)
Monocytes Absolute: 0.8 10*3/uL (ref 0.2–0.9)
Monocytes Relative: 3 %
Neutro Abs: 27.4 10*3/uL — ABNORMAL HIGH (ref 1.4–6.5)
Neutrophils Relative %: 90 %
Platelets: 456 10*3/uL — ABNORMAL HIGH (ref 150–440)
RBC: 4.27 MIL/uL (ref 3.80–5.20)
RDW: 15.6 % — ABNORMAL HIGH (ref 11.5–14.5)
WBC: 30.3 10*3/uL — ABNORMAL HIGH (ref 3.6–11.0)

## 2014-11-01 LAB — GLUCOSE, CAPILLARY
Glucose-Capillary: 67 mg/dL (ref 65–99)
Glucose-Capillary: 68 mg/dL (ref 65–99)
Glucose-Capillary: 78 mg/dL (ref 65–99)
Glucose-Capillary: 86 mg/dL (ref 65–99)
Glucose-Capillary: 280 mg/dL — ABNORMAL HIGH (ref 65–99)
Glucose-Capillary: 161 mg/dL — ABNORMAL HIGH (ref 65–99)

## 2014-11-01 MED ORDER — CEFTRIAXONE SODIUM IN DEXTROSE 20 MG/ML IV SOLN
1.0000 g | INTRAVENOUS | Status: DC
  Administered 2014-11-01 – 2014-11-03 (×4): 1 g via INTRAVENOUS
  Filled 2014-11-01 (×5): qty 50

## 2014-11-01 MED ORDER — GLUCERNA SHAKE PO LIQD
237.0000 mL | Freq: Two times a day (BID) | ORAL | Status: DC
  Administered 2014-11-02 – 2014-11-03 (×2): 237 mL via ORAL

## 2014-11-01 MED ORDER — DEXTROSE 5 % IV SOLN
500.0000 mg | INTRAVENOUS | Status: DC
  Administered 2014-11-01: 500 mg via INTRAVENOUS
  Filled 2014-11-01 (×4): qty 500

## 2014-11-01 MED ORDER — DEXTROSE-NACL 5-0.9 % IV SOLN
INTRAVENOUS | Status: DC
  Administered 2014-11-01 (×2): via INTRAVENOUS

## 2014-11-01 NOTE — Progress Notes (Signed)
Spoke with dr Marcille Blanco on phone order to give tylenol for fever and iv fluids d/ced.. md will reassess patient today

## 2014-11-01 NOTE — Progress Notes (Signed)
Dr. Posey Pronto notified on the floor patients afib telemetry.

## 2014-11-01 NOTE — Progress Notes (Signed)
Patient receiving iv fluids this shift. Congested lung sounds . svn tx's given . Right -sided weakness r/t old  CVA.  Turned and repositioned q 2 hrs. Incontinent. Frequently. telemetry . Sinus Tach on telemetry o2 at 3-4 L via [place]

## 2014-11-01 NOTE — Progress Notes (Signed)
[place] at [place] NAME: Brenda Hogan    MR#:  [id number]  DATE OF BIRTH:  03/25/1935  SUBJECTIVE:  Very sleepy and lethargic. Fever last nite 101. Responds to painful stimuliBed bound at home Woke up momentarily   REVIEW OF SYSTEMS:   Review of Systems  Constitutional: Negative for fever, chills and weight loss.  HENT: Negative for ear discharge, ear pain and nosebleeds.   Eyes: Negative for blurred vision, pain and discharge.  Respiratory: Negative for sputum production, shortness of breath, wheezing and stridor.   Cardiovascular: Negative for chest pain, palpitations, orthopnea and PND.  Gastrointestinal: Negative for nausea, vomiting, abdominal pain and diarrhea.  Genitourinary: Negative for urgency and frequency.  Musculoskeletal: Positive for back pain. Negative for joint pain.  Neurological: Positive for weakness. Negative for sensory change, speech change and focal weakness.  Psychiatric/Behavioral: Negative for depression and hallucinations. The patient is not nervous/anxious.   All other systems reviewed and are negative.  Tolerating Diet:not today Tolerating PT: bedbound  DRUG ALLERGIES:  No Known Allergies  VITALS:  Blood pressure 103/50, pulse 79, temperature 98 F (36.7 C), temperature source Oral, resp. rate 16, height 5' 1"  (1.549 m), weight 73.528 kg (162 lb 1.6 oz), SpO2 97 %.  PHYSICAL EXAMINATION:   Physical Exam  GENERAL:  80 y.o.-year-old patient lying in the bed with no acute distress. lethargic EYES: Pupils equal, round, reactive to light and accommodation. No scleral icterus. Extraocular muscles intact.  HEENT: Head atraumatic, normocephalic. Oropharynx dry and nasopharynx clear.  NECK:  Supple, no jugular venous distention. No thyroid enlargement, no tenderness.  LUNGS: decreased breath sounds bilaterally, no wheezing, rales, rhonchi. No use of accessory muscles of respiration.   CARDIOVASCULAR: S1, S2 normal. No murmurs, rubs, or gallops.  ABDOMEN: Soft, nontender, nondistended. Bowel sounds present. No organomegaly or mass.  EXTREMITIES: No cyanosis, clubbing or edema b/l.    NEUROLOGIC: unable to assess today due to lethargy   PSYCHIATRIC: lethargic SKIN: pressure ulcer, chronic legs  LABORATORY PANEL:   CBC  Recent Labs Lab 11/01/14 0421  WBC 30.3*  HGB 11.3*  HCT 35.7  PLT 456*    Chemistries   Recent Labs Lab 10/29/14 2200  NA 130*  K 4.3  CL 93*  CO2 26  GLUCOSE 198*  BUN 17  CREATININE 0.70  CALCIUM 9.2    Cardiac Enzymes  Recent Labs Lab 10/29/14 2200  TROPONINI <0.03    RADIOLOGY:  Dg Chest 1 View  11/01/2014   CLINICAL DATA:  Chest congestion today.  EXAM: CHEST  1 VIEW  COMPARISON:  10/29/2014  FINDINGS: There is cardiomegaly. Bilateral perihilar and lower lobe airspace opacities with small bilateral effusions. Findings likely reflect edema/ CHF. No acute bony abnormality.  IMPRESSION: Bilateral perihilar and lower lobe opacities, likely edema/ CHF. Small bilateral effusions.   Electronically Signed   By: Rolm Baptise M.D.   On: 11/01/2014 13:29    ASSESSMENT AND PLAN:   80 year old Caucasian female admitted for generalized weakness. 1.Altered mental status -resolved, unclear etio 2.Sepsis and Hypoxia:due to bilateral pneumonia as noted on CXR with fever,elevated wbc and fever  -IV rocephin and zithromax -speech to eval for aspiration -nebs prn --uses chronic home oxygen 3liter/min sats 96-97% 3. COPD: Stable. Continue inhalers per home regimen 4. Leukocytosis: Hematology and oncology consult placed. The patient does not want a bone marrow biopsy. -will arrange out pt f/u with Hematology.  -appears leukemoid reaction possible with underlying infection 5.  Diabetes mellitus type 2: Sliding scale insulin while hospitalized  -D5 with NS due to poor po intake and low BS 6. Combined systolic and diastolic congestive heart  failure: Stable; continue Lasix per home regimen 7PAT/Tachycardia. Pt asymptomatic -cont BB 8. gen weakness with right hemiparesis due to old CVA. Pt is bed bound. Has not improved with her weakness. PT has seen her.   Spoke with dter Jeannene Patella and updated  Management plans discussed with the patient and  in agreement.  CODE STATUS: The patient is a DO NOT RESUSCITATE.   DVT Prophylaxis: heparin  TOTAL TIME TAKING CARE OF THIS PATIENT: 52mnutes.   >50% time spent on counselling and coordination of care  POSSIBLE D/C IN 1-2 DAYS, DEPENDING ON CLINICAL CONDITION.   Brantlee Penn M.D on 11/01/2014 at 3:04 PM  Between 7am to 6pm - Pager - [phone]  After 6pm go to [email or web address] - password [email or web address]  Office  [phone] CC: Primary care physician; BEllamae Sia MD

## 2014-11-01 NOTE — Progress Notes (Signed)
Inpatient Diabetes Program Recommendations  AACE/ADA: New Consensus Statement on Inpatient Glycemic Control (2013)  Target Ranges:  Prepandial:   less than 140 mg/dL      Peak postprandial:   less than 180 mg/dL (1-2 hours)      Critically ill patients:  140 - 180 mg/dL     Results for Brenda Hogan, Brenda Hogan (MRN [id number]) as of 11/01/2014 08:38  Ref. Range 10/31/2014 07:50 10/31/2014 11:32 10/31/2014 16:18 10/31/2014 21:54  Glucose-Capillary Latest Ref Range: 65-99 mg/dL 76 121 (H) 74 154 (H)    Results for Brenda Hogan, Brenda Hogan (MRN [id number]) as of 11/01/2014 08:38  Ref. Range 11/01/2014 07:34  Glucose-Capillary Latest Ref Range: 65-99 mg/dL 84     Admit with: Weakness  History: DM, HTN, CVA, COPD  Home DM Meds: Levemir 30 units QHS       Novolog SSI  Current DM Orders: Levemir 20 units QHS            Novolog Moderate SSI (0-15 units) tid with meals    **Noted that patient has received 3 units Novolog the last two nights at bedtime per the Moderate SSI (0-15 units), however, bedtime coverage of blood sugars was not ordered by MD.  **As a result, patient's fingerstick blood sugars have been low the last two mornings.  Hypoglycemic today with CBG of 67 mg/dl.  **Patient not eating well.    MD- Please consider the following in-hospital insulin adjustments:  1. Reduce Levemir slightly to 18 units QHS  2. Reduce Novolog SSI to Sensitive scale (0-9 units) tid with meals     Will follow Wyn Quaker RN, MSN, CDE Diabetes Coordinator Inpatient Glycemic Control Team Team Pager: [phone] (8a-5p)

## 2014-11-01 NOTE — Clinical Documentation Improvement (Addendum)
Supporting Information: AMS noted per 6/24.    . Document the etiology of the altered mental status as: --Coma --Confusion/delirium (including drug-induced) --Drowsiness/somnolence --Stupor/semi-coma --Transient alteration of awareness --Encephalopathy Alcoholic Anoxic/hypoxic Drug-induced/toxic (specify drug) Hepatic Hypertensive Hypoglycemic Metabolic/septic Traumatic/post-concussion Wernicke Other (specify) . Document any associated diagnoses/conditions     Thank You,   Metabolic/septic

## 2014-11-01 NOTE — Clinical Documentation Improvement (Signed)
Supporting Information: Patient with a history of COPD, is on home oxygen, possible hypoxia earlier per 6/24 progress notes.   . Document acuity: --Acute respiratory failure --Chronic respiratory failure --Acute on chronic respiratory failure . Document inclusion of: --Hypoxia --Hypercapnia . Document tobacco: --Use --Abuse --History of . Document any associated diagnoses/conditions     Thank Sherian Maroon Documentation Specialist [phone] [email or web address]

## 2014-11-01 NOTE — Progress Notes (Signed)
OT Cancellation Note  Patient Details Name: Brenda Hogan MRN: [id number] DOB: 12/26/1934   Cancelled Treatment:     Attempted to see patient. Checked on patient and with nursing. Patient lethargic. Nursing states not appropriate to work with today.   Sharon Mt [place] Mt, MS/OTR/L  11/01/2014, 10:04 AM

## 2014-11-01 NOTE — Progress Notes (Signed)
Patients blood sugar was 68 rechecked and 67, Dr. Posey Pronto notified, D5 NS at 75 started will recheck and ordered to give amp if still low.

## 2014-11-01 NOTE — Progress Notes (Signed)
Initial Nutrition Assessment  INTERVENTION:  Meals and Snacks: Cater to patient preferences; will send yogurt as a 10am snack Medical Food Supplement Therapy: agree with Glucerna shake as ordered, will also send Magic Cup BID for pt to try, including sending as bedtime snack to aid in nocturnal low blood sugars  NUTRITION DIAGNOSIS:  Inadequate oral intake related to acute illness as evidenced by meal completion < 25%, per patient/family report.  GOAL:  Patient will meet greater than or equal to 90% of their needs  MONITOR:   (Energy Intake, Anthropometrics, Glucose Profile, Digestive System)  REASON FOR ASSESSMENT:  Consult, Low Braden Poor PO  ASSESSMENT:  Pt admitted with AMS, sepsis and hypoxia with fever. Per MD note pt s/p stroke February 2016, bedbound with weakness/right hemiparesis. PMHx:  Past Medical History  Diagnosis Date  . Stroke   . COPD (chronic obstructive pulmonary disease)   . Coronary artery disease   . Hypertension   . Diabetes mellitus type 1    Diet Order:  Diet heart healthy/carb modified Room service appropriate?: Yes; Fluid consistency:: Thin; SLP following. Pt is a feeder, CNA Belinda aware and has been providing assistance  Current Nutrition: Pt ate bites of sweet potato and cole slaw from [employer] brought in by daughter. Per CNA Marliss Coots pt has not eaten anything else today but approximately 50% of Glucerna earlier. Per I/O chart pt eating <25% of meals since admission.  Food/Nutrition-Related History: Per daughter pt's mother passed away roughly 1.5 weeks ago and since then pt has had very poor po intake; reports po intake was already poor prior to passing but progressively has worsened. Pt reports not wanting to eat. Daughter reports having Glucerna shakes previously but doesn't drink that much of it.    Medications: Novolog, Lasix, D5 NS at 75mL/hr (providing 306kcals in 24 hours), Zofran, KCl  Electrolyte/Renal Profile and  Glucose Profile:   Recent Labs Lab 10/29/14 2200  NA 130*  K 4.3  CL 93*  CO2 26  BUN 17  CREATININE 0.70  CALCIUM 9.2  GLUCOSE 198*   Protein Profile: No results for input(s): ALBUMIN in the last 168 hours.  Gastrointestinal Profile: Last BM: 6/26   Nutrition-Focused Physical Exam Findings: Nutrition-Focused physical exam completed. Findings are WDL for fat depletion, muscle depletion, and edema.    Weight Change: Pt reports not knowing her weight trend recently. RD notes per [facility] encounters weight of 187lbs 1.5 years ago.  Anthropometrics: Height:  Ht Readings from Last 1 Encounters:  10/30/14 5\' 1"  (1.549 m)    Weight:  Wt Readings from Last 1 Encounters:  11/01/14 162 lb 1.6 oz (73.528 kg)    Ideal Body Weight: 47.7kg  Wt Readings from Last 10 Encounters:  11/01/14 162 lb 1.6 oz (73.528 kg)  05/13/13 187 lb 6.4 oz (85.004 kg)    BMI:  Body mass index is 30.64 kg/(m^2).  Estimated Nutritional Needs:  Kcal:  1187-1403kcals, BEE; 899kcals, TEE: (IF 1.1-1.3)(AF 1.2) using IBW of 47.7kg  Protein:  48-57g protein (1.0-1.2g/kg) using IBW of 47.7kg  Fluid:  1193-1458mL of fluid (25-64mL/kg) using IBW of 47.7kg  Skin:  Reviewed, no issues   EDUCATION NEEDS:  No education needs identified at this time   Intake/Output Summary (Last 24 hours) at 11/01/14 1524 Last data filed at 11/01/14 1241  Gross per 24 hour  Intake    940 ml  Output      0 ml  Net    940 ml  HIGH Care Level  Dwyane Luo, [place], [place] Pager [phone]

## 2014-11-01 NOTE — Progress Notes (Signed)
Recheck patient blood sugar and lower oxygen to 3LNC per Dr. Posey Pronto. Bloodsugar was 78 and Oxygen was 93 on 3LNC

## 2014-11-02 LAB — GLUCOSE, CAPILLARY
Glucose-Capillary: 196 mg/dL — ABNORMAL HIGH (ref 65–99)
Glucose-Capillary: 252 mg/dL — ABNORMAL HIGH (ref 65–99)
Glucose-Capillary: 79 mg/dL (ref 65–99)
Glucose-Capillary: 140 mg/dL — ABNORMAL HIGH (ref 65–99)

## 2014-11-02 LAB — URINE CULTURE: CULTURE: NO GROWTH

## 2014-11-02 MED ORDER — AZITHROMYCIN 250 MG PO TABS
500.0000 mg | ORAL_TABLET | Freq: Every day | ORAL | Status: DC
  Administered 2014-11-02 – 2014-11-03 (×2): 500 mg via ORAL
  Filled 2014-11-02 (×2): qty 2

## 2014-11-02 MED ORDER — INSULIN DETEMIR 100 UNIT/ML ~~LOC~~ SOLN
10.0000 [IU] | Freq: Every day | SUBCUTANEOUS | Status: DC
  Administered 2014-11-02: 10 [IU] via SUBCUTANEOUS
  Filled 2014-11-02 (×2): qty 0.1

## 2014-11-02 NOTE — Progress Notes (Signed)
[place] [facility] [place] NAME: Brenda Hogan    MR#:  [id number]  DATE OF BIRTH:  1934/07/30  SUBJECTIVE:    REVIEW OF SYSTEMS:   Review of Systems  Constitutional: Negative for fever, chills and weight loss.  HENT: Negative for ear discharge, ear pain and nosebleeds.   Eyes: Negative for blurred vision, pain and discharge.  Respiratory: Negative for sputum production, shortness of breath, wheezing and stridor.   Cardiovascular: Negative for chest pain, palpitations, orthopnea and PND.  Gastrointestinal: Negative for nausea, vomiting, abdominal pain and diarrhea.  Genitourinary: Negative for urgency and frequency.  Musculoskeletal: Positive for back pain. Negative for joint pain.  Neurological: Positive for weakness. Negative for sensory change, speech change and focal weakness.  Psychiatric/Behavioral: Negative for depression and hallucinations. The patient is not nervous/anxious.   All other systems reviewed and are negative.  Tolerating Diet:not today Tolerating PT: bedbound  DRUG ALLERGIES:  No Known Allergies  VITALS:  Blood pressure 127/56, pulse 99, temperature 98 F (36.7 C), temperature source Oral, resp. rate 20, height _0  (1.549 m), weight 72.031 kg (158 lb 12.8 oz), SpO2 95 %.  PHYSICAL EXAMINATION:   Physical Exam  GENERAL:  80 y.o.-year-old patient lying in the bed with no acute distress. Awake ad alert today EYES: Pupils equal, round, reactive to light and accommodation. No scleral icterus. Extraocular muscles intact.  HEENT: Head atraumatic, normocephalic. Oropharynx dry and nasopharynx clear.  NECK:  Supple, no jugular venous distention. No thyroid enlargement, no tenderness.  LUNGS: decreased breath sounds bilaterally, no wheezing, rales, rhonchi. No use of accessory muscles of respiration.  CARDIOVASCULAR: S1, S2 normal. No murmurs, rubs, or gallops.  ABDOMEN: Soft, nontender, nondistended. Bowel sounds  present. No organomegaly or mass.  EXTREMITIES: No cyanosis, clubbing or edema b/l.    NEUROLOGIC: unable to assess today due to lethargy   PSYCHIATRIC: lethargic SKIN: pressure ulcer, chronic legs  LABORATORY PANEL:   CBC  Recent Labs Lab 11/01/14 0421  WBC 30.3*  HGB 11.3*  HCT 35.7  PLT 456*    Chemistries   Recent Labs Lab 10/29/14 2200  NA 130*  K 4.3  CL 93*  CO2 26  GLUCOSE 198*  BUN 17  CREATININE 0.70  CALCIUM 9.2    Cardiac Enzymes  Recent Labs Lab 10/29/14 2200  TROPONINI <0.03    RADIOLOGY:  Dg Chest 1 View  11/01/2014   CLINICAL DATA:  Chest congestion today.  EXAM: CHEST  1 VIEW  COMPARISON:  10/29/2014  FINDINGS: There is cardiomegaly. Bilateral perihilar and lower lobe airspace opacities with small bilateral effusions. Findings likely reflect edema/ CHF. No acute bony abnormality.  IMPRESSION: Bilateral perihilar and lower lobe opacities, likely edema/ CHF. Small bilateral effusions.   Electronically Signed   By: Rolm Baptise M.D.   On: 11/01/2014 13:29    ASSESSMENT AND PLAN:   80 year old Caucasian female admitted for generalized weakness. 1.Altered mental status -resolved, due to pneumonia -improving  2.Sepsis and Hypoxia:due to bilateral pneumonia as noted on CXR with fever,elevated wbc and fever  -IV rocephin and zithromax -speech to eval for aspiration -nebs prn --uses chronic home oxygen 3liter/min sats 96-97%  3. COPD: Stable. Continue inhalers per home regimen  4. Leukocytosis: Hematology and oncology consult placed. The patient does not want a bone marrow biopsy. -will arrange out pt f/u with Hematology.  -appears leukemoid reaction possible with underlying infection  5. Diabetes mellitus type 2: Sliding scale insulin while hospitalized  -  recvd IV D5  due to poor po intake and low BS. Now improved Resume 1/2 dose  Lantus today  6. Combined systolic and diastolic congestive heart failure: Stable; continue Lasix per home  regimen  7PAT/Tachycardia. Pt asymptomatic -cont BB  8. gen weakness with right hemiparesis due to old CVA.  Pt is bed bound. Has not improved with her weakness. PT has seen her.   Spoke with dter Jeannene Patella and updated  Management plans discussed with the patient and  in agreement.  CODE STATUS: The patient is a DO NOT RESUSCITATE.   DVT Prophylaxis: heparin  TOTAL TIME TAKING CARE OF THIS PATIENT: 103mnutes.   >50% time spent on counselling and coordination of care  POSSIBLE D/C IN 1-2 DAYS, DEPENDING ON CLINICAL CONDITION.   Leeona Mccardle M.D on 11/02/2014 at 10:40 AM  Between 7am to 6pm - Pager - [phone]  After 6pm go to [email or web address] - password [email or web address] [email or web address]  Office  [phone] CC: Primary care physician; BEllamae Sia MD

## 2014-11-02 NOTE — Care Management (Signed)
Spoke son-in-law and he states that patient has not wanted to do anything since her boyfriend/mother died within last one year. He does not believe she would participate with rehab. He will talk to his wife (patient's daughter) and will call RNCM back this evening. The plan is for patient to return home followed by [facility]. MD states patient is getting better and palliative is not appropriate at this time.

## 2014-11-02 NOTE — Progress Notes (Signed)
Physical Therapy Treatment Patient Details Name: Brenda Hogan MRN: [id number] DOB: 09-15-1934 Today's Date: 11/02/2014    History of Present Illness Pt here with hypoxia    PT Comments    Pt not eager to participate with PT, but with some encouragement agrees to try exercises. Pt extremely lethargic today; difficulty keeping eyes open during exercises. Pt participation is limited with active assistance at best requiring cues to stay on task and awake.   Follow Up Recommendations  Home health PT     Equipment Recommendations       Recommendations for Other Services       Precautions / Restrictions Precautions Precautions: Fall Restrictions Weight Bearing Restrictions: No    Mobility  Bed Mobility               General bed mobility comments: Refused up in bed/out of bed  Transfers                    Ambulation/Gait                 Stairs            Wheelchair Mobility    Modified Rankin (Stroke Patients Only)       Balance                                    Cognition Arousal/Alertness: Lethargic Behavior During Therapy: Flat affect Overall Cognitive Status: Within Functional Limits for tasks assessed                      Exercises General Exercises - Lower Extremity Ankle Circles/Pumps: AAROM;Both;20 reps;Supine Quad Sets:  (attempted, pt does not comprehend exercise) Short Arc Quad: AAROM;Both;Supine;15 reps Heel Slides: AAROM;Both;15 reps;Supine Hip ABduction/ADduction: AAROM;Both;15 reps;Supine Straight Leg Raises: AAROM;Both;10 reps;Supine    General Comments        Pertinent Vitals/Pain Pain Assessment: No/denies pain    Home Living                      Prior Function            PT Goals (current goals can now be found in the care plan section) Progress towards PT goals: Not progressing toward goals - comment    Frequency  Min 2X/week    PT Plan Current plan  remains appropriate    Co-evaluation             End of Session   Activity Tolerance: Patient limited by lethargy Patient left: in bed;with call bell/phone within reach;with bed alarm set     Time: 1045-1100 PT Time Calculation (min) (ACUTE ONLY): 15 min  Charges:  $Therapeutic Exercise: 8-22 mins                    G Codes:      Charlaine Dalton 11/02/2014, 11:22 AM

## 2014-11-02 NOTE — Progress Notes (Signed)
Inpatient Diabetes Program Recommendations  AACE/ADA: New Consensus Statement on Inpatient Glycemic Control (2013)  Target Ranges:  Prepandial:   less than 140 mg/dL      Peak postprandial:   less than 180 mg/dL (1-2 hours)      Critically ill patients:  140 - 180 mg/dL     Results for Brenda Hogan, Brenda Hogan (MRN [id number]) as of 11/02/2014 08:25  Ref. Range 11/01/2014 07:34 11/01/2014 09:19 11/01/2014 10:32 11/01/2014 11:02 11/01/2014 15:02 11/01/2014 21:04  Glucose-Capillary Latest Ref Range: 65-99 mg/dL 67 68 78 86 280 (H) 161 (H)    Results for Brenda Hogan, Brenda Hogan (MRN [id number]) as of 11/02/2014 08:25  Ref. Range 11/02/2014 07:42  Glucose-Capillary Latest Ref Range: 65-99 mg/dL 196 (H)    Home DM Meds: Levemir 30 units QHS  Novolog SSI  Current DM Orders: Novolog Moderate SSI (0-15 units) tid with meals    **Note patient not eating well.  Poor PO Intake the last few days.  **Note that Levemir insulin (20 units QHS ) was stopped yesterday.  **Patient now with elevated glucose this AM (CBG 196 mg/dl).    MD- Please consider the following in-hospital insulin adjustments:  1. Add back 1/3 of patient's home dose of Levemir- Levemir 10 units QHS  2. Reduce Novolog SSI to Sensitive scale (0-9 units) tid with meals [currently ordered as Moderate scale: 0-15 units]   Will follow Wyn Quaker RN, MSN, CDE Diabetes Coordinator Inpatient Glycemic Control Team Team Pager: [phone] (8a-5p)

## 2014-11-02 NOTE — Clinical Documentation Improvement (Signed)
Supporting Information: Sepsis and hypoxia due to bilateral pneumonia as noted on CXR with fever and elevated WBC noted per 6/27 progress notes.   Please document in the progress notes and in the discharge summary if Sepsis and Pneumonia were present on admission (POA).     . Sepsis-specify causative organism if known---specify if POA. . Sepsis due to: --Device --Implant --Graft --Infusion . Severe sepsis-sepsis with organ dysfunction --Specify organ dysfunction Respiratory failure Encephalopathy Acute kidney failure Other (specify) --With or without organ dysfunction . Document septic shock if present . Document any associated diagnoses/conditions     Thank Sherian Maroon Documentation Specialist [phone] [email or web address]

## 2014-11-02 NOTE — Evaluation (Signed)
Clinical/Bedside Swallow Evaluation Patient Details  Name: Brenda Hogan MRN: [id number] Date of Birth: 30-Mar-1935  Today's Date: 11/02/2014 Time: SLP Start Time (ACUTE ONLY): 1440 SLP Stop Time (ACUTE ONLY): 1511 SLP Time Calculation (min) (ACUTE ONLY): 31 min  Past Medical History:  Past Medical History  Diagnosis Date  . Stroke   . COPD (chronic obstructive pulmonary disease)   . Coronary artery disease   . Hypertension   . Diabetes mellitus type 1    Past Surgical History:  Past Surgical History  Procedure Laterality Date  . Total knee arthroplasty Bilateral    HPI:      Assessment / Plan / Recommendation Clinical Impression  pt presents with a moderate oropharyngeal dysphagia charactorized by pt coughing with thin liquids as well as stationg she was unable to intake the hard bolus trials.  pt reports having speecdh therapy at home for swallowing and was placed back on a regular with thin diet. st educated pt reguarding swallow status and ssx aspiraiton.     Aspiration Risk  Moderate    Diet Recommendation Dysphagia 2 (Fine chop);Nectar   Medication Administration: Crushed with puree Compensations: Slow rate;Follow solids with liquid;Minimize environmental distractions    Other  Recommendations Oral Care Recommendations: Patient independent with oral care   Follow Up Recommendations       Frequency and Duration min 5x/week  1 week   Pertinent Vitals/Pain None reported     SLP Swallow Goals     Swallow Study Prior Functional Status       General Date of Onset: 11/02/14 Type of Study: Bedside swallow evaluation Diet Prior to this Study: Regular;Thin liquids Temperature Spikes Noted: N/A Respiratory Status: Supplemental O2 delivered via (comment) History of Recent Intubation: No Behavior/Cognition: Alert;Cooperative;Agitated Self-Feeding Abilities: Able to feed self Patient Positioning: Upright in bed Baseline Vocal Quality: Normal Volitional  Cough: Strong Volitional Swallow: Able to elicit    Oral/Motor/Sensory Function Overall Oral Motor/Sensory Function: Appears within functional limits for tasks assessed Labial Strength: Within Functional Limits Lingual Strength: Reduced   Ice Chips Ice chips: Not tested   Thin Liquid Thin Liquid: Impaired Oral Phase Functional Implications: Prolonged oral transit Pharyngeal  Phase Impairments: Suspected delayed Swallow;Wet Vocal Quality;Cough - Immediate    Nectar Thick Nectar Thick Liquid: Within functional limits   Honey Thick Honey Thick Liquid: Not tested   Puree Puree: Within functional limits   Solid   GO    Solid: Within functional limits Presentation: Self Fed Other Comments: WFL for semi solid, pt unable to initake regular bolus- cracker. stated it was too hard.       [place] Fae Pippin 11/02/2014,4:43 PM

## 2014-11-02 NOTE — Progress Notes (Signed)
Pt cont on IV antibiotics. Congested, non productive cough. PRN tylenol for fever, improved. PRN albuterol treatment for wheeze, effective. Rested between care. Tele-afib. O2 sats in 80's at times but pt reports not feeling distressed, resting. Will cont to monitor.

## 2014-11-03 LAB — GLUCOSE, CAPILLARY
Glucose-Capillary: 114 mg/dL — ABNORMAL HIGH (ref 65–99)
Glucose-Capillary: 64 mg/dL — ABNORMAL LOW (ref 65–99)
Glucose-Capillary: 128 mg/dL — ABNORMAL HIGH (ref 65–99)

## 2014-11-03 LAB — BASIC METABOLIC PANEL
Anion gap: 8 (ref 5–15)
BUN: 25 mg/dL — ABNORMAL HIGH (ref 6–20)
CHLORIDE: 101 mmol/L (ref 101–111)
CO2: 26 mmol/L (ref 22–32)
Calcium: 8.1 mg/dL — ABNORMAL LOW (ref 8.9–10.3)
Creatinine, Ser: 0.76 mg/dL (ref 0.44–1.00)
GFR calc non Af Amer: >60 mL/min (ref >60)
Glucose, Bld: 71 mg/dL (ref 65–99)
Potassium: 3.4 mmol/L — ABNORMAL LOW (ref 3.5–5.1)
Sodium: 135 mmol/L (ref 135–145)

## 2014-11-03 LAB — CBC
HEMATOCRIT: 28.6 % — AB (ref 35.0–47.0)
Hemoglobin: 9.1 g/dL — ABNORMAL LOW (ref 12.0–16.0)
MCH: 26.1 pg (ref 26.0–34.0)
MCHC: 31.8 g/dL — AB (ref 32.0–36.0)
MCV: 82.1 fL (ref 80.0–100.0)
PLATELETS: 469 10*3/uL — AB (ref 150–440)
RBC: 3.49 MIL/uL — ABNORMAL LOW (ref 3.80–5.20)
RDW: 15.4 % — ABNORMAL HIGH (ref 11.5–14.5)
WBC: 17.7 10*3/uL — ABNORMAL HIGH (ref 3.6–11.0)

## 2014-11-03 MED ORDER — INSULIN DETEMIR 100 UNIT/ML ~~LOC~~ SOLN: 10.0000 [IU] | Freq: Every day | SUBCUTANEOUS | Status: DC

## 2014-11-03 MED ORDER — CEFUROXIME AXETIL 500 MG PO TABS: 500.0000 mg | ORAL_TABLET | Freq: Two times a day (BID) | ORAL | Status: DC

## 2014-11-03 MED ORDER — AZITHROMYCIN 250 MG PO TABS: 250.0000 mg | ORAL_TABLET | Freq: Once | ORAL | Status: DC

## 2014-11-03 NOTE — Discharge Instructions (Signed)
Dysphagia 2 (Fine chop);Nectar   Medication Administration: Crushed with puree Compensations: Slow rate;Follow solids with liquid;Minimize environmental distractions   2 gram,ADA 1800 calorie diet Home health services to be resumed

## 2014-11-03 NOTE — Discharge Summary (Signed)
[facility] NAME: Brenda Hogan    MR#:  [id number]  DATE OF BIRTH:  05-03-1935  DATE OF ADMISSION:  10/29/2014 ADMITTING PHYSICIAN: Harrie Foreman, MD  DATE OF DISCHARGE: 11/03/2014  PRIMARY CARE PHYSICIAN: Ellamae Sia, MD    ADMISSION DIAGNOSIS:  Stupor [R40.1] Leukocytosis [D72.829] Weakness [R53.1] Pain [R52] COPD exacerbation [J44.1]  DISCHARGE DIAGNOSIS:  Sepsis due to Bilateral Pneumonia Acute on chronic hypoxic respiratory failure(chronic home oxygen 3L/min) Leukocyctosis  SECONDARY DIAGNOSIS:   Past Medical History  Diagnosis Date  . Stroke   . COPD (chronic obstructive pulmonary disease)   . Coronary artery disease   . Hypertension   . Diabetes mellitus type 1     HOSPITAL COURSE:   80 year old Caucasian female admitted for generalized weakness and altered mental status 1.Altered mental status -resolved, due to pneumonia -improving appears at baseline  2.Sepsis and Hypoxia:due to bilateral pneumonia as noted on CXR with fever,elevated wbc and fever  -pt was started on IV rocephin and zithromax--->will change to po zithromax and cefuroxime -speech to eval for aspiration noted. Recommends Dysphagia 2 diet with nectar thick liquids -nebs prn --uses chronic home oxygen 3liter/min sats 96-97%  3. COPD: Stable. Continue inhalers per home regimen -appears at baseline  4. Leukocytosis:  -pt was senn by Dr Alvia Grove from Hematology.  -appears leukemoid reaction possible with underlying infection -wbc 41K--->17 k -will have pt see Dr Alvia Grove as outpt for f/u WBC count and to ensure she does not require any further w/u -no indication for BM biopsy at present  5. Diabetes mellitus type 2: Sliding scale insulin while hospitalized  -recvd IV D5 due to poor po intake and low BS. Now improved Resume 1/2 dose Lantus ,SSI   6. Combined systolic and diastolic congestive heart failure:  Stable -continue Lasix ,BB, lisinopril per her home meds  7PAT/Tachycardia. Pt asymptomatic -cont BB -resolved  8. gen weakness with right hemiparesis due to old CVA.  Pt is bed bound. Has not improved with her weakness. PT has seen her.  Pt's daughter aware about discharge to home today. Will call her with d/c plans  DISCHARGE CONDITIONS:   Fair,stable  CONSULTS OBTAINED:    Dr Marline Backbone DRUG ALLERGIES:  No Known Allergies  DISCHARGE MEDICATIONS:   Current Discharge Medication List    START taking these medications   Details  azithromycin (ZITHROMAX) 250 MG tablet Take 1 tablet (250 mg total) by mouth once. Qty: 5 each, Refills: 0    cefUROXime (CEFTIN) 500 MG tablet Take 1 tablet (500 mg total) by mouth 2 (two) times daily with a meal. Qty: 14 tablet, Refills: 0      CONTINUE these medications which have CHANGED   Details  insulin detemir (LEVEMIR) 100 UNIT/ML injection Inject 0.1 mLs (10 Units total) into the skin at bedtime. Qty: 10 mL, Refills: 11      CONTINUE these medications which have NOT CHANGED   Details  clopidogrel (PLAVIX) 75 MG tablet Take 75 mg by mouth daily. Refills: 4    ipratropium-albuterol (DUONEB) 0.5-2.5 (3) MG/3ML SOLN Take 3 mLs by nebulization 2 (two) times daily. Qty: 360 mL, Refills: 0    lisinopril (PRINIVIL,ZESTRIL) 2.5 MG tablet Take 1 tablet (2.5 mg total) by mouth daily. Qty: 30 tablet, Refills: 0    metoprolol (LOPRESSOR) 50 MG tablet Take 1 tablet (50 mg total) by mouth 2 (two) times daily. Qty: 62 tablet, Refills: 0    rosuvastatin (  CRESTOR) 10 MG tablet Take 10 mg by mouth daily.    albuterol (PROVENTIL HFA;VENTOLIN HFA) 108 (90 BASE) MCG/ACT inhaler Inhale 2 puffs into the lungs every 6 (six) hours as needed for wheezing or shortness of breath. 2 puffs 3 times daily x 3 days then use as needed. Qty: 8.5 g, Refills: 0    ALPRAZolam (XANAX) 0.25 MG tablet Take 1 tablet (0.25 mg total) by mouth 2 (two) times  daily as needed for anxiety. Qty: 10 tablet, Refills: 0    aspirin 81 MG chewable tablet Chew 1 tablet (81 mg total) by mouth daily.    atorvastatin (LIPITOR) 40 MG tablet Take 1 tablet (40 mg total) by mouth daily at 6 PM. Qty: 31 tablet, Refills: 0    feeding supplement, GLUCERNA SHAKE, (GLUCERNA SHAKE) LIQD Take 237 mLs by mouth 2 (two) times daily between meals. Refills: 0    furosemide (LASIX) 40 MG tablet Take 1 tablet (40 mg total) by mouth daily. Qty: 30 tablet, Refills: 0    insulin aspart (NOVOLOG) 100 UNIT/ML injection Inject into the skin 3 (three) times daily before meals. Sliding scale    isosorbide-hydrALAZINE (BIDIL) 20-37.5 MG per tablet Take 1 tablet by mouth 3 (three) times daily. Qty: 30 tablet, Refills: 0    potassium chloride SA (K-DUR,KLOR-CON) 20 MEQ tablet Take 1 tablet (20 mEq total) by mouth daily. Qty: 31 tablet, Refills: 0       If you experience worsening of your admission symptoms, develop shortness of breath, life threatening emergency, suicidal or homicidal thoughts you must seek medical attention immediately by calling 911 or calling your MD immediately  if symptoms less severe.  You Must read complete instructions/literature along with all the possible adverse reactions/side effects for all the Medicines you take and that have been prescribed to you. Take any new Medicines after you have completely understood and accept all the possible adverse reactions/side effects.   Please note  You were cared for by a hospitalist during your hospital stay. If you have any questions about your discharge medications or the care you received while you were in the hospital after you are discharged, you can call the unit and asked to speak with the hospitalist on call if the hospitalist that took care of you is not available. Once you are discharged, your primary care physician will handle any further medical issues. Please note that NO REFILLS for any discharge  medications will be authorized once you are discharged, as it is imperative that you return to your primary care physician (or establish a relationship with a primary care physician if you do not have one) for your aftercare needs so that they can reassess your need for medications and monitor your lab values. Today   SUBJECTIVE   Feels better. Tolerating po pureed diet. Breathing appears at baseline. No fever  VITAL SIGNS:  Blood pressure 163/61, pulse 72, temperature 98 F (36.7 C), temperature source Oral, resp. rate 16, height 5\' 1"  (1.549 m), weight 74.163 kg (163 lb 8 oz), SpO2 96 %.  I/O:   Intake/Output Summary (Last 24 hours) at 11/03/14 0905 Last data filed at 11/02/14 1700  Gross per 24 hour  Intake    240 ml  Output      0 ml  Net    240 ml    PHYSICAL EXAMINATION:  GENERAL:  80 y.o.-year-old patient lying in the bed with no acute distress.  EYES: Pupils equal, round, reactive to light and accommodation. No  scleral icterus. Extraocular muscles intact.  HEENT: Head atraumatic, normocephalic. Oropharynx and nasopharynx clear.  Edentulous, dry oral mucosa NECK:  Supple, no jugular venous distention. No thyroid enlargement, no tenderness.  LUNGS: decreased breath sounds bilaterally, no wheezing, rales,rhonchi or crepitation. No use of accessory muscles of respiration.  CARDIOVASCULAR: S1, S2 normal. No murmurs, rubs, or gallops.  ABDOMEN: Soft, non-tender, non-distended. Bowel sounds present. No organomegaly or mass.  EXTREMITIES: No pedal edema, cyanosis, or clubbing.  NEUROLOGIC: Cranial nerves II through XII are intact.Gait not checked. Pt has chronic right sided weakness due to old stroke. cotractures at both ankles PSYCHIATRIC: The patient is alert and oriented x 3.  SKIN: No obvious rash, lesion, or ulcer.   DATA REVIEW:   CBC   Recent Labs Lab 11/03/14 0421  WBC 17.7*  HGB 9.1*  HCT 28.6*  PLT 469*    Chemistries   Recent Labs Lab 11/03/14 0421  NA  135  K 3.4*  CL 101  CO2 26  GLUCOSE 71  BUN 25*  CREATININE 0.76  CALCIUM 8.1*    Microbiology Results   Recent Results (from the past 240 hour(s))  C difficile quick scan w PCR reflex ([facility] only)     Status: None   Collection Time: 10/30/14 11:46 AM  Result Value Ref Range Status   C Diff antigen NEGATIVE  Final   C Diff toxin NEGATIVE  Final   C Diff interpretation Negative for C. difficile  Final  Culture, blood (routine x 2)     Status: None (Preliminary result)   Collection Time: 10/31/14  3:11 PM  Result Value Ref Range Status   Specimen Description BLOOD  Final   Special Requests Immunocompromised  Final   Culture NO GROWTH 2 DAYS  Final   Report Status PENDING  Incomplete  Culture, blood (routine x 2)     Status: None (Preliminary result)   Collection Time: 10/31/14  3:11 PM  Result Value Ref Range Status   Specimen Description BLOOD  Final   Special Requests Immunocompromised  Final   Culture NO GROWTH 2 DAYS  Final   Report Status PENDING  Incomplete  Urine culture     Status: None   Collection Time: 10/31/14  3:45 PM  Result Value Ref Range Status   Specimen Description URINE, CLEAN CATCH  Final   Special Requests Immunocompromised  Final   Culture NO GROWTH 2 DAYS  Final   Report Status 11/02/2014 FINAL  Final    RADIOLOGY:  Dg Chest 1 View  11/01/2014   CLINICAL DATA:  Chest congestion today.  EXAM: CHEST  1 VIEW  COMPARISON:  10/29/2014  FINDINGS: There is cardiomegaly. Bilateral perihilar and lower lobe airspace opacities with small bilateral effusions. Findings likely reflect edema/ CHF. No acute bony abnormality.  IMPRESSION: Bilateral perihilar and lower lobe opacities, likely edema/ CHF. Small bilateral effusions.   Electronically Signed   By: Rolm Baptise M.D.   On: 11/01/2014 13:29   Management plans discussed with the patient and  in agreement.will call and update daughter today  CODE STATUS:     Code Status Orders        Start     Ordered    10/30/14 0219  Do not attempt resuscitation (DNR)   Continuous    Question Answer Comment  In the event of cardiac or respiratory ARREST Do not call a "code blue"   In the event of cardiac or respiratory ARREST Do not perform Intubation, CPR, defibrillation  or ACLS   In the event of cardiac or respiratory ARREST Use medication by any route, position, wound care, and other measures to relive pain and suffering. May use oxygen, suction and manual treatment of airway obstruction as needed for comfort.      10/30/14 0219      TOTAL TIME TAKING CARE OF THIS PATIENT: 40 minutes.    Frankye Schwegel M.D on 11/03/2014 at 9:05 AM  Between 7am to 6pm - Pager - [phone] After 6pm go to [email or web address] - password [email or web address] [facility]  Office  [phone]  CC: Primary care physician; Ellamae Sia, MD

## 2014-11-03 NOTE — Progress Notes (Signed)
Patient is ready for D/C home today via EMS. RN Case Manager confirmed address with patient. Clinical Education officer, museum (CSW) prepared EMS packet including DNR form. RN aware of above. Please reconsult if future social work needs arise. CSW signing off.   Blima Rich, [place] [phone]

## 2014-11-03 NOTE — Care Management (Signed)
Patient discharging home today followed by [facility]. I have notified Santiago Glad with [facility] of patient discharge. Orders will be faxed when available to Santiago Glad. Patient will go by EMS per CSW/RN discussion yesterday. No further RNCM needs.

## 2014-11-03 NOTE — Progress Notes (Signed)
Patient being discharged to home today. Belongings packed IV removed and belongings packed. EMS called.

## 2014-11-03 NOTE — Care Management (Signed)
[facility] Message  Patient Details  Name: Brenda Hogan MRN: [id number] Date of Birth: 12/07/1934   Medicare Important Message Given:   Chauncey Mann, RN 11/03/2014, 9:23 AM

## 2014-11-05 LAB — C-REACTIVE PROTEIN

## 2014-11-06 LAB — CULTURE, BLOOD (ROUTINE X 2)
CULTURE: NO GROWTH
Culture: NO GROWTH

## 2014-12-21 ENCOUNTER — Emergency Department: Payer: Medicare Other

## 2014-12-21 ENCOUNTER — Encounter: Payer: Self-pay | Admitting: *Deleted

## 2014-12-21 ENCOUNTER — Inpatient Hospital Stay
Admission: EM | Admit: 2014-12-21 | Discharge: 2014-12-28 | DRG: 871 | Disposition: A | Discharge status: Home / Self Care | Payer: Medicare Other | Attending: Internal Medicine | Admitting: Internal Medicine

## 2014-12-21 DIAGNOSIS — Z794 Long term (current) use of insulin: Secondary | ICD-10-CM

## 2014-12-21 DIAGNOSIS — I2510 Atherosclerotic heart disease of native coronary artery without angina pectoris: Secondary | ICD-10-CM | POA: Diagnosis present

## 2014-12-21 DIAGNOSIS — J189 Pneumonia, unspecified organism: Secondary | ICD-10-CM | POA: Diagnosis not present

## 2014-12-21 DIAGNOSIS — Z7982 Long term (current) use of aspirin: Secondary | ICD-10-CM

## 2014-12-21 DIAGNOSIS — E109 Type 1 diabetes mellitus without complications: Secondary | ICD-10-CM | POA: Diagnosis present

## 2014-12-21 DIAGNOSIS — A419 Sepsis, unspecified organism: Secondary | ICD-10-CM | POA: Diagnosis not present

## 2014-12-21 DIAGNOSIS — L309 Dermatitis, unspecified: Secondary | ICD-10-CM | POA: Diagnosis present

## 2014-12-21 DIAGNOSIS — L89899 Pressure ulcer of other site, unspecified stage: Secondary | ICD-10-CM | POA: Diagnosis present

## 2014-12-21 DIAGNOSIS — R0689 Other abnormalities of breathing: Secondary | ICD-10-CM | POA: Diagnosis present

## 2014-12-21 DIAGNOSIS — I10 Essential (primary) hypertension: Secondary | ICD-10-CM | POA: Diagnosis present

## 2014-12-21 DIAGNOSIS — Z87891 Personal history of nicotine dependence: Secondary | ICD-10-CM

## 2014-12-21 DIAGNOSIS — E876 Hypokalemia: Secondary | ICD-10-CM | POA: Diagnosis present

## 2014-12-21 DIAGNOSIS — J9601 Acute respiratory failure with hypoxia: Secondary | ICD-10-CM | POA: Diagnosis present

## 2014-12-21 DIAGNOSIS — R0902 Hypoxemia: Secondary | ICD-10-CM

## 2014-12-21 DIAGNOSIS — G9341 Metabolic encephalopathy: Secondary | ICD-10-CM | POA: Diagnosis present

## 2014-12-21 DIAGNOSIS — J690 Pneumonitis due to inhalation of food and vomit: Secondary | ICD-10-CM | POA: Diagnosis present

## 2014-12-21 DIAGNOSIS — Z8673 Personal history of transient ischemic attack (TIA), and cerebral infarction without residual deficits: Secondary | ICD-10-CM

## 2014-12-21 DIAGNOSIS — J449 Chronic obstructive pulmonary disease, unspecified: Secondary | ICD-10-CM | POA: Diagnosis present

## 2014-12-21 DIAGNOSIS — Z96653 Presence of artificial knee joint, bilateral: Secondary | ICD-10-CM | POA: Diagnosis present

## 2014-12-21 DIAGNOSIS — Z8249 Family history of ischemic heart disease and other diseases of the circulatory system: Secondary | ICD-10-CM

## 2014-12-21 DIAGNOSIS — Z9119 Patient's noncompliance with other medical treatment and regimen: Secondary | ICD-10-CM | POA: Diagnosis present

## 2014-12-21 DIAGNOSIS — Z66 Do not resuscitate: Secondary | ICD-10-CM | POA: Diagnosis present

## 2014-12-21 LAB — CBC WITH DIFFERENTIAL/PLATELET
BASOS ABS: 0.1 10*3/uL (ref 0–0.1)
BASOS PCT: 0 %
EOS ABS: 0 10*3/uL (ref 0–0.7)
Eosinophils Relative: 0 %
HCT: 36.7 % (ref 35.0–47.0)
Hemoglobin: 11.4 g/dL — ABNORMAL LOW (ref 12.0–16.0)
Lymphocytes Relative: 3 %
Lymphs Abs: 1.3 10*3/uL (ref 1.0–3.6)
MCH: 26 pg (ref 26.0–34.0)
MCHC: 31 g/dL — ABNORMAL LOW (ref 32.0–36.0)
MCV: 83.8 fL (ref 80.0–100.0)
MONO ABS: 1.8 10*3/uL — AB (ref 0.2–0.9)
Monocytes Relative: 5 %
Neutro Abs: 34.5 10*3/uL — ABNORMAL HIGH (ref 1.4–6.5)
Neutrophils Relative %: 92 %
PLATELETS: 501 10*3/uL — AB (ref 150–440)
RBC: 4.38 MIL/uL (ref 3.80–5.20)
RDW: 16.7 % — AB (ref 11.5–14.5)
WBC: 37.7 10*3/uL — ABNORMAL HIGH (ref 3.6–11.0)

## 2014-12-21 MED ORDER — SODIUM CHLORIDE 0.9 % IV BOLUS (SEPSIS)
1000.0000 mL | INTRAVENOUS | Status: AC
  Administered 2014-12-21: 1000 mL via INTRAVENOUS

## 2014-12-21 MED ORDER — DEXTROSE 5 % IV SOLN: 1.0000 g | Freq: Once | INTRAVENOUS | Status: DC

## 2014-12-21 MED ORDER — DEXTROSE 5 % IV SOLN
2.0000 g | Freq: Once | INTRAVENOUS | Status: AC
  Administered 2014-12-22: 2 g via INTRAVENOUS
  Filled 2014-12-21: qty 2

## 2014-12-21 MED ORDER — DEXTROSE 5 % IV SOLN
500.0000 mg | Freq: Once | INTRAVENOUS | Status: AC
  Administered 2014-12-22: 500 mg via INTRAVENOUS
  Filled 2014-12-21: qty 500

## 2014-12-21 NOTE — ED Notes (Signed)
Pt arrived via EMS from home after family reported Altered mental status beginning today. Family reported last known well at 1000 this morning. EMS reports pt was mumbling and making no sense at home. Pt arrived to ED talking and asking questions. EMS reports this is an improvement. Pt has cough upon arrival. Pt oriented x 3 but not oriented to time.

## 2014-12-21 NOTE — ED Provider Notes (Signed)
[facility] Emergency Department Provider Note  ____________________________________________  Time seen: Approximately 11:16 PM  I have reviewed the triage vital signs and the nursing notes.   HISTORY  Chief Complaint Altered Mental Status  History limited by altered mentation  HPI Brenda Hogan is a 80 y.o. female who presents to the ED via EMS from home after family reported altered mental status beginning today. Patient was last seen in her normal state of health at 10 AM. EMS reports patient was mumbling and confused. Patient complains of loose, nonproductive cough and fever. Denies chest pain, abdominal pain, nausea, vomiting, diarrhea.   Past Medical History  Diagnosis Date  . Stroke   . COPD (chronic obstructive pulmonary disease)   . Coronary artery disease   . Hypertension   . Diabetes mellitus type 1     Patient Active Problem List   Diagnosis Date Noted  . Fever of unknown origin 11/01/2014  . Hypoxia 10/30/2014  . Acute combined systolic and diastolic CHF, NYHA class 1 05/11/2013  . HTN (hypertension) 05/11/2013  . Diabetes mellitus, type II 05/11/2013  . Acute encephalopathy 05/11/2013  . Hypokalemia 05/11/2013  . Diabetes 05/10/2013  . COPD with exacerbation 05/09/2013  . Influenza A 05/04/2013  . NSTEMI (non-ST elevated myocardial infarction) 05/04/2013  . Acute respiratory failure 05/01/2013  . Sepsis 05/01/2013    Past Surgical History  Procedure Laterality Date  . Total knee arthroplasty Bilateral     Current Outpatient Rx  Name  Route  Sig  Dispense  Refill  . albuterol (PROVENTIL HFA;VENTOLIN HFA) 108 (90 BASE) MCG/ACT inhaler   Inhalation   Inhale 2 puffs into the lungs every 6 (six) hours as needed for wheezing or shortness of breath. 2 puffs 3 times daily x 3 days then use as needed.   8.5 g   0   . ALPRAZolam (XANAX) 0.25 MG tablet   Oral   Take 1 tablet (0.25 mg total) by mouth 2 (two) times daily as  needed for anxiety.   10 tablet   0   . aspirin 81 MG chewable tablet   Oral   Chew 1 tablet (81 mg total) by mouth daily. Patient not taking: Reported on 10/30/2014         . atorvastatin (LIPITOR) 40 MG tablet   Oral   Take 1 tablet (40 mg total) by mouth daily at 6 PM.   31 tablet   0   . azithromycin (ZITHROMAX) 250 MG tablet   Oral   Take 1 tablet (250 mg total) by mouth once.   5 each   0   . cefUROXime (CEFTIN) 500 MG tablet   Oral   Take 1 tablet (500 mg total) by mouth 2 (two) times daily with a meal.   14 tablet   0   . clopidogrel (PLAVIX) 75 MG tablet   Oral   Take 75 mg by mouth daily.      4   . feeding supplement, GLUCERNA SHAKE, (GLUCERNA SHAKE) LIQD   Oral   Take 237 mLs by mouth 2 (two) times daily between meals.      0   . furosemide (LASIX) 40 MG tablet   Oral   Take 1 tablet (40 mg total) by mouth daily. Patient not taking: Reported on 10/31/2014   30 tablet   0   . insulin aspart (NOVOLOG) 100 UNIT/ML injection   Subcutaneous   Inject into the skin 3 (three) times daily before  meals. Sliding scale         . insulin detemir (LEVEMIR) 100 UNIT/ML injection   Subcutaneous   Inject 0.1 mLs (10 Units total) into the skin at bedtime.   10 mL   11   . ipratropium-albuterol (DUONEB) 0.5-2.5 (3) MG/3ML SOLN   Nebulization   Take 3 mLs by nebulization 2 (two) times daily.   360 mL   0   . isosorbide-hydrALAZINE (BIDIL) 20-37.5 MG per tablet   Oral   Take 1 tablet by mouth 3 (three) times daily. Patient not taking: Reported on 10/30/2014   30 tablet   0   . lisinopril (PRINIVIL,ZESTRIL) 2.5 MG tablet   Oral   Take 1 tablet (2.5 mg total) by mouth daily.   30 tablet   0   . metoprolol (LOPRESSOR) 50 MG tablet   Oral   Take 1 tablet (50 mg total) by mouth 2 (two) times daily.   62 tablet   0   . potassium chloride SA (K-DUR,KLOR-CON) 20 MEQ tablet   Oral   Take 1 tablet (20 mEq total) by mouth daily.   31 tablet   0   .  rosuvastatin (CRESTOR) 10 MG tablet   Oral   Take 10 mg by mouth daily.           Allergies Review of patient's allergies indicates no known allergies.  Family History  Problem Relation Age of Onset  . CAD      Social History Social History  Substance Use Topics  . Smoking status: Former Research scientist (life sciences)  . Smokeless tobacco: Never Used  . Alcohol Use: No    Review of Systems Constitutional: Positive for fever/chills Eyes: No visual changes. ENT: No sore throat. Cardiovascular: Denies chest pain. Respiratory: Positive for cough. Denies shortness of breath. Gastrointestinal: No abdominal pain.  No nausea, no vomiting.  No diarrhea.  No constipation. Genitourinary: Negative for dysuria. Musculoskeletal: Negative for back pain. Skin: Negative for rash. Neurological: Positive for altered mentation. Negative for headaches, focal weakness or numbness.  10-point ROS otherwise negative.  ____________________________________________   PHYSICAL EXAM:  VITAL SIGNS: ED Triage Vitals  Enc Vitals Group     BP 12/21/14 2259 116/99 mmHg     Pulse Rate 12/21/14 2259 118     Resp 12/21/14 2259 16     Temp 12/21/14 2259 98.3 F (36.8 C)     Temp Source 12/21/14 2259 Oral     SpO2 12/21/14 2259 86 %     Weight 12/21/14 2259 210 lb (95.255 kg)     Height 12/21/14 2259 5\' 6"  (1.676 m)     Head Cir --      Peak Flow --      Pain Score --      Pain Loc --      Pain Edu? --      Excl. in [place]? --     Constitutional: Alert and oriented. Ill-appearing and in moderate acute distress. Eyes: Conjunctivae are normal. PERRL. EOMI. Head: Atraumatic. Nose: No congestion/rhinnorhea. Mouth/Throat: Mucous membranes are dry.  Oropharynx non-erythematous. Neck: No stridor.   Cardiovascular: Tachycardic, regular rhythm. Grossly normal heart sounds.  Good peripheral circulation. Respiratory: Mildly increased respiratory effort.  No retractions. Lungs with rhonchi and rales  diffusely. Gastrointestinal: Soft and nontender. No distention. No abdominal bruits. No CVA tenderness. Musculoskeletal: No lower extremity tenderness nor edema.  No joint effusions. Neurologic:  Normal speech and language. No gross focal neurologic deficits are appreciated. Alert and oriented  3 with intermittent confusion. Skin:  Skin is hot, dry and intact. No rash noted. Psychiatric: Mood and affect are normal. Speech and behavior are normal.  ____________________________________________   LABS (all labs ordered are listed, but only abnormal results are displayed)  Labs Reviewed  CBC WITH DIFFERENTIAL/PLATELET - Abnormal; Notable for the following:    WBC 37.7 (*)    Hemoglobin 11.4 (*)    MCHC 31.0 (*)    RDW 16.7 (*)    Platelets 501 (*)    Neutro Abs 34.5 (*)    Monocytes Absolute 1.8 (*)    All other components within normal limits  COMPREHENSIVE METABOLIC PANEL - Abnormal; Notable for the following:    Chloride 98 (*)    Glucose, Bld 175 (*)    BUN 49 (*)    Creatinine, Ser 1.46 (*)    Calcium 8.6 (*)    Albumin 2.9 (*)    ALT 9 (*)    GFR calc non Af Amer 33 (*)    GFR calc Af Amer 39 (*)    All other components within normal limits  LACTIC ACID, PLASMA - Abnormal; Notable for the following:    Lactic Acid, Venous 2.1 (*)    All other components within normal limits  BLOOD GAS, ARTERIAL - Abnormal; Notable for the following:    pH, Arterial 7.31 (*)    pO2, Arterial 117 (*)    Bicarbonate 19.1 (*)    Acid-base deficit 6.6 (*)    Allens test (pass/fail) POSITIVE (*)    All other components within normal limits  CULTURE, BLOOD (ROUTINE X 2)  CULTURE, BLOOD (ROUTINE X 2)  BRAIN NATRIURETIC PEPTIDE  TROPONIN I  LACTIC ACID, PLASMA   ____________________________________________  EKG  ED ECG REPORT I, Lashai Grosch J, the attending physician, personally viewed and interpreted this ECG.   Date: 12/21/2014  EKG Time: 2316  Rate: 122  Rhythm: sinus  tachycardia  Axis: Normal  Intervals:none  ST&T Change: Nonspecific  ____________________________________________  RADIOLOGY  Portable chest x-ray (viewed by me, interpreted per Dr. Radene Knee):  Slightly improved rightward mediastinal shift. Right basilar airspace opacity is better characterized, and raises concern for pneumonia. Suspect small right pleural effusion. Followup PA and lateral chest X-ray is recommended in 3-4 weeks following trial of antibiotic therapy to ensure resolution and exclude underlying Malignancy.  CT head without contrast interpreted per Dr. Pascal Lux: No acute finding or change from February 2016.  CT angiography chest discussed with Dr. Radene Knee: 1. No evidence of pulmonary embolus. 2. Dense right lower lobe consolidation, with fluid and debris filling the right mainstem bronchus and bronchioles to the right lower lobe. Additional fluid and debris extending into the right upper lobe, with underlying patchy airspace opacities, compatible with significant right-sided aspiration pneumonia. Trace underlying right-sided pleural fluid noted. 3. Minimal left-sided atelectasis and scarring seen. 4. Rightward mediastinal shift noted, reflecting right-sided volume loss. 5. Enlarged mediastinal nodes likely reflect the underlying acute infectious process. ____________________________________________   PROCEDURES  Procedure(s) performed: None  Critical Care performed: Yes, see critical care note(s)  CRITICAL CARE Performed by: Paulette Blanch   Total critical care time: 45 minutes   Critical care time was exclusive of separately billable procedures and treating other patients.  Critical care was necessary to treat or prevent imminent or life-threatening deterioration.  Critical care was time spent personally by me on the following activities: development of treatment plan with patient and/or surrogate as well as nursing, discussions with consultants, evaluation  of patient's  response to treatment, examination of patient, obtaining history from patient or surrogate, ordering and performing treatments and interventions, ordering and review of laboratory studies, ordering and review of radiographic studies, pulse oximetry and re-evaluation of patient's condition. ____________________________________________   INITIAL IMPRESSION / ASSESSMENT AND PLAN / ED COURSE  Pertinent labs & imaging results that were available during my care of the patient were reviewed by me and considered in my medical decision making (see chart for details).  80 year old female who presents from home with altered mentation, fever and cough. Symptoms concerning for community acquired pneumonia. Code sepsis initiated promptly.   ----------------------------------------- 23:47 PM on 12/22/2014 -----------------------------------------  Viewed bedside portable chest x-ray which shows questionable situs inversus. Chart review reveals that patient was admitted for similar sepsis presentation with bilateral pneumonia in June 2016; at that time she did not have evidence of situs inversus. Will repeat portable chest x-ray.  ----------------------------------------- 00:10 AM on 12/22/2014 -----------------------------------------  Repeat portable CXR similar in appearance to prior. Will proceed with CT chest to evaluate mass/malignancy causing shift of patient's heart towards her right chest.  ----------------------------------------- 1:46 AM on 12/22/2014 -----------------------------------------  Discussed CT chest findings with Dr. Radene Knee of severe right sided aspiration pneumonia. Will add IV clindamycin. Discussed with hospitalist to evaluate patient in the emergency department for admission.  ____________________________________________   FINAL CLINICAL IMPRESSION(S) / ED DIAGNOSES  Final diagnoses:  Sepsis, due to unspecified organism  Community acquired pneumonia   Hypoxia  Aspiration pneumonia, unspecified aspiration pneumonia type      Paulette Blanch, MD 12/22/14 [phone]

## 2014-12-22 ENCOUNTER — Emergency Department: Payer: Medicare Other

## 2014-12-22 ENCOUNTER — Encounter: Payer: Self-pay | Admitting: Radiology

## 2014-12-22 DIAGNOSIS — I10 Essential (primary) hypertension: Secondary | ICD-10-CM | POA: Diagnosis present

## 2014-12-22 DIAGNOSIS — E876 Hypokalemia: Secondary | ICD-10-CM | POA: Diagnosis present

## 2014-12-22 DIAGNOSIS — R0689 Other abnormalities of breathing: Secondary | ICD-10-CM | POA: Diagnosis present

## 2014-12-22 DIAGNOSIS — I2510 Atherosclerotic heart disease of native coronary artery without angina pectoris: Secondary | ICD-10-CM | POA: Diagnosis present

## 2014-12-22 DIAGNOSIS — Z8249 Family history of ischemic heart disease and other diseases of the circulatory system: Secondary | ICD-10-CM | POA: Diagnosis not present

## 2014-12-22 DIAGNOSIS — Z66 Do not resuscitate: Secondary | ICD-10-CM | POA: Diagnosis present

## 2014-12-22 DIAGNOSIS — J449 Chronic obstructive pulmonary disease, unspecified: Secondary | ICD-10-CM | POA: Diagnosis present

## 2014-12-22 DIAGNOSIS — J9601 Acute respiratory failure with hypoxia: Secondary | ICD-10-CM | POA: Diagnosis present

## 2014-12-22 DIAGNOSIS — G9341 Metabolic encephalopathy: Secondary | ICD-10-CM | POA: Diagnosis present

## 2014-12-22 DIAGNOSIS — Z794 Long term (current) use of insulin: Secondary | ICD-10-CM | POA: Diagnosis not present

## 2014-12-22 DIAGNOSIS — A419 Sepsis, unspecified organism: Secondary | ICD-10-CM | POA: Diagnosis present

## 2014-12-22 DIAGNOSIS — L89899 Pressure ulcer of other site, unspecified stage: Secondary | ICD-10-CM | POA: Diagnosis present

## 2014-12-22 DIAGNOSIS — Z7982 Long term (current) use of aspirin: Secondary | ICD-10-CM | POA: Diagnosis not present

## 2014-12-22 DIAGNOSIS — L309 Dermatitis, unspecified: Secondary | ICD-10-CM | POA: Diagnosis present

## 2014-12-22 DIAGNOSIS — Z87891 Personal history of nicotine dependence: Secondary | ICD-10-CM | POA: Diagnosis not present

## 2014-12-22 DIAGNOSIS — J189 Pneumonia, unspecified organism: Secondary | ICD-10-CM | POA: Diagnosis present

## 2014-12-22 DIAGNOSIS — Z9119 Patient's noncompliance with other medical treatment and regimen: Secondary | ICD-10-CM | POA: Diagnosis present

## 2014-12-22 DIAGNOSIS — E109 Type 1 diabetes mellitus without complications: Secondary | ICD-10-CM | POA: Diagnosis present

## 2014-12-22 DIAGNOSIS — J690 Pneumonitis due to inhalation of food and vomit: Secondary | ICD-10-CM | POA: Diagnosis present

## 2014-12-22 DIAGNOSIS — Z8673 Personal history of transient ischemic attack (TIA), and cerebral infarction without residual deficits: Secondary | ICD-10-CM | POA: Diagnosis not present

## 2014-12-22 DIAGNOSIS — Z96653 Presence of artificial knee joint, bilateral: Secondary | ICD-10-CM | POA: Diagnosis present

## 2014-12-22 LAB — GLUCOSE, CAPILLARY
GLUCOSE-CAPILLARY: 169 mg/dL — AB (ref 65–99)
GLUCOSE-CAPILLARY: 96 mg/dL (ref 65–99)
GLUCOSE-CAPILLARY: 91 mg/dL (ref 65–99)
Glucose-Capillary: 120 mg/dL — ABNORMAL HIGH (ref 65–99)

## 2014-12-22 LAB — URINALYSIS COMPLETE WITH MICROSCOPIC (ARMC ONLY)
Bilirubin Urine: NEGATIVE
Glucose, UA: NEGATIVE mg/dL
Hgb urine dipstick: NEGATIVE
Ketones, ur: NEGATIVE mg/dL
Nitrite: NEGATIVE
PROTEIN: 30 mg/dL — AB
Specific Gravity, Urine: 1.017 (ref 1.005–1.030)
pH: 5 (ref 5.0–8.0)

## 2014-12-22 LAB — BLOOD GAS, ARTERIAL
ACID-BASE DEFICIT: 6.6 mmol/L — AB (ref 0.0–2.0)
ACID-BASE DEFICIT: 1.1 mmol/L (ref 0.0–2.0)
ALLENS TEST (PASS/FAIL): POSITIVE — AB
Allens test (pass/fail): POSITIVE — AB
Bicarbonate: 19.1 mEq/L — ABNORMAL LOW (ref 21.0–28.0)
Bicarbonate: 23.5 mEq/L (ref 21.0–28.0)
FIO2: 1
FIO2: 0.55
O2 SAT: 98.2 %
O2 SAT: 94.5 %
PATIENT TEMPERATURE: 37
PCO2 ART: 38 mmHg (ref 32.0–48.0)
PO2 ART: 73 mmHg — AB (ref 83.0–108.0)
Patient temperature: 37
pCO2 arterial: 38 mmHg (ref 32.0–48.0)
pH, Arterial: 7.31 — ABNORMAL LOW (ref 7.350–7.450)
pH, Arterial: 7.4 (ref 7.350–7.450)
pO2, Arterial: 117 mmHg — ABNORMAL HIGH (ref 83.0–108.0)

## 2014-12-22 LAB — COMPREHENSIVE METABOLIC PANEL
ALBUMIN: 2.9 g/dL — AB (ref 3.5–5.0)
ALT: 9 U/L — ABNORMAL LOW (ref 14–54)
AST: 20 U/L (ref 15–41)
Alkaline Phosphatase: 60 U/L (ref 38–126)
Anion gap: 15 (ref 5–15)
BILIRUBIN TOTAL: 1.1 mg/dL (ref 0.3–1.2)
BUN: 49 mg/dL — AB (ref 6–20)
CHLORIDE: 98 mmol/L — AB (ref 101–111)
CO2: 23 mmol/L (ref 22–32)
Calcium: 8.6 mg/dL — ABNORMAL LOW (ref 8.9–10.3)
Creatinine, Ser: 1.46 mg/dL — ABNORMAL HIGH (ref 0.44–1.00)
GFR calc Af Amer: 39 mL/min — ABNORMAL LOW (ref >60)
GFR calc non Af Amer: 33 mL/min — ABNORMAL LOW (ref >60)
GLUCOSE: 175 mg/dL — AB (ref 65–99)
POTASSIUM: 4.7 mmol/L (ref 3.5–5.1)
SODIUM: 136 mmol/L (ref 135–145)
Total Protein: 7.4 g/dL (ref 6.5–8.1)

## 2014-12-22 LAB — LACTIC ACID, PLASMA
LACTIC ACID, VENOUS: 1.5 mmol/L (ref 0.5–2.0)
Lactic Acid, Venous: 2.1 mmol/L (ref 0.5–2.0)

## 2014-12-22 LAB — TROPONIN I: Troponin I: <0.03 ng/mL (ref <0.031)

## 2014-12-22 LAB — BRAIN NATRIURETIC PEPTIDE: B Natriuretic Peptide: 60 pg/mL (ref 0.0–100.0)

## 2014-12-22 MED ORDER — IPRATROPIUM-ALBUTEROL 0.5-2.5 (3) MG/3ML IN SOLN: 3.0000 mL | RESPIRATORY_TRACT | Status: DC | PRN

## 2014-12-22 MED ORDER — ONDANSETRON HCL 4 MG PO TABS: 4.0000 mg | ORAL_TABLET | Freq: Four times a day (QID) | ORAL | Status: DC | PRN

## 2014-12-22 MED ORDER — FLUTICASONE PROPIONATE 50 MCG/ACT NA SUSP
1.0000 | Freq: Every day | NASAL | Status: DC
  Administered 2014-12-22 – 2014-12-28 (×6): 1 via NASAL
  Filled 2014-12-22: qty 16

## 2014-12-22 MED ORDER — ONDANSETRON HCL 4 MG/2ML IJ SOLN: 4.0000 mg | Freq: Four times a day (QID) | INTRAMUSCULAR | Status: DC | PRN

## 2014-12-22 MED ORDER — MIRTAZAPINE 15 MG PO TABS
15.0000 mg | ORAL_TABLET | Freq: Every day | ORAL | Status: DC
  Administered 2014-12-23 – 2014-12-27 (×5): 15 mg via ORAL
  Filled 2014-12-22 (×5): qty 1

## 2014-12-22 MED ORDER — VANCOMYCIN HCL IN DEXTROSE 1-5 GM/200ML-% IV SOLN
1000.0000 mg | Freq: Once | INTRAVENOUS | Status: AC
  Administered 2014-12-22: 1000 mg via INTRAVENOUS
  Filled 2014-12-22: qty 200

## 2014-12-22 MED ORDER — LISINOPRIL 5 MG PO TABS
2.5000 mg | ORAL_TABLET | Freq: Every day | ORAL | Status: DC
  Administered 2014-12-22 – 2014-12-26 (×4): 2.5 mg via ORAL
  Filled 2014-12-22 (×4): qty 1

## 2014-12-22 MED ORDER — CHLORHEXIDINE GLUCONATE 0.12 % MT SOLN
15.0000 mL | Freq: Two times a day (BID) | OROMUCOSAL | Status: DC
  Administered 2014-12-23 – 2014-12-28 (×8): 15 mL via OROMUCOSAL

## 2014-12-22 MED ORDER — CLOPIDOGREL BISULFATE 75 MG PO TABS
75.0000 mg | ORAL_TABLET | Freq: Every day | ORAL | Status: DC
  Administered 2014-12-22 – 2014-12-28 (×6): 75 mg via ORAL
  Filled 2014-12-22 (×6): qty 1

## 2014-12-22 MED ORDER — POLYETHYLENE GLYCOL 3350 17 G PO PACK: 17.0000 g | PACK | Freq: Every day | ORAL | Status: DC | PRN

## 2014-12-22 MED ORDER — OXYCODONE HCL 5 MG PO TABS
5.0000 mg | ORAL_TABLET | ORAL | Status: DC | PRN
  Administered 2014-12-24 – 2014-12-27 (×3): 5 mg via ORAL
  Filled 2014-12-22 (×3): qty 1

## 2014-12-22 MED ORDER — IOHEXOL 350 MG/ML SOLN
80.0000 mL | Freq: Once | INTRAVENOUS | Status: AC | PRN
  Administered 2014-12-22: 80 mL via INTRAVENOUS

## 2014-12-22 MED ORDER — CETYLPYRIDINIUM CHLORIDE 0.05 % MT LIQD
7.0000 mL | Freq: Two times a day (BID) | OROMUCOSAL | Status: DC
  Administered 2014-12-22 – 2014-12-27 (×7): 7 mL via OROMUCOSAL

## 2014-12-22 MED ORDER — INSULIN ASPART 100 UNIT/ML ~~LOC~~ SOLN: 0.0000 [IU] | Freq: Every day | SUBCUTANEOUS | Status: DC

## 2014-12-22 MED ORDER — SODIUM CHLORIDE 0.9 % IV SOLN
INTRAVENOUS | Status: DC
  Administered 2014-12-22: 04:00:00 via INTRAVENOUS

## 2014-12-22 MED ORDER — ROSUVASTATIN CALCIUM 10 MG PO TABS
10.0000 mg | ORAL_TABLET | Freq: Every day | ORAL | Status: DC
  Administered 2014-12-23 – 2014-12-27 (×5): 10 mg via ORAL
  Filled 2014-12-22 (×5): qty 1

## 2014-12-22 MED ORDER — VANCOMYCIN HCL 10 G IV SOLR
1500.0000 mg | INTRAVENOUS | Status: DC
  Administered 2014-12-22 – 2014-12-24 (×3): 1500 mg via INTRAVENOUS
  Filled 2014-12-22 (×4): qty 1500

## 2014-12-22 MED ORDER — ACETAMINOPHEN 650 MG RE SUPP: 650.0000 mg | Freq: Four times a day (QID) | RECTAL | Status: DC | PRN

## 2014-12-22 MED ORDER — PIPERACILLIN-TAZOBACTAM 3.375 G IVPB
3.3750 g | Freq: Three times a day (TID) | INTRAVENOUS | Status: DC
  Administered 2014-12-22 – 2014-12-28 (×20): 3.375 g via INTRAVENOUS
  Filled 2014-12-22 (×23): qty 50

## 2014-12-22 MED ORDER — GLUCERNA SHAKE PO LIQD
237.0000 mL | Freq: Two times a day (BID) | ORAL | Status: DC
  Administered 2014-12-23 – 2014-12-24 (×3): 237 mL via ORAL

## 2014-12-22 MED ORDER — HEPARIN SODIUM (PORCINE) 5000 UNIT/ML IJ SOLN
5000.0000 [IU] | Freq: Three times a day (TID) | INTRAMUSCULAR | Status: DC
  Administered 2014-12-22 – 2014-12-28 (×19): 5000 [IU] via SUBCUTANEOUS
  Filled 2014-12-22 (×19): qty 1

## 2014-12-22 MED ORDER — DOCUSATE SODIUM 100 MG PO CAPS
100.0000 mg | ORAL_CAPSULE | Freq: Two times a day (BID) | ORAL | Status: DC
  Administered 2014-12-23 – 2014-12-28 (×5): 100 mg via ORAL
  Filled 2014-12-22 (×6): qty 1

## 2014-12-22 MED ORDER — METOPROLOL TARTRATE 50 MG PO TABS
50.0000 mg | ORAL_TABLET | Freq: Every day | ORAL | Status: DC
  Administered 2014-12-22 – 2014-12-28 (×6): 50 mg via ORAL
  Filled 2014-12-22 (×6): qty 1

## 2014-12-22 MED ORDER — ACETAMINOPHEN 325 MG PO TABS
650.0000 mg | ORAL_TABLET | Freq: Four times a day (QID) | ORAL | Status: DC | PRN
  Administered 2014-12-24 – 2014-12-25 (×3): 650 mg via ORAL
  Filled 2014-12-22 (×3): qty 2

## 2014-12-22 MED ORDER — CLINDAMYCIN PHOSPHATE 600 MG/50ML IV SOLN: 600.0000 mg | Freq: Once | INTRAVENOUS | Status: DC

## 2014-12-22 MED ORDER — MORPHINE SULFATE (PF) 2 MG/ML IV SOLN
2.0000 mg | INTRAVENOUS | Status: DC | PRN
  Administered 2014-12-23 (×2): 2 mg via INTRAVENOUS
  Filled 2014-12-22 (×2): qty 1

## 2014-12-22 MED ORDER — INSULIN ASPART 100 UNIT/ML ~~LOC~~ SOLN
0.0000 [IU] | Freq: Three times a day (TID) | SUBCUTANEOUS | Status: DC
  Administered 2014-12-22: 3 [IU] via SUBCUTANEOUS
  Administered 2014-12-24 – 2014-12-28 (×3): 2 [IU] via SUBCUTANEOUS
  Filled 2014-12-22: qty 8
  Filled 2014-12-22: qty 3
  Filled 2014-12-22 (×3): qty 2

## 2014-12-22 MED ORDER — INSULIN DETEMIR 100 UNIT/ML ~~LOC~~ SOLN
10.0000 [IU] | Freq: Every day | SUBCUTANEOUS | Status: DC
  Administered 2014-12-24: 10 [IU] via SUBCUTANEOUS
  Filled 2014-12-22 (×5): qty 0.1

## 2014-12-22 MED ORDER — PIPERACILLIN-TAZOBACTAM 3.375 G IVPB: 3.3750 g | Freq: Once | INTRAVENOUS | Status: DC

## 2014-12-22 NOTE — Progress Notes (Signed)
ANTIBIOTIC CONSULT NOTE - INITIAL  Pharmacy Consult for Vancomycin/Zosyn Indication: pneumonia  No Known Allergies  Patient Measurements: Height: 5\' 6"  (167.6 cm) Weight: 210 lb (95.255 kg) IBW/kg (Calculated) : 59.3 Adjusted Body Weight: 73.7 kg  Vital Signs: Temp: 99.2 F (37.3 C) (08/17 0120) Temp Source: Rectal (08/17 0120) BP: 103/53 mmHg (08/17 0239) Pulse Rate: 94 (08/17 0239) Intake/Output from previous day:   Intake/Output from this shift:    Labs:  Recent Labs  12/21/14 2258  WBC 37.7*  HGB 11.4*  PLT 501*  CREATININE 1.46*   Estimated Creatinine Clearance: 37.5 mL/min (by C-G formula based on Cr of 1.46). No results for input(s): VANCOTROUGH, VANCOPEAK, VANCORANDOM, GENTTROUGH, GENTPEAK, GENTRANDOM, TOBRATROUGH, TOBRAPEAK, TOBRARND, AMIKACINPEAK, AMIKACINTROU, AMIKACIN in the last 72 hours.   Microbiology: No results found for this or any previous visit (from the past 720 hour(s)).  Medical History: Past Medical History  Diagnosis Date  . Stroke   . COPD (chronic obstructive pulmonary disease)   . Coronary artery disease   . Hypertension   . Diabetes mellitus type 1     Medications:  Infusions:  . sodium chloride    . piperacillin-tazobactam (ZOSYN)  IV    . vancomycin 1,000 mg (12/22/14 0244)   Assessment: 30 yof with AMS and cough originally ordered CTX and azithro in ED now provider broadening spectrum to cover HCAP.  Goal of Therapy:  Vancomycin trough level 15-20 mcg/ml CrCl 38 mL/min, Vd 66.7 L, Ke 0.035 hr-1, T1/2 19.5 hr.  Plan:  Expected duration 7 days with resolution of temperature and/or normalization of WBC  Received 1 gram vancomycin in ED. Kinetic based dosing should be 1.5 gm IV Q24H, will start with stacked dosing 6 hours after first dose. Zosyn 3.375 gm IV Q8H EI. Will order vancomycin trough before 4th dose.   Laural Benes, Pharm.D. Clinical Pharmacist 12/22/2014,3:01 AM

## 2014-12-22 NOTE — Progress Notes (Signed)
A&O with some confusion to time and situation. Admitted from home. Bedbound. On NRB. On home O2 at 3L. IV fluids infusing. IV antibiotics given. Resting quietly.

## 2014-12-22 NOTE — Progress Notes (Signed)
[place] [facility] [place] NAME: Brenda Hogan    MR#:  [id number]  DATE OF BIRTH:  1934-05-24  SUBJECTIVE:  CHIEF COMPLAINT:   Chief Complaint  Patient presents with  . Altered Mental Status   Patient here due to altered mental status and also acute respiratory failure and noted to have a aspiration pneumonia. Remains hypoxic and is on a high flow nasal cannula. Mental status a bit improved. Seen by speech and plan to make patient nothing by mouth given her CT scan findings  REVIEW OF SYSTEMS:    Review of Systems  Constitutional: Negative for fever and chills.  HENT: Negative for congestion and tinnitus.   Eyes: Negative for blurred vision and double vision.  Respiratory: Positive for cough, sputum production and shortness of breath. Negative for wheezing.   Cardiovascular: Negative for chest pain, orthopnea and PND.  Gastrointestinal: Negative for nausea, vomiting, abdominal pain and diarrhea.  Genitourinary: Negative for dysuria and hematuria.  Neurological: Positive for weakness (generalized). Negative for dizziness, sensory change and focal weakness.  All other systems reviewed and are negative.   Nutrition: NPO Tolerating Diet: NO Tolerating PT:  Await Eval  DRUG ALLERGIES:  No Known Allergies  VITALS:  Blood pressure 99/62, pulse 73, temperature 98.3 F (36.8 C), temperature source Axillary, resp. rate 22, height 5' 6"  (1.676 m), weight 62.46 kg (137 lb 11.2 oz), SpO2 98 %.  PHYSICAL EXAMINATION:   Physical Exam  GENERAL:  80 y.o.-year-old patient lying in the bed in mild to moderate resp. distress.  EYES: Pupils equal, round, reactive to light and accommodation. No scleral icterus. Extraocular muscles intact.  HEENT: Head atraumatic, normocephalic. Oropharynx and nasopharynx clear.  NECK:  Supple, no jugular venous distention. No thyroid enlargement, no tenderness.  LUNGS: + use of accessory muscles.  Rhonchi,  crackles on right lung fields.  CARDIOVASCULAR: S1, S2 normal. No murmurs, rubs, or gallops.  ABDOMEN: Soft, nontender, nondistended. Bowel sounds present. No organomegaly or mass.  EXTREMITIES: No cyanosis, clubbing or edema b/l.    NEUROLOGIC: Cranial nerves II through XII are intact. No focal Motor or sensory deficits b/l.  Globally weak PSYCHIATRIC: The patient is alert and oriented x 3. Good affect. SKIN: No obvious rash, lesion, or ulcer.    LABORATORY PANEL:   CBC  Recent Labs Lab 12/21/14 2258  WBC 37.7*  HGB 11.4*  HCT 36.7  PLT 501*   ------------------------------------------------------------------------------------------------------------------  Chemistries   Recent Labs Lab 12/21/14 2258  NA 136  K 4.7  CL 98*  CO2 23  GLUCOSE 175*  BUN 49*  CREATININE 1.46*  CALCIUM 8.6*  AST 20  ALT 9*  ALKPHOS 60  BILITOT 1.1   ------------------------------------------------------------------------------------------------------------------  Cardiac Enzymes  Recent Labs Lab 12/21/14 2258  TROPONINI <0.03   ------------------------------------------------------------------------------------------------------------------  RADIOLOGY:  Ct Head Wo Contrast  12/22/2014   CLINICAL DATA:  Altered mental status beginning today.  EXAM: CT HEAD WITHOUT CONTRAST  TECHNIQUE: Contiguous axial images were obtained from the base of the skull through the vertex without intravenous contrast.  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 No fracture or destructive process.  Chronic opacification of a posterior right ethmoid air cell without expansion.  Orbits: No acute abnormality.  Brain: No evidence of acute infarction, hemorrhage, hydrocephalus, or mass lesion/mass effect.  Atrophy with ventriculomegaly which is stable from prior and only mildly progressed from 2014. Confluent low-density in the periventricular white matter  bifrontally consistent with chronic small vessel disease. There have been remote perforator infarcts in the left corona radiata and right anterior internal capsule.  IMPRESSION: No acute finding or change from February 2016.   Electronically Signed   By: Monte Fantasia M.D.   On: 12/22/2014 01:33   Ct Angio Chest Pe W/cm &/or Wo Cm  12/22/2014   CLINICAL DATA:  Acute onset of altered mental status. Cough. Initial encounter.  EXAM: CT ANGIOGRAPHY CHEST WITH CONTRAST  TECHNIQUE: Multidetector CT imaging of the chest was performed using the standard protocol during bolus administration of intravenous contrast. Multiplanar CT image reconstructions and MIPs were obtained to evaluate the vascular anatomy.  CONTRAST:  26m OMNIPAQUE IOHEXOL 350 MG/ML SOLN  COMPARISON:  Chest radiograph performed 10/21/2014, and CT of the chest performed 06/17/2014  FINDINGS: There is no evidence of pulmonary embolus.  There is dense consolidation of the right lower lobe, with fluid and debris filling the right mainstem bronchus and bronchioles to the right lower lobe. Additional fluid and debris are seen extending into the right upper lobe, with underlying patchy airspace opacities, compatible with significant aspiration pneumonia. Trace underlying right-sided pleural fluid is noted. Minimal left-sided atelectasis and scarring are seen. There is no evidence of pneumothorax. No masses are identified; no abnormal focal contrast enhancement is seen.  There is rightward mediastinal shift, as previously noted, reflecting right-sided volume loss. A 1.3 cm right paratracheal node likely reflects the underlying acute infectious process. A likely enlarged 1.3 cm subcarinal node is seen, with adjacent edema. No pericardial effusion is identified. Scattered calcification is noted along the aortic arch and proximal great vessels. No axillary lymphadenopathy is seen. The visualized portions of the thyroid gland are unremarkable in appearance.  The  visualized portions of the liver and spleen are unremarkable. The visualized portions of the stomach, adrenal glands and kidneys are within normal limits.  No acute osseous abnormalities are seen.  Review of the MIP images confirms the above findings.  IMPRESSION: 1. No evidence of pulmonary embolus. 2. Dense right lower lobe consolidation, with fluid and debris filling the right mainstem bronchus and bronchioles to the right lower lobe. Additional fluid and debris extending into the right upper lobe, with underlying patchy airspace opacities, compatible with significant right-sided aspiration pneumonia. Trace underlying right-sided pleural fluid noted. 3. Minimal left-sided atelectasis and scarring seen. 4. Rightward mediastinal shift noted, reflecting right-sided volume loss. 5. Enlarged mediastinal nodes likely reflect the underlying acute infectious process. These results were called by telephone at the time of interpretation on 12/22/2014 at 1:44 am to Dr. JLurline Hare who verbally acknowledged these results.   Electronically Signed   By: JGarald BaldingM.D.   On: 12/22/2014 01:45   Dg Chest Port 1 View  12/22/2014   CLINICAL DATA:  Repeat chest radiograph requested. Subsequent encounter.  EXAM: PORTABLE CHEST - 1 VIEW  COMPARISON:  Chest radiograph performed earlier today at 11:20 p.m.  FINDINGS: There is slightly improved rightward mediastinal shift. Right basilar airspace opacity is better characterized, and raises concern for pneumonia. A small right pleural effusion is suspected. No pneumothorax is seen. The left lung appears clear.  The cardiomediastinal silhouette is borderline normal in size. No acute osseous abnormalities are identified.  IMPRESSION: Slightly improved rightward mediastinal shift. Right basilar airspace opacity is better characterized, and raises concern for pneumonia. Suspect small right pleural effusion. Followup PA and lateral chest X-ray is recommended in 3-4 weeks following trial  of antibiotic therapy to ensure resolution and  exclude underlying malignancy.   Electronically Signed   By: Garald Balding M.D.   On: 12/22/2014 00:35   Dg Chest Port 1 View  12/22/2014   CLINICAL DATA:  Acute onset of confusion.  Initial encounter.  EXAM: PORTABLE CHEST - 1 VIEW  COMPARISON:  Chest radiograph performed 11/01/2014  FINDINGS: There is rightward displacement of the mediastinum, which appears to reflect right-sided volume loss. Underlying right basilar pneumonia cannot be excluded. The left lung appears clear. No definite pleural effusion or pneumothorax is seen.  The cardiomediastinal silhouette is borderline normal in size. Prominence of the right paratracheal soft tissues is relatively stable, and may reflect normal vasculature. No acute osseous abnormalities are identified.  IMPRESSION: Rightward displacement of the mediastinum, which appears to reflect right-sided volume loss. Underlying right basilar pneumonia cannot be excluded. Would correlate for associated symptoms.  A central obstructing mass cannot be entirely excluded. Followup PA and lateral chest X-ray is recommended in 3-4 weeks following treatment, to ensure resolution and exclude underlying malignancy.   Electronically Signed   By: Garald Balding M.D.   On: 12/22/2014 00:19     ASSESSMENT AND PLAN:   80 year old female with past medical history of previous CVA, COPD, hypertension, diabetes, history of coronary disease, who presented to the hospital due to altered mental status and also noted to have a pneumonia.  #1 altered mental status-this is likely metabolic encephalopathy due to the pneumonia. -Patient's CT head was negative for any acute pathology. Mental status is improved and will continue to monitor.  #2 sepsis-patient met criteria on admission with leukocytosis, tachycardia, tachypnea and  positive chest x-ray findings suggestive of pneumonia. -This is likely aspiration pneumonia. Continue broad-spectrum IV  antibiotics with vancomycin and Zosyn and follow blood and sputum cultures.  #3 pneumonia-likely aspiration pneumonia given her CT scan findings. Continue IV vancomycin, Zosyn. -Seen by speech therapy and plan to keep patient nothing by mouth given her tenuous respiratory status.  #4 acute respiratory failure with hypoxia-this is likely secondary to pneumonia. -Continue O2 supplementation with high flow nasal cannula. Continue treatment of pneumonia as mentioned above.  #5 hypertension-continue lisinopril, metoprolol.  #6 diabetes-continue Levemir, sliding scale insulin.  #7 history of previous CVA-continue Plavix, statin.  #8 leukocytosis-likely due to infection and will continue to monitor with IV antibiotic therapy. -Patient seems to have a persistent leukocytosis in the past and may benefit from outpatient hematology/oncology evaluation.  All the records are reviewed and case discussed with Care Management/Social Workerr. Management plans discussed with the patient, family and they are in agreement.  CODE STATUS: DO NOT RESUSCITATE  DVT Prophylaxis: Heparin subcutaneous  TOTAL TIME TAKING CARE OF THIS PATIENT: 30 minutes.   POSSIBLE D/C IN 2-3 DAYS, DEPENDING ON CLINICAL CONDITION.   Henreitta Leber M.D on 12/22/2014 at 4:47 PM  Between 7am to 6pm - Pager - [phone]  After 6pm go to [email or web address] - password [email or web address] [facility]  [facility]  Office  [phone]  CC: Primary care physician; Alvester Chou, NP

## 2014-12-22 NOTE — Progress Notes (Signed)
ANTIBIOTIC CONSULT NOTE - INITIAL  Pharmacy Consult for Azithromycin/Ceftriaxone Indication: pneumonia  No Known Allergies  Patient Measurements: Height: 5\' 6"  (167.6 cm) Weight: 210 lb (95.255 kg) IBW/kg (Calculated) : 59.3 Adjusted Body Weight: 73.6 kg  Vital Signs: Temp: 98.3 F (36.8 C) (08/16 2259) Temp Source: Oral (08/16 2259) BP: 116/99 mmHg (08/16 2259) Pulse Rate: 118 (08/16 2259) Intake/Output from previous day:   Intake/Output from this shift:    Labs:  Recent Labs  12/21/14 2258  WBC 37.7*  HGB 11.4*  PLT 501*  CREATININE 1.46*   Estimated Creatinine Clearance: 37.5 mL/min (by C-G formula based on Cr of 1.46). No results for input(s): VANCOTROUGH, VANCOPEAK, VANCORANDOM, GENTTROUGH, GENTPEAK, GENTRANDOM, TOBRATROUGH, TOBRAPEAK, TOBRARND, AMIKACINPEAK, AMIKACINTROU, AMIKACIN in the last 72 hours.   Microbiology: No results found for this or any previous visit (from the past 720 hour(s)).  Medical History: Past Medical History  Diagnosis Date  . Stroke   . COPD (chronic obstructive pulmonary disease)   . Coronary artery disease   . Hypertension   . Diabetes mellitus type 1     Medications:  Infusions:  . azithromycin 500 mg (12/22/14 0010)  . cefTRIAXone (ROCEPHIN) IVPB 2 gram/50 mL D5W (Pyxis)    . sodium chloride 1,000 mL (12/21/14 2355)   Assessment: 79 yof cc AMS per famiy. Cough on arrival, limited information so far, consulted for CAP abx.   Goal of Therapy:  Resolution/return to baseline of mental function, eradicate infection if found.  Plan:  Expected duration 5 days with resolution of temperature and/or normalization of WBC. Started azithromycin 500 mg IV Q24H and ceftriaxone 2 gm IV Q24H.   Laural Benes, Pharm.D. Clinical Pharmacist 12/22/2014,1:02 AM

## 2014-12-22 NOTE — Progress Notes (Signed)
Speech Therapy Note: received order, reviewed chart notes and CT imaging notes indicating dense, R lobe consolidation w/ fluid and debris in the R mainstem bronchus. Pt's respiratory is declined this PM and pt is on a NRB for O2 support. MD agreed w/ making pt NPO at this time d/t increased concern/risk for aspiration w/ po meals. ST services will f/u tomorrow. NSG updated. Rec. Oral care w/ aspiration precautions.

## 2014-12-22 NOTE — Care Management (Signed)
Patient presents from home.  Says that lives with her mother and father but that they died.  She then says that her grandson lives with her.  Has home 02 but does not know the name of the company.has a Product manager.  She denies issues obtaining her medicines and denies ssues with transportation to get to MD appointments.   Found that she is currently being followed by [facility].    Currently she is experiencing much respiratory difficulty and is on non rebreather.

## 2014-12-22 NOTE — H&P (Signed)
[facility] NAME: Brenda Hogan    MR#:  [id number]  DATE OF BIRTH:  03-12-1935   DATE OF ADMISSION:  12/21/2014  PRIMARY CARE PHYSICIAN: Alvester Chou, NP   REQUESTING/REFERRING PHYSICIAN: Lurline Hare  CHIEF COMPLAINT:   Chief Complaint  Patient presents with  . Altered Mental Status    HISTORY OF PRESENT ILLNESS:  Brenda Hogan  is a 80 y.o. female with a known history of COPD non-oxygen dependent, type 2 diabetes uncomplicated, hypertension, CVA presenting with cough and altered mental status. The patient's family states that she has been progressively weak/lethargic for some time now but it was most prominent today. With the concern about her confusion and drowsiness they decided to present to Hospital further workup and evaluation. Of note she has been complaining of cough for approximately 2 weeks started on antibiotics as an outpatient without improvement. Cough is nonproductive, no fever, positive chills. She is noncompliant with her diet of nectar thickened liquids  PAST MEDICAL HISTORY:   Past Medical History  Diagnosis Date  . Stroke   . COPD (chronic obstructive pulmonary disease)   . Coronary artery disease   . Hypertension   . Diabetes mellitus type 1     PAST SURGICAL HISTORY:   Past Surgical History  Procedure Laterality Date  . Total knee arthroplasty Bilateral     SOCIAL HISTORY:   Social History  Substance Use Topics  . Smoking status: Former Research scientist (life sciences)  . Smokeless tobacco: Never Used  . Alcohol Use: No    FAMILY HISTORY:   Family History  Problem Relation Age of Onset  . CAD      DRUG ALLERGIES:  No Known Allergies  REVIEW OF SYSTEMS:  REVIEW OF SYSTEMS:  CONSTITUTIONAL: Denies fevers, positive chills, fatigue, weakness.  EYES: Denies blurred vision, double vision, or eye pain.  EARS, NOSE, THROAT: Denies tinnitus, ear pain, hearing loss.  RESPIRATORY: Positive cough,  shortness of breath, denies wheezing  CARDIOVASCULAR: Denies chest pain, palpitations, edema.  GASTROINTESTINAL: Denies nausea, vomiting, diarrhea, abdominal pain.  GENITOURINARY: Denies dysuria, hematuria.  ENDOCRINE: Denies nocturia or thyroid problems. HEMATOLOGIC AND LYMPHATIC: Denies easy bruising or bleeding.  SKIN: Denies rash or lesions.  MUSCULOSKELETAL: Denies pain in neck, back, shoulder, knees, hips, or further arthritic symptoms.  NEUROLOGIC: Denies paralysis, paresthesias.  PSYCHIATRIC: Denies anxiety or depressive symptoms. Otherwise full review of systems performed by me is negative.   MEDICATIONS AT HOME:   Prior to Admission medications   Medication Sig Start Date End Date Taking? Authorizing Provider  albuterol (PROVENTIL HFA;VENTOLIN HFA) 108 (90 BASE) MCG/ACT inhaler Inhale 2 puffs into the lungs every 6 (six) hours as needed for wheezing or shortness of breath. 2 puffs 3 times daily x 3 days then use as needed. 05/12/13  Yes Eugenie Filler, MD  clopidogrel (PLAVIX) 75 MG tablet Take 75 mg by mouth daily. 10/15/14  Yes Historical Provider, MD  feeding supplement, GLUCERNA SHAKE, (GLUCERNA SHAKE) LIQD Take 237 mLs by mouth 2 (two) times daily between meals. 05/12/13  Yes Eugenie Filler, MD  furosemide (LASIX) 40 MG tablet Take 40 mg by mouth every other day.   Yes Historical Provider, MD  insulin detemir (LEVEMIR) 100 UNIT/ML injection Inject 0.1 mLs (10 Units total) into the skin at bedtime. 11/03/14  Yes Fritzi Mandes, MD  ipratropium-albuterol (DUONEB) 0.5-2.5 (3) MG/3ML SOLN Take 3 mLs by nebulization 2 (two) times daily. 05/12/13  Yes Eugenie Filler,  MD  lisinopril (PRINIVIL,ZESTRIL) 2.5 MG tablet Take 1 tablet (2.5 mg total) by mouth daily. 05/12/13  Yes Eugenie Filler, MD  metoprolol (LOPRESSOR) 50 MG tablet Take 1 tablet (50 mg total) by mouth 2 (two) times daily. Patient taking differently: Take 50 mg by mouth daily.  05/12/13  Yes Eugenie Filler, MD   mirtazapine (REMERON) 15 MG tablet Take 15 mg by mouth daily.   Yes Historical Provider, MD  rosuvastatin (CRESTOR) 10 MG tablet Take 10 mg by mouth daily.   Yes Historical Provider, MD  traMADol (ULTRAM) 50 MG tablet Take 50 mg by mouth every 6 (six) hours as needed for moderate pain.   Yes Historical Provider, MD  ALPRAZolam (XANAX) 0.25 MG tablet Take 1 tablet (0.25 mg total) by mouth 2 (two) times daily as needed for anxiety. Patient not taking: Reported on 12/22/2014 05/12/13   Eugenie Filler, MD  aspirin 81 MG chewable tablet Chew 1 tablet (81 mg total) by mouth daily. Patient not taking: Reported on 10/30/2014 05/12/13   Eugenie Filler, MD  atorvastatin (LIPITOR) 40 MG tablet Take 1 tablet (40 mg total) by mouth daily at 6 PM. Patient not taking: Reported on 12/22/2014 05/12/13   Eugenie Filler, MD  azithromycin (ZITHROMAX) 250 MG tablet Take 1 tablet (250 mg total) by mouth once. Patient not taking: Reported on 12/22/2014 11/03/14   Fritzi Mandes, MD  cefUROXime (CEFTIN) 500 MG tablet Take 1 tablet (500 mg total) by mouth 2 (two) times daily with a meal. Patient not taking: Reported on 12/22/2014 11/03/14   Fritzi Mandes, MD  furosemide (LASIX) 40 MG tablet Take 1 tablet (40 mg total) by mouth daily. Patient not taking: Reported on 10/31/2014 05/12/13   Eugenie Filler, MD  isosorbide-hydrALAZINE (BIDIL) 20-37.5 MG per tablet Take 1 tablet by mouth 3 (three) times daily. Patient not taking: Reported on 10/30/2014 05/12/13   Eugenie Filler, MD  potassium chloride SA (K-DUR,KLOR-CON) 20 MEQ tablet Take 1 tablet (20 mEq total) by mouth daily. Patient not taking: Reported on 12/22/2014 05/12/13   Eugenie Filler, MD      VITAL SIGNS:  Blood pressure 103/82, pulse 105, temperature 99.2 F (37.3 C), temperature source Rectal, resp. rate 15, height 5\' 6"  (1.676 m), weight 210 lb (95.255 kg), SpO2 100 %.  PHYSICAL EXAMINATION:  VITAL SIGNS: Filed Vitals:   12/22/14 0120  BP:   Pulse:   Temp:  99.2 F (37.3 C)  Resp:    GENERAL:79 y.o.female currently in mild acute distress given respiratory status. Chronically ill appearing HEAD: Normocephalic, atraumatic.  EYES: Pupils equal, round, reactive to light. Extraocular muscles intact. No scleral icterus.  MOUTH: Moist mucosal membrane. Dentition nonexistent. No abscess noted.  EAR, NOSE, THROAT: Clear without exudates. No external lesions.  NECK: Supple. No thyromegaly. No nodules. No JVD.  PULMONARY: Right basilar coarse breath sounds with scattered rhonchi without wheeze  No use of accessory muscles, Good respiratory effort. good air entry bilaterally CHEST: Nontender to palpation.  CARDIOVASCULAR: S1 and S2. Tachycardic. No murmurs, rubs, or gallops. No edema. Pedal pulses 2+ bilaterally.  GASTROINTESTINAL: Soft, nontender, nondistended. No masses. Positive bowel sounds. No hepatosplenomegaly.  MUSCULOSKELETAL: No swelling, clubbing, or edema. Range of motion passively full in all extremities.  NEUROLOGIC: Cranial nerves II through XII are intact. No gross focal neurological deficits. Sensation intact. Reflexes intact.  SKIN: No ulceration, lesions, rashes, or cyanosis. Skin warm and dry. Turgor intact.  PSYCHIATRIC: Mood, affect within normal limits.  The patient is awake, alert and oriented x 3. Insight, judgment intact.    LABORATORY PANEL:   CBC  Recent Labs Lab 12/21/14 2258  WBC 37.7*  HGB 11.4*  HCT 36.7  PLT 501*   ------------------------------------------------------------------------------------------------------------------  Chemistries   Recent Labs Lab 12/21/14 2258  NA 136  K 4.7  CL 98*  CO2 23  GLUCOSE 175*  BUN 49*  CREATININE 1.46*  CALCIUM 8.6*  AST 20  ALT 9*  ALKPHOS 60  BILITOT 1.1   ------------------------------------------------------------------------------------------------------------------  Cardiac Enzymes  Recent Labs Lab 12/21/14 2258  TROPONINI <0.03    ------------------------------------------------------------------------------------------------------------------  RADIOLOGY:  Ct Head Wo Contrast  12/22/2014   CLINICAL DATA:  Altered mental status beginning today.  EXAM: CT HEAD WITHOUT CONTRAST  TECHNIQUE: Contiguous axial images were obtained from the base of the skull through the vertex without intravenous contrast.  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 No fracture or destructive process.  Chronic opacification of a posterior right ethmoid air cell without expansion.  Orbits: No acute abnormality.  Brain: No evidence of acute infarction, hemorrhage, hydrocephalus, or mass lesion/mass effect.  Atrophy with ventriculomegaly which is stable from prior and only mildly progressed from 2014. Confluent low-density in the periventricular white matter bifrontally consistent with chronic small vessel disease. There have been remote perforator infarcts in the left corona radiata and right anterior internal capsule.  IMPRESSION: No acute finding or change from February 2016.   Electronically Signed   By: Monte Fantasia M.D.   On: 12/22/2014 01:33   Ct Angio Chest Pe W/cm &/or Wo Cm  12/22/2014   CLINICAL DATA:  Acute onset of altered mental status. Cough. Initial encounter.  EXAM: CT ANGIOGRAPHY CHEST WITH CONTRAST  TECHNIQUE: Multidetector CT imaging of the chest was performed using the standard protocol during bolus administration of intravenous contrast. Multiplanar CT image reconstructions and MIPs were obtained to evaluate the vascular anatomy.  CONTRAST:  74mL OMNIPAQUE IOHEXOL 350 MG/ML SOLN  COMPARISON:  Chest radiograph performed 10/21/2014, and CT of the chest performed 06/17/2014  FINDINGS: There is no evidence of pulmonary embolus.  There is dense consolidation of the right lower lobe, with fluid and debris filling the right mainstem bronchus and bronchioles to the right lower lobe.  Additional fluid and debris are seen extending into the right upper lobe, with underlying patchy airspace opacities, compatible with significant aspiration pneumonia. Trace underlying right-sided pleural fluid is noted. Minimal left-sided atelectasis and scarring are seen. There is no evidence of pneumothorax. No masses are identified; no abnormal focal contrast enhancement is seen.  There is rightward mediastinal shift, as previously noted, reflecting right-sided volume loss. A 1.3 cm right paratracheal node likely reflects the underlying acute infectious process. A likely enlarged 1.3 cm subcarinal node is seen, with adjacent edema. No pericardial effusion is identified. Scattered calcification is noted along the aortic arch and proximal great vessels. No axillary lymphadenopathy is seen. The visualized portions of the thyroid gland are unremarkable in appearance.  The visualized portions of the liver and spleen are unremarkable. The visualized portions of the stomach, adrenal glands and kidneys are within normal limits.  No acute osseous abnormalities are seen.  Review of the MIP images confirms the above findings.  IMPRESSION: 1. No evidence of pulmonary embolus. 2. Dense right lower lobe consolidation, with fluid and debris filling the right mainstem bronchus and bronchioles to the right lower lobe. Additional fluid and debris extending into the right upper  lobe, with underlying patchy airspace opacities, compatible with significant right-sided aspiration pneumonia. Trace underlying right-sided pleural fluid noted. 3. Minimal left-sided atelectasis and scarring seen. 4. Rightward mediastinal shift noted, reflecting right-sided volume loss. 5. Enlarged mediastinal nodes likely reflect the underlying acute infectious process. These results were called by telephone at the time of interpretation on 12/22/2014 at 1:44 am to Dr. Lurline Hare, who verbally acknowledged these results.   Electronically Signed   By: Garald Balding M.D.   On: 12/22/2014 01:45   Dg Chest Port 1 View  12/22/2014   CLINICAL DATA:  Repeat chest radiograph requested. Subsequent encounter.  EXAM: PORTABLE CHEST - 1 VIEW  COMPARISON:  Chest radiograph performed earlier today at 11:20 p.m.  FINDINGS: There is slightly improved rightward mediastinal shift. Right basilar airspace opacity is better characterized, and raises concern for pneumonia. A small right pleural effusion is suspected. No pneumothorax is seen. The left lung appears clear.  The cardiomediastinal silhouette is borderline normal in size. No acute osseous abnormalities are identified.  IMPRESSION: Slightly improved rightward mediastinal shift. Right basilar airspace opacity is better characterized, and raises concern for pneumonia. Suspect small right pleural effusion. Followup PA and lateral chest X-ray is recommended in 3-4 weeks following trial of antibiotic therapy to ensure resolution and exclude underlying malignancy.   Electronically Signed   By: Garald Balding M.D.   On: 12/22/2014 00:35   Dg Chest Port 1 View  12/22/2014   CLINICAL DATA:  Acute onset of confusion.  Initial encounter.  EXAM: PORTABLE CHEST - 1 VIEW  COMPARISON:  Chest radiograph performed 11/01/2014  FINDINGS: There is rightward displacement of the mediastinum, which appears to reflect right-sided volume loss. Underlying right basilar pneumonia cannot be excluded. The left lung appears clear. No definite pleural effusion or pneumothorax is seen.  The cardiomediastinal silhouette is borderline normal in size. Prominence of the right paratracheal soft tissues is relatively stable, and may reflect normal vasculature. No acute osseous abnormalities are identified.  IMPRESSION: Rightward displacement of the mediastinum, which appears to reflect right-sided volume loss. Underlying right basilar pneumonia cannot be excluded. Would correlate for associated symptoms.  A central obstructing mass cannot be entirely excluded.  Followup PA and lateral chest X-ray is recommended in 3-4 weeks following treatment, to ensure resolution and exclude underlying malignancy.   Electronically Signed   By: Garald Balding M.D.   On: 12/22/2014 00:19    EKG:   Orders placed or performed during the hospital encounter of 10/29/14  . ED EKG  . ED EKG  . EKG 12-Lead  . EKG 12-Lead  . EKG    IMPRESSION AND PLAN:   80 year old Caucasian female history of COPD, stroke presenting with altered mental status and cough.  1.Sepsis, meeting septic criteria by leukocytosis, respiratory rate, tachycardia present on arrival. Source healthcare associated pneumonia Code sepsis initiated. Panculture. Broad-spectrum antibiotics including vancomycin/Zosyn and taper antibiotics when culture data returns. He has received a 30 mL/kg IV fluid bolus. Continue IV fluid hydration to keep mean arterial pressure greater than 65. He may require pressor therapy if blood pressure worsens. We will repeat lactic acid given the initial is greater than 2.2.   2. Acute respiratory insufficiency: Supplemental oxygen, neb treatments 3. Type 2 diabetes insulin requiring: Continue basal insulin, at insulin sliding scale, hold oral agents 4. Essential hypertension: Lopressor, lisinopril 5. Venous thromboembolism prophylactic: Heparin subcutaneous     All the records are reviewed and case discussed with ED provider. Management plans discussed with  the patient, family and they are in agreement.  CODE STATUS: DO NOT RESUSCITATE  TOTAL TIME TAKING CARE OF THIS PATIENT: 35 minutes.    Joanmarie Tsang,  Karenann Cai.D on 12/22/2014 at 2:24 AM  Between 7am to 6pm - Pager - [phone]  After 6pm: House Pager: - [phone]  [facility]  Office  [phone]  CC: Primary care physician; Alvester Chou, NP

## 2014-12-22 NOTE — Progress Notes (Signed)
Patients lungs sounds are coarse/congested. Change since this am. Spoke with dr. Verdell Carmine to make aware. Per md discontinue ns @75 . Order abg since patient is currently on nrb. Will continue to monitor [employer]

## 2014-12-23 LAB — GLUCOSE, CAPILLARY
GLUCOSE-CAPILLARY: 92 mg/dL (ref 65–99)
GLUCOSE-CAPILLARY: 83 mg/dL (ref 65–99)
GLUCOSE-CAPILLARY: 150 mg/dL — AB (ref 65–99)
Glucose-Capillary: 88 mg/dL (ref 65–99)
Glucose-Capillary: 104 mg/dL — ABNORMAL HIGH (ref 65–99)

## 2014-12-23 LAB — BASIC METABOLIC PANEL
Anion gap: 11 (ref 5–15)
BUN: 37 mg/dL — AB (ref 6–20)
CHLORIDE: 105 mmol/L (ref 101–111)
CO2: 24 mmol/L (ref 22–32)
CREATININE: 1.41 mg/dL — AB (ref 0.44–1.00)
Calcium: 8.3 mg/dL — ABNORMAL LOW (ref 8.9–10.3)
GFR calc non Af Amer: 35 mL/min — ABNORMAL LOW (ref >60)
GFR, EST AFRICAN AMERICAN: 40 mL/min — AB (ref >60)
GLUCOSE: 96 mg/dL (ref 65–99)
Potassium: 4 mmol/L (ref 3.5–5.1)
Sodium: 140 mmol/L (ref 135–145)

## 2014-12-23 LAB — CBC
HEMATOCRIT: 30.8 % — AB (ref 35.0–47.0)
HEMOGLOBIN: 9.8 g/dL — AB (ref 12.0–16.0)
MCH: 26.4 pg (ref 26.0–34.0)
MCHC: 31.7 g/dL — AB (ref 32.0–36.0)
MCV: 83.1 fL (ref 80.0–100.0)
Platelets: 430 10*3/uL (ref 150–440)
RBC: 3.71 MIL/uL — ABNORMAL LOW (ref 3.80–5.20)
RDW: 16.9 % — ABNORMAL HIGH (ref 11.5–14.5)
WBC: 23.1 10*3/uL — ABNORMAL HIGH (ref 3.6–11.0)

## 2014-12-23 NOTE — Evaluation (Signed)
Clinical/Bedside Swallow Evaluation Patient Details  Name: Brenda Hogan MRN: [id number] Date of Birth: 1934-08-07  Today's Date: 12/23/2014 Time: SLP Start Time (ACUTE ONLY): 1110 SLP Stop Time (ACUTE ONLY): 1210 SLP Time Calculation (min) (ACUTE ONLY): 60 min  Past Medical History:  Past Medical History  Diagnosis Date  . Stroke   . COPD (chronic obstructive pulmonary disease)   . Coronary artery disease   . Hypertension   . Diabetes mellitus type 1    Past Surgical History:  Past Surgical History  Procedure Laterality Date  . Total knee arthroplasty Bilateral    HPI:  Pt is a 80 y.o. female with a known history of COPD non-oxygen dependent, type 2 diabetes uncomplicated, hypertension, CVA, CAD who presented with cough and altered mental status. Pt had a decline in respiratory status yesterday and was placed on increased O2 support; she is currently on HFNC 50% and weaning per MD. Pt is currently NPO d/t increased concern for aspiration. Pt described she was bedbound/wheelchair bound but more bedbound recently not getting into her w/c. This can lead to declined respiratory status.    Assessment / Plan / Recommendation Clinical Impression  Pt appears to present w/ no overt s/s of aspiration w/ trials of thin liquids and purees; no overt coughing or throat clearing noted, no change in vocal quality noted b/t trials given. Pt utilized straw appropriately taking po trials but given rest breaks to avoid SOB. Pt assisted w/ feeding sec. to weakness. Solids were not assessed at this eval but will be given as po trials in order to upgrade diet consistency as appropriate. Rec. a Dys. 1 diet w/ thin liquids at this time; aspiration precautions; meds in Puree. Assistance at all meals. ST will f/u tomorrow. If silent aspiration is suspected, then an objective swallowing assessment would be indicated. MD/NSG updated.     Aspiration Risk   (reduced-min. sec. to declined respiratory status)    Diet Recommendation Dysphagia 1 (Puree);Thin   Medication Administration: Whole meds with puree Compensations: Minimize environmental distractions;Slow rate;Small sips/bites (rest breaks to avoid SOB)    Other  Recommendations Recommended Consults:  (dietician) Oral Care Recommendations: Oral care BID;Staff/trained caregiver to provide oral care Other Recommendations:  (objective swallow study as indicated )   Follow Up Recommendations       Frequency and Duration min 3x week  1 week   Pertinent Vitals/Pain denied    SLP Swallow Goals  see care plan   Swallow Study Prior Functional Status   resided at home; required total assistance w/ most ADLs; bedbound.     General Date of Onset: 12/21/14 Other Pertinent Information: Pt is a 80 y.o. female with a known history of COPD non-oxygen dependent, type 2 diabetes uncomplicated, hypertension, CVA, CAD who presented with cough and altered mental status. Pt had a decline in respiratory status yesterday and was placed on increased O2 support; she is currently on HFNC 50% and weaning per MD. Pt is currently NPO d/t increased concern for aspiration. Pt described she was bedbound/wheelchair bound but more bedbound recently not getting into her w/c. This can lead to declined respiratory status.  Type of Study: Bedside swallow evaluation Previous Swallow Assessment: during recent admission; pt was then rec'd to be on a dysphagia diet w/ Nectar liquids. She d/c'd to SNF but did not remain on the thickened liquids upon returning home.  Diet Prior to this Study: Regular;Thin liquids (per pt, but suspect softer foods per pt's description) Temperature Spikes Noted: No (  wbc down to 23 from 37 yesterday) Respiratory Status:  (HFNC 50%) History of Recent Intubation: No Behavior/Cognition: Alert;Cooperative;Pleasant mood Oral Cavity - Dentition: Edentulous Self-Feeding Abilities: Needs assist;Needs set up Patient Positioning: Upright in bed Baseline  Vocal Quality: Low vocal intensity Volitional Cough: Strong Volitional Swallow: Able to elicit    Oral/Motor/Sensory Function Overall Oral Motor/Sensory Function: Appears within functional limits for tasks assessed Labial ROM: Within Functional Limits Labial Symmetry: Within Functional Limits Labial Strength: Within Functional Limits Lingual ROM: Within Functional Limits Lingual Symmetry: Within Functional Limits Lingual Strength: Within Functional Limits Mandible: Within Functional Limits   Ice Chips Ice chips: Within functional limits Presentation: Spoon (fed; x3)   Thin Liquid Thin Liquid: Within functional limits Presentation: Self Fed;Straw (assisted; 5 trials)    Nectar Thick Nectar Thick Liquid: Not tested   Honey Thick Honey Thick Liquid: Not tested   Puree Puree: Within functional limits Presentation: Self Fed;Spoon (assisted; 6 trials)   Solid   GO    Solid: Not tested      Brenda Kenner, Brenda Hogan, Brenda Hogan  Watson,Katherine 12/23/2014,3:42 PM

## 2014-12-23 NOTE — Progress Notes (Signed)
[place] [facility] [place] NAME: Brenda Hogan    MR#:  [id number]  DATE OF BIRTH:  12-10-1934  SUBJECTIVE:  CHIEF COMPLAINT:   Chief Complaint  Patient presents with  . Altered Mental Status   Patient here due to altered mental status and also acute respiratory failure and noted to have a aspiration pneumonia. Remains hypoxic and is on a high flow nasal cannula but FiO2 down to 50%. Mental status much improved and back to baseline. Seen by speech therapy and started on Pureed diet with thin liquids.    REVIEW OF SYSTEMS:    Review of Systems  Constitutional: Negative for fever and chills.  HENT: Negative for congestion and tinnitus.   Eyes: Negative for blurred vision and double vision.  Respiratory: Positive for cough and sputum production. Negative for shortness of breath and wheezing.   Cardiovascular: Negative for chest pain, orthopnea and PND.  Gastrointestinal: Negative for nausea, vomiting, abdominal pain and diarrhea.  Genitourinary: Negative for dysuria and hematuria.  Neurological: Positive for weakness (generalized). Negative for dizziness, sensory change and focal weakness.  All other systems reviewed and are negative.   Nutrition:  Pureed w/ thin liquids Tolerating Diet: yes Tolerating PT:  Await Eval  DRUG ALLERGIES:  No Known Allergies  VITALS:  Blood pressure 140/44, pulse 72, temperature 98.4 F (36.9 C), temperature source Oral, resp. rate 16, height 5' 6"  (1.676 m), weight 62.46 kg (137 lb 11.2 oz), SpO2 96 %.  PHYSICAL EXAMINATION:   Physical Exam  GENERAL:  80 y.o.-year-old patient lying in the bed in mild resp. distress.  EYES: Pupils equal, round, reactive to light and accommodation. No scleral icterus. Extraocular muscles intact.  HEENT: Head atraumatic, normocephalic. Oropharynx and nasopharynx clear. Moist Oral mucosa NECK:  Supple, no jugular venous distention. No thyroid enlargement, no  tenderness.  LUNGS: No use of accessory muscles.  Rhonchi, crackles on right lung field.  CARDIOVASCULAR: S1, S2 normal. No murmurs, rubs, or gallops.  ABDOMEN: Soft, nontender, nondistended. Bowel sounds present. No organomegaly or mass.  EXTREMITIES: No cyanosis, clubbing or +1 dependent edema b/l.    NEUROLOGIC: Cranial nerves II through XII are intact. No focal Motor or sensory deficits b/l.  Globally weak PSYCHIATRIC: The patient is alert and oriented x 3. Good affect. SKIN: No obvious rash, lesion, or ulcer.    LABORATORY PANEL:   CBC  Recent Labs Lab 12/23/14 0436  WBC 23.1*  HGB 9.8*  HCT 30.8*  PLT 430   ------------------------------------------------------------------------------------------------------------------  Chemistries   Recent Labs Lab 12/21/14 2258 12/23/14 0436  NA 136 140  K 4.7 4.0  CL 98* 105  CO2 23 24  GLUCOSE 175* 96  BUN 49* 37*  CREATININE 1.46* 1.41*  CALCIUM 8.6* 8.3*  AST 20  --   ALT 9*  --   ALKPHOS 60  --   BILITOT 1.1  --    ------------------------------------------------------------------------------------------------------------------  Cardiac Enzymes  Recent Labs Lab 12/21/14 2258  TROPONINI <0.03   ------------------------------------------------------------------------------------------------------------------  RADIOLOGY:  Ct Head Wo Contrast  12/22/2014   CLINICAL DATA:  Altered mental status beginning today.  EXAM: CT HEAD WITHOUT CONTRAST  TECHNIQUE: Contiguous axial images were obtained from the base of the skull through the vertex without intravenous contrast.  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 No fracture or destructive process.  Chronic opacification of a posterior right ethmoid air cell without expansion.  Orbits: No acute abnormality.  Brain: No evidence of acute infarction, hemorrhage, hydrocephalus, or mass lesion/mass effect.  Atrophy with  ventriculomegaly which is stable from prior and only mildly progressed from 2014. Confluent low-density in the periventricular white matter bifrontally consistent with chronic small vessel disease. There have been remote perforator infarcts in the left corona radiata and right anterior internal capsule.  IMPRESSION: No acute finding or change from February 2016.   Electronically Signed   By: Monte Fantasia M.D.   On: 12/22/2014 01:33   Ct Angio Chest Pe W/cm &/or Wo Cm  12/22/2014   CLINICAL DATA:  Acute onset of altered mental status. Cough. Initial encounter.  EXAM: CT ANGIOGRAPHY CHEST WITH CONTRAST  TECHNIQUE: Multidetector CT imaging of the chest was performed using the standard protocol during bolus administration of intravenous contrast. Multiplanar CT image reconstructions and MIPs were obtained to evaluate the vascular anatomy.  CONTRAST:  49m OMNIPAQUE IOHEXOL 350 MG/ML SOLN  COMPARISON:  Chest radiograph performed 10/21/2014, and CT of the chest performed 06/17/2014  FINDINGS: There is no evidence of pulmonary embolus.  There is dense consolidation of the right lower lobe, with fluid and debris filling the right mainstem bronchus and bronchioles to the right lower lobe. Additional fluid and debris are seen extending into the right upper lobe, with underlying patchy airspace opacities, compatible with significant aspiration pneumonia. Trace underlying right-sided pleural fluid is noted. Minimal left-sided atelectasis and scarring are seen. There is no evidence of pneumothorax. No masses are identified; no abnormal focal contrast enhancement is seen.  There is rightward mediastinal shift, as previously noted, reflecting right-sided volume loss. A 1.3 cm right paratracheal node likely reflects the underlying acute infectious process. A likely enlarged 1.3 cm subcarinal node is seen, with adjacent edema. No pericardial effusion is identified. Scattered calcification is noted along the aortic arch and  proximal great vessels. No axillary lymphadenopathy is seen. The visualized portions of the thyroid gland are unremarkable in appearance.  The visualized portions of the liver and spleen are unremarkable. The visualized portions of the stomach, adrenal glands and kidneys are within normal limits.  No acute osseous abnormalities are seen.  Review of the MIP images confirms the above findings.  IMPRESSION: 1. No evidence of pulmonary embolus. 2. Dense right lower lobe consolidation, with fluid and debris filling the right mainstem bronchus and bronchioles to the right lower lobe. Additional fluid and debris extending into the right upper lobe, with underlying patchy airspace opacities, compatible with significant right-sided aspiration pneumonia. Trace underlying right-sided pleural fluid noted. 3. Minimal left-sided atelectasis and scarring seen. 4. Rightward mediastinal shift noted, reflecting right-sided volume loss. 5. Enlarged mediastinal nodes likely reflect the underlying acute infectious process. These results were called by telephone at the time of interpretation on 12/22/2014 at 1:44 am to Dr. JLurline Hare who verbally acknowledged these results.   Electronically Signed   By: JGarald BaldingM.D.   On: 12/22/2014 01:45   Dg Chest Port 1 View  12/22/2014   CLINICAL DATA:  Repeat chest radiograph requested. Subsequent encounter.  EXAM: PORTABLE CHEST - 1 VIEW  COMPARISON:  Chest radiograph performed earlier today at 11:20 p.m.  FINDINGS: There is slightly improved rightward mediastinal shift. Right basilar airspace opacity is better characterized, and raises concern for pneumonia. A small right pleural effusion is suspected. No pneumothorax is seen. The left lung appears clear.  The cardiomediastinal silhouette is borderline normal in size. No acute osseous abnormalities are identified.  IMPRESSION: Slightly improved rightward mediastinal shift. Right basilar airspace  opacity is better characterized, and raises  concern for pneumonia. Suspect small right pleural effusion. Followup PA and lateral chest X-ray is recommended in 3-4 weeks following trial of antibiotic therapy to ensure resolution and exclude underlying malignancy.   Electronically Signed   By: Garald Balding M.D.   On: 12/22/2014 00:35   Dg Chest Port 1 View  12/22/2014   CLINICAL DATA:  Acute onset of confusion.  Initial encounter.  EXAM: PORTABLE CHEST - 1 VIEW  COMPARISON:  Chest radiograph performed 11/01/2014  FINDINGS: There is rightward displacement of the mediastinum, which appears to reflect right-sided volume loss. Underlying right basilar pneumonia cannot be excluded. The left lung appears clear. No definite pleural effusion or pneumothorax is seen.  The cardiomediastinal silhouette is borderline normal in size. Prominence of the right paratracheal soft tissues is relatively stable, and may reflect normal vasculature. No acute osseous abnormalities are identified.  IMPRESSION: Rightward displacement of the mediastinum, which appears to reflect right-sided volume loss. Underlying right basilar pneumonia cannot be excluded. Would correlate for associated symptoms.  A central obstructing mass cannot be entirely excluded. Followup PA and lateral chest X-ray is recommended in 3-4 weeks following treatment, to ensure resolution and exclude underlying malignancy.   Electronically Signed   By: Garald Balding M.D.   On: 12/22/2014 00:19     ASSESSMENT AND PLAN:   80 year old female with past medical history of previous CVA, COPD, hypertension, diabetes, history of coronary disease, who presented to the hospital due to altered mental status and also noted to have a pneumonia.  #1 altered mental status-this is likely metabolic encephalopathy due to the pneumonia. -Patient's CT head was negative for any acute pathology. Mental status much improved and back to baseline.  #2 sepsis-patient met criteria on admission with leukocytosis, tachycardia,  tachypnea and  positive chest x-ray findings suggestive of pneumonia. -This is likely aspiration pneumonia. Continue broad-spectrum IV antibiotics with vancomycin and Zosyn  -Blood cultures positive for gram-positive cocci but identification and sensitivities pending.  #3 pneumonia-likely aspiration pneumonia given her CT scan findings. Continue IV vancomycin, Zosyn. -Seen by speech therapy and started on a pureed Diet with thin liquids as resp. Status has improved.    #4 acute respiratory failure with hypoxia-this is likely secondary to pneumonia. -Continue O2 supplementation with high flow nasal cannula and wean as tolerated. Continue treatment of pneumonia as mentioned above.  #5 hypertension-continue lisinopril, metoprolol.  #6 diabetes-continue Levemir, sliding scale insulin.  #7 history of previous CVA-continue Plavix, statin.  #8 leukocytosis-likely due to infection and improving with IV antibiotic therapy. -Patient seems to have a persistent leukocytosis in the past and may benefit from outpatient hematology/oncology evaluation.  All the records are reviewed and case discussed with Care Management/Social Workerr. Management plans discussed with the patient, family and they are in agreement.  CODE STATUS: DO NOT RESUSCITATE  DVT Prophylaxis: Heparin subcutaneous  TOTAL TIME TAKING CARE OF THIS PATIENT: 25 minutes.   POSSIBLE D/C IN 2-3 DAYS, DEPENDING ON CLINICAL CONDITION.   Henreitta Leber M.D on 12/23/2014 at 2:53 PM  Between 7am to 6pm - Pager - [phone]  After 6pm go to [email or web address] - password [email or web address] [facility]  [facility]  Office  [phone]  CC: Primary care physician; Alvester Chou, NP

## 2014-12-23 NOTE — Progress Notes (Signed)
Notified Dr Reece Levy of positive blood cultures.  Acknowledged.  No further interventions ordered at this time.  Lynnda Shields, RN

## 2014-12-24 LAB — CBC
HCT: 29.6 % — ABNORMAL LOW (ref 35.0–47.0)
HEMOGLOBIN: 9.4 g/dL — AB (ref 12.0–16.0)
MCH: 26.5 pg (ref 26.0–34.0)
MCHC: 31.7 g/dL — AB (ref 32.0–36.0)
MCV: 83.4 fL (ref 80.0–100.0)
PLATELETS: 429 10*3/uL (ref 150–440)
RBC: 3.55 MIL/uL — ABNORMAL LOW (ref 3.80–5.20)
RDW: 16.6 % — ABNORMAL HIGH (ref 11.5–14.5)
WBC: 18.6 10*3/uL — ABNORMAL HIGH (ref 3.6–11.0)

## 2014-12-24 LAB — GLUCOSE, CAPILLARY
GLUCOSE-CAPILLARY: 124 mg/dL — AB (ref 65–99)
GLUCOSE-CAPILLARY: 65 mg/dL (ref 65–99)
Glucose-Capillary: 138 mg/dL — ABNORMAL HIGH (ref 65–99)

## 2014-12-24 LAB — BCR-ABL1, CML/ALL, PCR, QUANT

## 2014-12-24 NOTE — Progress Notes (Signed)
Speech Language Pathology Treatment: Dysphagia  Patient Details Name: Brenda Hogan MRN: [id number] DOB: 1935/01/06 Today's Date: 12/24/2014 Time: 0962-8366 SLP Time Calculation (min) (ACUTE ONLY): 35 min  Assessment / Plan / Recommendation Clinical Impression  Pt appears to be tolerating her current diet of Dys. (puree) w/ thin liquids via straw w/ no overt s/s of aspiration reported by pt or staff, and no significant oral phase deficits w/t he purees noted. Pt cleared orally b/t bites given time and rest breaks to avoid SOB; pt remains on HFNC O2 support. Clear vocal quality and no decline in respiratory status was noted during the po trials - mostly liquids. Pt consumed 5-6 sips each of water, then Diet Dr. Malachi Hogan. Pt exhibited slow oral movements overall as she coordinated her timing of oral management, A-P transfer, then swallowing. She exhibited low volume speech and even seemed min. Confused wondering if she had been dreaming when she asked if she had a "baby" w/ her. She seemed more relaxed in her breathing and thoughts once NSG and SLP positioned her more upright in the bed for better breathing at the beginning of the session. Pt continues to present w/ an increased risk for aspiration. Rec. Continue w/ current Dys. 1 diet w/ thin liquids via straw as long as no overt s/s of aspiration noted; strict aspiration precautions including rest breaks during meals/po's; meds in Puree; feeding assistance at meals. ST will f/u w/ trials to upgrade diet next 1-3 days when pt's respiratory and medical status' have improved for management of such. NSG updated.   HPI Other Pertinent Information: Pt is a 80 y.o. female with a known history of COPD non-oxygen dependent, type 2 diabetes uncomplicated, hypertension, CVA, CAD who presented with cough and altered mental status. Pt had a decline in respiratory status yesterday and was placed on increased O2 support; she is currently on HFNC 50% and weaning per  MD. Pt is currently NPO d/t increased concern for aspiration. Pt described she was bedbound/wheelchair bound but more bedbound recently not getting into her w/c. This can lead to a declined respiratory status. Pt is currently on a Pureed diet w/ thin liquids and tolerating bites/sips per pt and staff report(not too much intake overall; Dietician following). Pt reported initially she felt her breathing was effortful for her but once repositioned upright in bed, she felt she was "breathing better". She denied being hhungry but wanted Dr. Malachi Hogan.   Pertinent Vitals Pain Assessment: No/denies pain  SLP Plan  Continue with current plan of care    Recommendations Diet recommendations: Dysphagia 1 (puree);Thin liquid Liquids provided via: Straw (but no straws if coughing noted) Medication Administration: Whole meds with puree Supervision: Staff to assist with self feeding;Full supervision/cueing for compensatory strategies Compensations: Slow rate;Small sips/bites;Minimize environmental distractions (rest breaks) Postural Changes and/or Swallow Maneuvers: Seated upright 90 degrees              General recommendations:  (Dietician) Oral Care Recommendations: Oral care BID;Staff/trained caregiver to provide oral care Follow up Recommendations: Skilled Nursing facility (TBD; Palliative care consult possible) Plan: Continue with current plan of care    [place], [place], CCC-SLP  Brenda Hogan 12/24/2014, 1:38 PM

## 2014-12-24 NOTE — Care Management Important Message (Signed)
Important Message  Patient Details  Name: Rhyleigh Grassel MRN: [id number] Date of Birth: 17-Nov-1934   Medicare Important Message Given:  Yes-second notification given    Juliann Pulse A Allmond 12/24/2014, 9:22 AM

## 2014-12-24 NOTE — Care Management (Signed)
Although patient does have chronic home 02, her 02 requirements have increased and is now on HFNC at 40%. Weaning is in progress.  Per home health  report, patient is not compliant with with her dietary orders in place for aspiration

## 2014-12-24 NOTE — Progress Notes (Signed)
[place] at [place] NAME: Brenda Hogan    MR#:  [id number]  DATE OF BIRTH:  [birth year]  SUBJECTIVE:  CHIEF COMPLAINT:   Chief Complaint  Patient presents with  . Altered Mental Status   Patient here due to altered mental status and also acute respiratory failure and noted to have a aspiration pneumonia. Remains hypoxic and is on a high flow nasal cannula at 45% FiO2. Mental status much improved and back to baseline. Tolerating Pureed diet o.k.     REVIEW OF SYSTEMS:    Review of Systems  Constitutional: Negative for fever and chills.  HENT: Negative for congestion and tinnitus.   Eyes: Negative for blurred vision and double vision.  Respiratory: Positive for cough and sputum production. Negative for shortness of breath and wheezing.   Cardiovascular: Negative for chest pain, orthopnea and PND.  Gastrointestinal: Negative for nausea, vomiting, abdominal pain and diarrhea.  Genitourinary: Negative for dysuria and hematuria.  Neurological: Positive for weakness (generalized). Negative for dizziness, sensory change and focal weakness.  All other systems reviewed and are negative.   Nutrition:  Pureed w/ thin liquids Tolerating Diet: yes Tolerating PT:  Await Eval but pt. Is bedbound.   DRUG ALLERGIES:  No Known Allergies  VITALS:  Blood pressure 127/64, pulse 45, temperature 97.6 F (36.4 C), temperature source Oral, resp. rate 22, height _0  (1.676 m), weight 62.46 kg (137 lb 11.2 oz), SpO2 89 %.  PHYSICAL EXAMINATION:   Physical Exam  GENERAL:  80 y.o.-year-old patient lying in the bed in mild resp. distress.  EYES: Pupils equal, round, reactive to light and accommodation. No scleral icterus. Extraocular muscles intact.  HEENT: Head atraumatic, normocephalic. Oropharynx and nasopharynx clear. Moist Oral mucosa NECK:  Supple, no jugular venous distention. No thyroid enlargement, no tenderness.  LUNGS: No use of  accessory muscles.  Rhonchi, crackles in right mid/lower lung fields.  CARDIOVASCULAR: S1, S2 normal. No murmurs, rubs, or gallops.  ABDOMEN: Soft, nontender, nondistended. Bowel sounds present. No organomegaly or mass.  EXTREMITIES: No cyanosis, clubbing,  +1 dependent edema b/l.    NEUROLOGIC: Cranial nerves II through XII are intact. No focal Motor or sensory deficits b/l.  Globally weak PSYCHIATRIC: The patient is alert and oriented x 3. Good affect. SKIN: No obvious rash, lesion, or ulcer.    LABORATORY PANEL:   CBC  Recent Labs Lab 12/24/14 0835  WBC 18.6*  HGB 9.4*  HCT 29.6*  PLT 429   ------------------------------------------------------------------------------------------------------------------  Chemistries   Recent Labs Lab 12/21/14 2258 12/23/14 0436  NA 136 140  K 4.7 4.0  CL 98* 105  CO2 23 24  GLUCOSE 175* 96  BUN 49* 37*  CREATININE 1.46* 1.41*  CALCIUM 8.6* 8.3*  AST 20  --   ALT 9*  --   ALKPHOS 60  --   BILITOT 1.1  --    ------------------------------------------------------------------------------------------------------------------  Cardiac Enzymes  Recent Labs Lab 12/21/14 2258  TROPONINI <0.03   ------------------------------------------------------------------------------------------------------------------  RADIOLOGY:  No results found.   ASSESSMENT AND PLAN:   80 year old female with past medical history of previous CVA, COPD, hypertension, diabetes, history of coronary disease, who presented to the hospital due to altered mental status and also noted to have a pneumonia.  #1 altered mental status-this is likely metabolic encephalopathy due to the pneumonia. -Patient's CT head was negative for any acute pathology. Mental status much improved and back to baseline now.  #2 sepsis-patient met criteria on admission  with leukocytosis, tachycardia, tachypnea and  positive chest x-ray findings suggestive of pneumonia. -This is  likely aspiration pneumonia. BC + for Coagulase (-) Staph and ?? Contaminant.  Will d/c Vanc and cont. Zosyn.     #3 pneumonia-likely aspiration pneumonia given her CT scan findings. Continue IV Zosyn. -Seen by speech therapy and started on a pureed Diet with thin liquids as resp. Status has improved.  - Follow sputum Cx.  Slow to improve. Will get follow up CXR today.     #4 acute respiratory failure with hypoxia-this is likely secondary to pneumonia. -Continue O2 supplementation with high flow nasal cannula and wean as tolerated. Continue treatment of pneumonia as mentioned above.  #5 hypertension-continue lisinopril, metoprolol.  #6 diabetes-continue Levemir, sliding scale insulin.  #7 history of previous CVA-continue Plavix, statin.  #8 leukocytosis-likely due to infection and improving with IV antibiotic therapy. -Patient seems to have a persistent leukocytosis in the past and may benefit from outpatient hematology/oncology evaluation.  All the records are reviewed and case discussed with Care Management/Social Workerr. Management plans discussed with the patient, family and they are in agreement.  CODE STATUS: DO NOT RESUSCITATE  DVT Prophylaxis: Heparin subcutaneous  TOTAL TIME TAKING CARE OF THIS PATIENT: 25 minutes.   POSSIBLE D/C IN 2-3 DAYS, DEPENDING ON CLINICAL CONDITION.   Henreitta Leber M.D on 12/24/2014 at 10:03 AM  Between 7am to 6pm - Pager - [phone]  After 6pm go to [email or web address] - password [email or web address] [facility]  [place] [facility]  Office  [phone]  CC: Primary care physician; Alvester Chou, NP

## 2014-12-24 NOTE — Progress Notes (Signed)
Initial Nutrition Assessment    INTERVENTION:   Meals and Snacks: Cater to patient preferences Medical Food Supplement Therapy: pt reports she drinks Glucerna sometimes but does not like. Will try Mighty shakes TID with meals; asked regarding ice cream, pt reports she does not eat as it cause phlegm, will hold off on adding Magic cup at present Feeding Assistance: pt needs assistance at all meal times Coordination of Care: continue to assess for malnutrition  NUTRITION DIAGNOSIS:   Inadequate oral intake related to poor appetite, acute illness, lethargy/confusion as evidenced by meal completion < 25%.   GOAL:   Patient will meet greater than or equal to 90% of their needs   MONITOR:    (Energy Intake, Anthropometrics, Digestive System, Electrolyte/Renal profile)  REASON FOR ASSESSMENT:   Malnutrition Screening Tool (RD screen)    ASSESSMENT:    Pt admitted with AMS due to metabolic encephalopathy, pneumonia currently on HFNC. Pt is bedbound, contracted. On admission, family reporting that pt has been progressively weak/lethargic for a while.   Past Medical History  Diagnosis Date  . Stroke   . COPD (chronic obstructive pulmonary disease)   . Coronary artery disease   . Hypertension   . Diabetes mellitus type 1      Diet Order:  DIET - DYS 1 Room service appropriate?: Yes; Fluid consistency:: Thin  Energy Intake: recorded po intake sips/bites; lunch tray untouched. Spoke with Janett Billow RN, pt was assisted at lunch time but did not want to eat anything. Pt did drink some Diet Dr Malachi Bonds with SLP. Diet just advanced yesterday from NPO.   Food and Nutrition Related history: unable to assess; pt unable to give writer much of a history  Electrolyte and Renal Profile:  Recent Labs Lab 12/21/14 2258 12/23/14 0436  BUN 49* 37*  CREATININE 1.46* 1.41*  NA 136 140  K 4.7 4.0   Glucose Profile:   Recent Labs  12/23/14 1635 12/23/14 2006 12/24/14 1115  GLUCAP 104*  150* 124*   Protein Profile:   Recent Labs Lab 12/21/14 2258  ALBUMIN 2.9*   Meds: ss novolog, levemir, remeron  Last BM:  8/19   Nutrition Focused Physical Exam: Nutrition-Focused physical exam completed. Findings are mild fat depletion, mild/moderate muscle depletion, and no edema. Pt is contracted, difficult to assess legs, scapula. Pt reports she cannot use her right arm.   Height:   Ht Readings from Last 1 Encounters:  12/21/14 5\' 6"  (1.676 m)    Weight: 16% wt loss since June 29 (<2 months ago );  Wt Readings from Last 1 Encounters:  12/22/14 137 lb 11.2 oz (62.46 kg)   Wt Readings from Last 10 Encounters:  12/22/14 137 lb 11.2 oz (62.46 kg)  11/03/14 163 lb 8 oz (74.163 kg)  05/13/13 187 lb 6.4 oz (85.004 kg)    BMI:  Body mass index is 22.24 kg/(m^2).  Estimated Nutritional Needs:   Kcal:  1486-1757 kcals (BEE 1126, 1.2 AF, 1.1-1.3 IF)   Protein:  68-81 g (1.1-1.3 g/kg)   Fluid:  1550-1860 mL (25-30 ml/kg)     HIGH Care Level   Kerman Passey MS, RD, LDN [phone] Pager

## 2014-12-24 NOTE — Progress Notes (Signed)
ANTIBIOTIC CONSULT NOTE - FOLLOW UP   Pharmacy Consult for Vancomycin/Zosyn Indication: pneumonia  No Known Allergies  Patient Measurements: Height: 5\' 6"  (167.6 cm) Weight: 137 lb 11.2 oz (62.46 kg) IBW/kg (Calculated) : 59.3 Adjusted Body Weight: 73.7 kg  Vital Signs: Temp: 97.6 F (36.4 C) (08/19 0526) Temp Source: Oral (08/19 0526) BP: 127/64 mmHg (08/19 0526) Pulse Rate: 45 (08/19 0526) Intake/Output from previous day:   Intake/Output from this shift:    Labs:  Recent Labs  12/21/14 2258 12/23/14 0436 12/24/14 0835  WBC 37.7* 23.1* 18.6*  HGB 11.4* 9.8* 9.4*  PLT 501* 430 429  CREATININE 1.46* 1.41*  --    Estimated Creatinine Clearance: 31.3 mL/min (by C-G formula based on Cr of 1.41).  Recent Labs  12/24/14 0835  VANCOTROUGH 23*     Microbiology: Recent Results (from the past 720 hour(s))  Culture, blood (routine x 2)     Status: None (Preliminary result)   Collection Time: 12/21/14 11:50 PM  Result Value Ref Range Status   Specimen Description BLOOD RIGHT ARM  Final   Special Requests   Final    BOTTLES DRAWN AEROBIC AND ANAEROBIC 10MLANAEROBIC, 5MLAEROBIC   Culture  Setup Time   Final    GRAM POSITIVE COCCI IN CLUSTERS IN BOTH AEROBIC AND ANAEROBIC BOTTLES CRITICAL VALUE NOTED.  VALUE IS CONSISTENT WITH PREVIOUSLY REPORTED AND CALLED VALUE. CONFIRMED BY MPG    Culture   Final    COAGULASE NEGATIVE STAPHYLOCOCCUS IDENTIFICATION TO FOLLOW    Report Status PENDING  Incomplete  Culture, blood (routine x 2)     Status: None (Preliminary result)   Collection Time: 12/21/14 11:50 PM  Result Value Ref Range Status   Specimen Description BLOOD RIGHT ARM  Final   Special Requests   Final    BOTTLES DRAWN AEROBIC AND ANAEROBIC 5MLAEROBIC, 7MLANAEROBIC   Culture  Setup Time   Final    GRAM POSITIVE COCCI IN CLUSTERS IN BOTH AEROBIC AND ANAEROBIC BOTTLES CRITICAL RESULT CALLED TO, READ BACK BY AND VERIFIED WITH: IRIS GUIDRY @0203   12/23/14.Marland KitchenAJO CONFIRMED BY MPG    Culture   Final    COAGULASE NEGATIVE STAPHYLOCOCCUS IDENTIFICATION TO FOLLOW    Report Status PENDING  Incomplete    Medical History: Past Medical History  Diagnosis Date  . Stroke   . COPD (chronic obstructive pulmonary disease)   . Coronary artery disease   . Hypertension   . Diabetes mellitus type 1     Medications:  Infusions:    Assessment: 6 yof with AMS and cough originally ordered CTX and azithro in ED now provider broadening spectrum to cover HCAP.  Goal of Therapy:  Vancomycin trough level 15-20 mcg/ml CrCl 38 mL/min, Vd 66.7 L, Ke 0.035 hr-1, T1/2 19.5 hr.  Plan:  Expected duration 7 days with resolution of temperature and/or normalization of WBC  Received 1 gram vancomycin in ED. Kinetic based dosing should be 1.5 gm IV Q24H, will start with stacked dosing 6 hours after first dose. Zosyn 3.375 gm IV Q8H EI. Will order vancomycin trough before 4th dose.    8/19: Vancomycin level drawn while drug was infusing. Level therefore useless. Will continue current dosing of Vancomycin and will order another Vancomycin trough level prior to the 09:00 dose on 8/20.   Zosyn: Continue Zosyn 3.375 g IV q8 hours.  Kamesha Herne D, Pharm.D. Clinical Pharmacist 12/24/2014,9:29 AM

## 2014-12-24 NOTE — Care Management (Signed)
May need to consider palliative care consult for goals of treatment.  May also investigate LTAC if remains on HFNC

## 2014-12-24 NOTE — Plan of Care (Signed)
Problem: SLP Dysphagia Goals Goal: Misc Dysphagia Goal Pt will safely tolerate po diet of least restrictive consistency w/ no overt s/s of aspiration noted by Staff/pt/family x3 sessions.    

## 2014-12-25 ENCOUNTER — Inpatient Hospital Stay: Payer: Medicare Other

## 2014-12-25 LAB — CBC
HCT: 29.5 % — ABNORMAL LOW (ref 35.0–47.0)
HEMOGLOBIN: 9.3 g/dL — AB (ref 12.0–16.0)
MCH: 26.5 pg (ref 26.0–34.0)
MCHC: 31.7 g/dL — ABNORMAL LOW (ref 32.0–36.0)
MCV: 83.6 fL (ref 80.0–100.0)
PLATELETS: 426 10*3/uL (ref 150–440)
RBC: 3.53 MIL/uL — AB (ref 3.80–5.20)
RDW: 16.7 % — ABNORMAL HIGH (ref 11.5–14.5)
WBC: 15.5 10*3/uL — AB (ref 3.6–11.0)

## 2014-12-25 LAB — CREATININE, SERUM
CREATININE: 1.06 mg/dL — AB (ref 0.44–1.00)
Creatinine, Ser: 1.16 mg/dL — ABNORMAL HIGH (ref 0.44–1.00)
GFR calc Af Amer: 51 mL/min — ABNORMAL LOW (ref >60)
GFR calc non Af Amer: 44 mL/min — ABNORMAL LOW (ref >60)
GFR, EST AFRICAN AMERICAN: 57 mL/min — AB (ref >60)
GFR, EST NON AFRICAN AMERICAN: 49 mL/min — AB (ref >60)

## 2014-12-25 LAB — CULTURE, BLOOD (ROUTINE X 2)

## 2014-12-25 LAB — GLUCOSE, CAPILLARY
GLUCOSE-CAPILLARY: 53 mg/dL — AB (ref 65–99)
GLUCOSE-CAPILLARY: 68 mg/dL (ref 65–99)
GLUCOSE-CAPILLARY: 67 mg/dL (ref 65–99)
GLUCOSE-CAPILLARY: 84 mg/dL (ref 65–99)
GLUCOSE-CAPILLARY: 88 mg/dL (ref 65–99)
Glucose-Capillary: 109 mg/dL — ABNORMAL HIGH (ref 65–99)
Glucose-Capillary: 78 mg/dL (ref 65–99)

## 2014-12-25 LAB — VANCOMYCIN, TROUGH: VANCOMYCIN TR: 59 ug/mL — AB (ref 10–20)

## 2014-12-25 MED ORDER — HYDROCOD POLST-CPM POLST ER 10-8 MG/5ML PO SUER: 5.0000 mL | Freq: Two times a day (BID) | ORAL | Status: DC | PRN

## 2014-12-25 NOTE — Progress Notes (Signed)
ANTIBIOTIC CONSULT NOTE - FOLLOW UP   Pharmacy Consult for Zosyn Indication: pneumonia  No Known Allergies  Patient Measurements: Height: 5\' 6"  (167.6 cm) Weight: 137 lb 11.2 oz (62.46 kg) IBW/kg (Calculated) : 59.3 Adjusted Body Weight: 73.7 kg  Vital Signs: Temp: 97.6 F (36.4 C) (08/20 0427) Temp Source: Oral (08/20 0427) BP: 140/57 mmHg (08/20 0427) Pulse Rate: 84 (08/20 0427) Intake/Output from previous day: 08/19 0701 - 08/20 0700 In: 170 [P.O.:120; IV Piggyback:50] Out: 0  Intake/Output from this shift:    Labs:  Recent Labs  12/23/14 0436 12/24/14 0835 12/25/14 0503 12/25/14 0817  WBC 23.1* 18.6* 15.5*  --   HGB 9.8* 9.4* 9.3*  --   PLT 430 429 426  --   CREATININE 1.41*  --  1.06* 1.16*   Estimated Creatinine Clearance: 38 mL/min (by C-G formula based on Cr of 1.16).  Recent Labs  12/25/14 0817  [facility]*     Microbiology: Recent Results (from the past 720 hour(s))  Culture, blood (routine x 2)     Status: None (Preliminary result)   Collection Time: 12/21/14 11:50 PM  Result Value Ref Range Status   Specimen Description BLOOD RIGHT ARM  Final   Special Requests   Final    BOTTLES DRAWN AEROBIC AND ANAEROBIC 10MLANAEROBIC, 5MLAEROBIC   Culture  Setup Time   Final    GRAM POSITIVE COCCI IN CLUSTERS ANAEROBIC BOTTLE ONLY CRITICAL VALUE NOTED.  VALUE IS CONSISTENT WITH PREVIOUSLY REPORTED AND CALLED VALUE. CONFIRMED BY MPG    Culture   Final    COAGULASE NEGATIVE STAPHYLOCOCCUS Results consistent with contamination.    Report Status PENDING  Incomplete  Culture, blood (routine x 2)     Status: None   Collection Time: 12/21/14 11:50 PM  Result Value Ref Range Status   Specimen Description BLOOD RIGHT ARM  Final   Special Requests   Final    BOTTLES DRAWN AEROBIC AND ANAEROBIC 5MLAEROBIC, 7MLANAEROBIC   Culture  Setup Time   Final    GRAM POSITIVE COCCI IN CLUSTERS IN BOTH AEROBIC AND ANAEROBIC BOTTLES CRITICAL RESULT CALLED TO,  READ BACK BY AND VERIFIED WITH: IRIS GUIDRY @0203  12/23/14.Marland KitchenAJO CONFIRMED BY MPG    Culture STAPHYLOCOCCUS EPIDERMIDIS  Final   Report Status 12/25/2014 FINAL  Final   Organism ID, Bacteria STAPHYLOCOCCUS EPIDERMIDIS  Final      Susceptibility   Staphylococcus epidermidis - MIC*    CIPROFLOXACIN >=8 RESISTANT Resistant     ERYTHROMYCIN >=8 RESISTANT Resistant     GENTAMICIN <=0.5 SENSITIVE Sensitive     OXACILLIN Value in next row Resistant      >=4 RESISTANTWARNING: For oxacillin-resistant S.aureus and coagulase-negative staphylococci (MRS), other beta-lactam agents, ie, penicillins, beta-lactam/beta-lactamase inhibitor combinations, cephems (with the exception of the cephalosporins with anti-MRSA activity), and carbapenems, may appear active in vitro, but are not effective clinically.  --CLSI, Vol.32 No.3, January 2012, pg 70.    TETRACYCLINE Value in next row Resistant      >=4 RESISTANTWARNING: For oxacillin-resistant S.aureus and coagulase-negative staphylococci (MRS), other beta-lactam agents, ie, penicillins, beta-lactam/beta-lactamase inhibitor combinations, cephems (with the exception of the cephalosporins with anti-MRSA activity), and carbapenems, may appear active in vitro, but are not effective clinically.  --CLSI, Vol.32 No.3, January 2012, pg 70.    VANCOMYCIN Value in next row Sensitive      >=4 RESISTANTWARNING: For oxacillin-resistant S.aureus and coagulase-negative staphylococci (MRS), other beta-lactam agents, ie, penicillins, beta-lactam/beta-lactamase inhibitor combinations, cephems (with the exception of the cephalosporins with  anti-MRSA activity), and carbapenems, may appear active in vitro, but are not effective clinically.  --CLSI, Vol.32 No.3, January 2012, pg 70.    CLINDAMYCIN Value in next row Resistant      >=4 RESISTANTWARNING: For oxacillin-resistant S.aureus and coagulase-negative staphylococci (MRS), other beta-lactam agents, ie, penicillins,  beta-lactam/beta-lactamase inhibitor combinations, cephems (with the exception of the cephalosporins with anti-MRSA activity), and carbapenems, may appear active in vitro, but are not effective clinically.  --CLSI, Vol.32 No.3, January 2012, pg 70.    * STAPHYLOCOCCUS EPIDERMIDIS    Medical History: Past Medical History  Diagnosis Date  . Stroke   . COPD (chronic obstructive pulmonary disease)   . Coronary artery disease   . Hypertension   . Diabetes mellitus type 1     Medications:  Infusions:    Assessment: 54 yof with AMS and cough originally ordered CTX and azithro in ED now provider broadening spectrum to cover HCAP. Patient's vancomycin discontinued on 8/19. Patient currently on day 4 of Zosyn EI 3.375g IV Q8hr.    Plan:  Will continue patient on Zosyn EI 3.375g IV Q8hr.   Patient had elevated vanc trough of 59. Renal function appears stable and patient no longer receiving vancomycin. Will continue to monitor renal function.    Pharmacy will continue to monitor and adjust per consult.     Simpson,Michael L, Pharm.D. Clinical Pharmacist 12/25/2014,9:50 AM

## 2014-12-25 NOTE — Consult Note (Addendum)
WOC wound consult note Reason for Consult:intertriginous dermatitis in the sub-pannicular and inguinal areas.  New areas (3) noted on right foot Wound type: moisture associated dermatitis, specifically intertriginous dermatitis. Foot wounds are traumatic vs arterial insufficiency Pressure Ulcer POA: No Measurement: Right foot, 5th digit:  1cm x .6cm x 0.2cm, red and yellow base (50/50).  Right foot 4th digit:  0.8cm round with no depth, Right foot, anterior aspect at base of 5th digit:  1cm x 0.5cm x 0.2cm.  Intertriginous dermatitis is length of sub-pannicular area and bilateral inguinal areas with no break in skin Wound bed:As described above. Drainage (amount, consistency, odor) None (from any wounds) Periwound:erythematous, no induration in inguinal or sub-pannicular areas. Dressing procedure/placement/frequency: I have provided nursing with orders for the cleansing of the intertriginous areas and for the discontinuance of powders and creams in favor of the antimicrobial textile (InterDry Ag+) designed for this purpose.  Additionally and on assessment, we have identified three wounds on the right foot with arterial insufficiency vs traumatic etiology. Nursing orders have been provided for these as well as protective pressure redistribution heel boots for her use both here and following discharge. A mattress replacement is provided for moisture management as because turning and repositioning causes discomfort.  Her sacral area is unbroken at this time, but at risk. Thank you for this consultation.  The [facility] nursing team will not follow, but will remain available to this patient, the nursing and medical teams.  Please re-consult if needed. Thanks, Maudie Flakes, MSN, RN, [place], [place], [place] [phone])

## 2014-12-25 NOTE — Progress Notes (Signed)
Patient's CBG tonight is 80 mg/dL and has  10 units of Levemir to be administered, Dr. Jannifer Franklin notified and received new order to hold the Levemir. No acute distress noted. C/o mild generalized pain and received Tylenol 650 mg po PRN.

## 2014-12-25 NOTE — Progress Notes (Signed)
[facility] [place] NAME: Brenda Hogan    MR#:  [id number]  DATE OF BIRTH:  [birth year]  SUBJECTIVE:  CHIEF COMPLAINT:   Chief Complaint  Patient presents with  . Altered Mental Status   Patient here due to altered mental status and also acute respiratory failure and noted to have a aspiration pneumonia. Remains hypoxic and is on a high flow nasal cannula at 45% FiO2. Mental status much improved and back to baseline. Tolerating Pureed diet o.k.   . Somnolent today because he did not sleep last night due to cough, denies any significant sputum production. Denies any shortness of breath is comfortable on a high flow nasal cannulas.   REVIEW OF SYSTEMS:    Review of Systems  Constitutional: Negative for fever and chills.  HENT: Negative for congestion and tinnitus.   Eyes: Negative for blurred vision and double vision.  Respiratory: Positive for cough and sputum production. Negative for shortness of breath and wheezing.   Cardiovascular: Negative for chest pain, orthopnea and PND.  Gastrointestinal: Negative for nausea, vomiting, abdominal pain and diarrhea.  Genitourinary: Negative for dysuria and hematuria.  Neurological: Positive for weakness (generalized). Negative for dizziness, sensory change and focal weakness.  All other systems reviewed and are negative.   Nutrition:  Pureed w/ thin liquids Tolerating Diet: yes Tolerating PT:  Await Eval but pt. Is bedbound.   DRUG ALLERGIES:  No Known Allergies  VITALS:  Blood pressure 140/67, pulse 84, temperature 98.1 F (36.7 C), temperature source Oral, resp. rate 18, height 5' 6"  (1.676 m), weight 62.46 kg (137 lb 11.2 oz), SpO2 100 %.  PHYSICAL EXAMINATION:   Physical Exam  GENERAL:  80 y.o.-year-old patient lying in the bed in mild resp. distress. Very somnolent, although awakened and able to converse and maintain conversation. Oral mucosa is dry EYES: Pupils equal, round,  reactive to light and accommodation. No scleral icterus. Extraocular muscles intact.  HEENT: Head atraumatic, normocephalic. Oropharynx and nasopharynx clear. Moist Oral mucosa NECK:  Supple, no jugular venous distention. No thyroid enlargement, no tenderness.  LUNGS: No use of accessory muscles.  Rhonchi, crackles in right mid/lower lung fields. Moderately diminished breath sounds on the right side. Dullness to percussion CARDIOVASCULAR: S1, S2 normal. No murmurs, rubs, or gallops.  ABDOMEN: Soft, nontender, nondistended. Bowel sounds present. No organomegaly or mass.  EXTREMITIES: No cyanosis, clubbing,  +1 dependent edema b/l.    NEUROLOGIC: Cranial nerves II through XII are intact. No focal Motor or sensory deficits b/l.  Globally weak PSYCHIATRIC: The patient is alert and oriented x 3. Good affect. SKIN: No obvious rash, lesion, or ulcer.    LABORATORY PANEL:   CBC  Recent Labs Lab 12/25/14 0503  WBC 15.5*  HGB 9.3*  HCT 29.5*  PLT 426   ------------------------------------------------------------------------------------------------------------------  Chemistries   Recent Labs Lab 12/21/14 2258 12/23/14 0436  12/25/14 0817  NA 136 140  --   --   K 4.7 4.0  --   --   CL 98* 105  --   --   CO2 23 24  --   --   GLUCOSE 175* 96  --   --   BUN 49* 37*  --   --   CREATININE 1.46* 1.41*  < > 1.16*  CALCIUM 8.6* 8.3*  --   --   AST 20  --   --   --   ALT 9*  --   --   --  ALKPHOS 60  --   --   --   BILITOT 1.1  --   --   --   < > = values in this interval not displayed. ------------------------------------------------------------------------------------------------------------------  Cardiac Enzymes  Recent Labs Lab 12/21/14 2258  TROPONINI <0.03   ------------------------------------------------------------------------------------------------------------------  RADIOLOGY:  Dg Chest 1 View  12/25/2014   CLINICAL DATA:  Pneumonia  EXAM: CHEST  1 VIEW   COMPARISON:  12/21/2014, 12/22/2014  FINDINGS: Cardiac shadow is stable. Persistent increased density is noted in the right lower lobe when compare with the prior exam. Associated small bilateral pleural effusions are noted. The left effusion is new from the prior exam. Persistent abrupt cut off of the right lower lobe bronchus is noted related to mucous plugging.  IMPRESSION: Persistent right lower lobe consolidation.  New left pleural effusion.   Electronically Signed   By: Inez Catalina M.D.   On: 12/25/2014 07:22     ASSESSMENT AND PLAN:   80 year old female with past medical history of previous CVA, COPD, hypertension, diabetes, history of coronary disease, who presented to the hospital due to altered mental status and also noted to have a pneumonia.  #1 altered mental status-this is likely metabolic encephalopathy due to the pneumonia. -Patient's CT head was negative for any acute pathology. Mental status much improved and back to baseline now.  #2 sepsis-patient met criteria on admission with leukocytosis, tachycardia, tachypnea and  positive chest x-ray findings suggestive of pneumonia. -This is likely aspiration pneumonia. BC + for Coagulase (-) Staph and ?? Contaminant.  Now, off vancomycin and being continued on  Zosyn.  Remains afebrile with oxygen saturations of 100% on high flow oxygen through nasal cannula   #3 pneumonia-likely aspiration pneumonia given her CT scan findings. Continue IV Zosyn. No sputum cultures obtained unfortunately -Seen by speech therapy and started on a pureed Diet with thin liquids as resp. Status has improved.  -   Slow to improve. Repeated CXR today revealed persistent right lower lobe consolidation. No new left pleural effusion.  .     #4 acute respiratory failure with hypoxia-this is likely secondary to pneumonia. -Continue O2 supplementation with high flow nasal cannula and wean as tolerated. Continue treatment of pneumonia as mentioned above.  #5  hypertension-continue lisinopril, metoprolol.  #6 diabetes-continue Levemir, sliding scale insulin.  #7 history of previous CVA-continue Plavix, statin.  #8 leukocytosis-likely due to infection and improving with IV antibiotic therapy. -Patient seems to have a persistent leukocytosis in the past and may benefit from outpatient hematology/oncology evaluation.  All the records are reviewed and case discussed with Care Management/Social Workerr. Management plans discussed with the patient, family and they are in agreement.  CODE STATUS: DO NOT RESUSCITATE  DVT Prophylaxis: Heparin subcutaneous  TOTAL TIME TAKING CARE OF THIS PATIENT: 30 minutes.   POSSIBLE D/C IN 2-3 DAYS, DEPENDING ON CLINICAL CONDITION.   Theodoro Grist M.D on 12/25/2014 at 11:35 AM  Between 7am to 6pm - Pager - [phone]  After 6pm go to [email or web address] - password [email or web address] [facility]  [facility]  Office  [phone]  CC: Primary care physician; Alvester Chou, NP

## 2014-12-26 LAB — VANCOMYCIN, RANDOM: VANCOMYCIN RM: 36 ug/mL

## 2014-12-26 LAB — BASIC METABOLIC PANEL
Anion gap: 11 (ref 5–15)
BUN: 18 mg/dL (ref 6–20)
CHLORIDE: 106 mmol/L (ref 101–111)
CO2: 25 mmol/L (ref 22–32)
CREATININE: 1.17 mg/dL — AB (ref 0.44–1.00)
Calcium: 7.9 mg/dL — ABNORMAL LOW (ref 8.9–10.3)
GFR calc Af Amer: 51 mL/min — ABNORMAL LOW (ref >60)
GFR, EST NON AFRICAN AMERICAN: 44 mL/min — AB (ref >60)
GLUCOSE: 80 mg/dL (ref 65–99)
POTASSIUM: 3.1 mmol/L — AB (ref 3.5–5.1)
SODIUM: 142 mmol/L (ref 135–145)

## 2014-12-26 LAB — GLUCOSE, CAPILLARY
GLUCOSE-CAPILLARY: 69 mg/dL (ref 65–99)
Glucose-Capillary: 87 mg/dL (ref 65–99)
Glucose-Capillary: 100 mg/dL — ABNORMAL HIGH (ref 65–99)

## 2014-12-26 MED ORDER — INSULIN DETEMIR 100 UNIT/ML ~~LOC~~ SOLN
5.0000 [IU] | Freq: Every day | SUBCUTANEOUS | Status: DC
  Filled 2014-12-26 (×2): qty 0.05

## 2014-12-26 MED ORDER — LISINOPRIL 10 MG PO TABS
10.0000 mg | ORAL_TABLET | Freq: Every day | ORAL | Status: DC
  Administered 2014-12-27 – 2014-12-28 (×2): 10 mg via ORAL
  Filled 2014-12-26 (×2): qty 1

## 2014-12-26 NOTE — Progress Notes (Signed)
ANTIBIOTIC CONSULT NOTE - INITIAL  Pharmacy Consult for Vancomycin Indication: + Blood cultures with MRSE  No Known Allergies  Patient Measurements: Height: 5\' 6"  (167.6 cm) Weight: 145 lb 4.5 oz (65.9 kg) IBW/kg (Calculated) : 59.3 Adjusted Body Weight: n/a  Vital Signs: BP: 151/67 mmHg (08/21 1126) Pulse Rate: 53 (08/21 1126) Intake/Output from previous day: 08/20 0701 - 08/21 0700 In: 50 [IV Piggyback:50] Out: -  Intake/Output from this shift:    Labs:  Recent Labs  12/24/14 0835 12/25/14 0503 12/25/14 0817 12/26/14 0442  WBC 18.6* 15.5*  --   --   HGB 9.4* 9.3*  --   --   PLT 429 426  --   --   CREATININE  --  1.06* 1.16* 1.17*   Estimated Creatinine Clearance: 37.7 mL/min (by C-G formula based on Cr of 1.17).  Recent Labs  12/25/14 0817 12/26/14 1727  VANCOTROUGH 60*  --   VANCORANDOM  --  51     Microbiology: Recent Results (from the past 720 hour(s))  Culture, blood (routine x 2)     Status: None (Preliminary result)   Collection Time: 12/21/14 11:50 PM  Result Value Ref Range Status   Specimen Description BLOOD RIGHT ARM  Final   Special Requests   Final    BOTTLES DRAWN AEROBIC AND ANAEROBIC 10MLANAEROBIC, 5MLAEROBIC   Culture  Setup Time   Final    GRAM POSITIVE COCCI IN CLUSTERS ANAEROBIC BOTTLE ONLY CRITICAL VALUE NOTED.  VALUE IS CONSISTENT WITH PREVIOUSLY REPORTED AND CALLED VALUE. CONFIRMED BY MPG    Culture   Final    COAGULASE NEGATIVE STAPHYLOCOCCUS ANAEROBIC BOTTLE ONLY    Report Status PENDING  Incomplete  Culture, blood (routine x 2)     Status: None   Collection Time: 12/21/14 11:50 PM  Result Value Ref Range Status   Specimen Description BLOOD RIGHT ARM  Final   Special Requests   Final    BOTTLES DRAWN AEROBIC AND ANAEROBIC 5MLAEROBIC, 7MLANAEROBIC   Culture  Setup Time   Final    GRAM POSITIVE COCCI IN CLUSTERS IN BOTH AEROBIC AND ANAEROBIC BOTTLES CRITICAL RESULT CALLED TO, READ BACK BY AND VERIFIED WITH: IRIS GUIDRY  @0203  12/23/14.Marland KitchenAJO CONFIRMED BY MPG    Culture STAPHYLOCOCCUS EPIDERMIDIS  Final   Report Status 12/25/2014 FINAL  Final   Organism ID, Bacteria STAPHYLOCOCCUS EPIDERMIDIS  Final      Susceptibility   Staphylococcus epidermidis - MIC*    CIPROFLOXACIN >=8 RESISTANT Resistant     ERYTHROMYCIN >=8 RESISTANT Resistant     GENTAMICIN <=0.5 SENSITIVE Sensitive     OXACILLIN Value in next row Resistant      >=4 RESISTANTWARNING: For oxacillin-resistant S.aureus and coagulase-negative staphylococci (MRS), other beta-lactam agents, ie, penicillins, beta-lactam/beta-lactamase inhibitor combinations, cephems (with the exception of the cephalosporins with anti-MRSA activity), and carbapenems, may appear active in vitro, but are not effective clinically.  --CLSI, Vol.32 No.3, January 2012, pg 70.    TETRACYCLINE Value in next row Resistant      >=4 RESISTANTWARNING: For oxacillin-resistant S.aureus and coagulase-negative staphylococci (MRS), other beta-lactam agents, ie, penicillins, beta-lactam/beta-lactamase inhibitor combinations, cephems (with the exception of the cephalosporins with anti-MRSA activity), and carbapenems, may appear active in vitro, but are not effective clinically.  --CLSI, Vol.32 No.3, January 2012, pg 70.    VANCOMYCIN Value in next row Sensitive      >=4 RESISTANTWARNING: For oxacillin-resistant S.aureus and coagulase-negative staphylococci (MRS), other beta-lactam agents, ie, penicillins, beta-lactam/beta-lactamase inhibitor combinations, cephems (with the exception  of the cephalosporins with anti-MRSA activity), and carbapenems, may appear active in vitro, but are not effective clinically.  --CLSI, Vol.32 No.3, January 2012, pg 70.    CLINDAMYCIN Value in next row Resistant      >=4 RESISTANTWARNING: For oxacillin-resistant S.aureus and coagulase-negative staphylococci (MRS), other beta-lactam agents, ie, penicillins, beta-lactam/beta-lactamase inhibitor combinations, cephems (with  the exception of the cephalosporins with anti-MRSA activity), and carbapenems, may appear active in vitro, but are not effective clinically.  --CLSI, Vol.32 No.3, January 2012, pg 70.    * STAPHYLOCOCCUS EPIDERMIDIS    Medical History: Past Medical History  Diagnosis Date  . Stroke   . COPD (chronic obstructive pulmonary disease)   . Coronary artery disease   . Hypertension   . Diabetes mellitus type 1     Medications:  Anti-infectives    Start     Dose/Rate Route Frequency Ordered Stop   12/22/14 0900  vancomycin (VANCOCIN) 1,500 mg in sodium chloride 0.9 % 500 mL IVPB  Status:  Discontinued     1,500 mg 250 mL/hr over 120 Minutes Intravenous Every 24 hours 12/22/14 0346 12/24/14 1006   12/22/14 0415  piperacillin-tazobactam (ZOSYN) IVPB 3.375 g     3.375 g 12.5 mL/hr over 240 Minutes Intravenous 3 times per day 12/22/14 0346     12/22/14 0315  piperacillin-tazobactam (ZOSYN) IVPB 3.375 g  Status:  Discontinued     3.375 g 12.5 mL/hr over 240 Minutes Intravenous  Once 12/22/14 0203 12/22/14 0402   12/22/14 0215  vancomycin (VANCOCIN) IVPB 1000 mg/200 mL premix     1,000 mg 200 mL/hr over 60 Minutes Intravenous  Once 12/22/14 0203 12/22/14 0344   12/22/14 0200  clindamycin (CLEOCIN) IVPB 600 mg  Status:  Discontinued     600 mg 100 mL/hr over 30 Minutes Intravenous  Once 12/22/14 0146 12/22/14 0203   12/21/14 2345  cefTRIAXone (ROCEPHIN) 2 g in dextrose 5 % 50 mL IVPB     2 g 100 mL/hr over 30 Minutes Intravenous  Once 12/21/14 2342 12/22/14 0148   12/21/14 2330  cefTRIAXone (ROCEPHIN) 1 g in dextrose 5 % 50 mL IVPB  Status:  Discontinued     1 g 100 mL/hr over 30 Minutes Intravenous  Once 12/21/14 2323 12/21/14 2342   12/21/14 2330  azithromycin (ZITHROMAX) 500 mg in dextrose 5 % 250 mL IVPB     500 mg 250 mL/hr over 60 Minutes Intravenous  Once 12/21/14 2323 12/22/14 0118     Assessment: Patient with MRSE in blood cultures, orders to restart Vancomycin. Last dose of  Vancomycin was given on 8/19. Vancomycin trough level drawn on 8/20 was elevated at 59, renal function appears stable in fact it is improved from admission. Order random Vanc trough for 17:00 to assess level prior to restarting. Random vanc level resulted at 22mcg/ml.  Goal of Therapy:  Vancomycin trough level 15-20 mcg/ml  Plan:  Follow up culture results Will not order any Vancomycin at present as patient still with drug in system. Will recheck Vancomycin level on 8/22 and follow up on repeat cultures.  Brenda Hogan, PharmD, BCPS 12/26/2014 7:16 PM

## 2014-12-26 NOTE — Progress Notes (Signed)
MD, Dr. Jannifer Franklin notified for sugar of 100. Levemir 5 units ordered at bedtime. MD ordered to hold evening dose.  Will continue to monitor. Jessee Avers

## 2014-12-26 NOTE — Progress Notes (Addendum)
[facility] NAME: Brenda Hogan    MR#:  [id number]  DATE OF BIRTH:  June 21, 1934  SUBJECTIVE:  CHIEF COMPLAINT:   Chief Complaint  Patient presents with  . Altered Mental Status   Patient here due to altered mental status and also acute respiratory failure and noted to have a aspiration pneumonia. Remains hypoxic, but improving  and is on a high flow nasal cannula at 37% FiO2. Mental status improved and back to baseline. Tolerating Pureed diet o.k. . Feels satisfactory today. Denies any significant discomfort. Wants to go home. Wound care came in , recognized pressure ulcerations in R foot, boots recommended, also intertriginous dermatitis care.   REVIEW OF SYSTEMS:    Review of Systems  Constitutional: Negative for fever and chills.  HENT: Negative for congestion and tinnitus.   Eyes: Negative for blurred vision and double vision.  Respiratory: Positive for cough and sputum production. Negative for shortness of breath and wheezing.   Cardiovascular: Negative for chest pain, orthopnea and PND.  Gastrointestinal: Negative for nausea, vomiting, abdominal pain and diarrhea.  Genitourinary: Negative for dysuria and hematuria.  Neurological: Positive for weakness (generalized). Negative for dizziness, sensory change and focal weakness.  All other systems reviewed and are negative.   Nutrition:  Pureed w/ thin liquids Tolerating Diet: yes Tolerating PT:  Await Eval but pt. Is bedbound.   DRUG ALLERGIES:  No Known Allergies  VITALS:  Blood pressure 151/67, pulse 53, temperature 97.7 F (36.5 C), temperature source Oral, resp. rate 18, height 5' 6"  (1.676 m), weight 65.9 kg (145 lb 4.5 oz), SpO2 99 %.  PHYSICAL EXAMINATION:   Physical Exam  GENERAL:  80 y.o.-year-old patient lying in the bed in no resp. Distress.More alert today, converses.  EYES: Pupils equal, round, reactive to light and accommodation. No scleral  icterus. Extraocular muscles intact.  HEENT: Head atraumatic, normocephalic. Oropharynx and nasopharynx clear. Moist Oral mucosa NECK:  Supple, no jugular venous distention. No thyroid enlargement, no tenderness.  LUNGS: No use of accessory muscles.  Rhonchi, crackles in right mid/lower lung fields. Moderately diminished breath sounds on the right side. Dullness to percussion CARDIOVASCULAR: S1, S2 normal. No murmurs, rubs, or gallops.  ABDOMEN: Soft, nontender, nondistended. Bowel sounds present. No organomegaly or mass.  EXTREMITIES: No cyanosis, clubbing,  +1 dependent edema b/l.    NEUROLOGIC: Cranial nerves II through XII are intact. No focal Motor or sensory deficits b/l.  Globally weak PSYCHIATRIC: The patient is alert and oriented x 3. Good affect. SKIN: No obvious rash, lesion, or ulcer. R foot little toe area is reddened, small wounds, also ion 4th toe. Intertriginous dermatitis in inguinal, sub pannicular area.    LABORATORY PANEL:   CBC  Recent Labs Lab 12/25/14 0503  WBC 15.5*  HGB 9.3*  HCT 29.5*  PLT 426   ------------------------------------------------------------------------------------------------------------------  Chemistries   Recent Labs Lab 12/21/14 2258  12/26/14 0442  NA 136  < > 142  K 4.7  < > 3.1*  CL 98*  < > 106  CO2 23  < > 25  GLUCOSE 175*  < > 80  BUN 49*  < > 18  CREATININE 1.46*  < > 1.17*  CALCIUM 8.6*  < > 7.9*  AST 20  --   --   ALT 9*  --   --   ALKPHOS 60  --   --   BILITOT 1.1  --   --   < > =  values in this interval not displayed. ------------------------------------------------------------------------------------------------------------------  Cardiac Enzymes  Recent Labs Lab 12/21/14 2258  TROPONINI <0.03   ------------------------------------------------------------------------------------------------------------------  RADIOLOGY:  Dg Chest 1 View  12/25/2014   CLINICAL DATA:  Pneumonia  EXAM: CHEST  1 VIEW   COMPARISON:  12/21/2014, 12/22/2014  FINDINGS: Cardiac shadow is stable. Persistent increased density is noted in the right lower lobe when compare with the prior exam. Associated small bilateral pleural effusions are noted. The left effusion is new from the prior exam. Persistent abrupt cut off of the right lower lobe bronchus is noted related to mucous plugging.  IMPRESSION: Persistent right lower lobe consolidation.  New left pleural effusion.   Electronically Signed   By: Inez Catalina M.D.   On: 12/25/2014 07:22     ASSESSMENT AND PLAN:   80 year old female with past medical history of previous CVA, COPD, hypertension, diabetes, history of coronary disease, who presented to the hospital due to altered mental status and also noted to have a pneumonia.  #1 altered mental status-this is likely metabolic encephalopathy due to the pneumonia. -Patient's CT head was negative for any acute pathology. Mental status much improved and back to baseline now.  #2 sepsis-patient met criteria on admission with leukocytosis, tachycardia, tachypnea and  positive chest x-ray findings suggestive of pneumonia. -This is likely aspiration pneumonia. BC + for Coagulase (-) Staph and ?? Contaminant.  Now, off vancomycin and being continued on  Zosyn.  Remains afebrile with oxygen saturations of 95-99 % on high flow oxygen through nasal cannula , oxygen requirement is decreasing, being weaned off O2, now on 37% via HFNC  #3 pneumonia-likely aspiration pneumonia given her CT scan findings. Continue IV Zosyn. No sputum cultures obtained unfortunately -Seen by speech therapy and started on a pureed Diet with thin liquids as resp. Status has improved.  Slow to improve. Repeated CXR yesterday revealed persistent right lower lobe consolidation, also new left pleural effusion, off IVF now    .     #4 acute respiratory failure with hypoxia-this is likely secondary to pneumonia. -Continue O2 supplementation with high flow nasal  cannula and wean as tolerated. Continue treatment of pneumonia as mentioned above.  #5 hypertension-continue lisinopril, metoprolol. Advanced dose  #6 diabetes-continue Levemir, sliding scale insulin. Blood glucose level is 69 today in the morning, we will decrease Levemir dose  #7 history of previous CVA-continue Plavix, statin.  #8 leukocytosis-likely due to infection and improving with IV antibiotic therapy. -Patient seems to have a persistent leukocytosis in the past and may benefit from outpatient hematology/oncology evaluation. Get flow cytometry, refer to outpatient cancer Center if abnormal  All the records are reviewed and case discussed with Care Management/Social Workerr. Management plans discussed with the patient, family and they are in agreement.  CODE STATUS: DO NOT RESUSCITATE  DVT Prophylaxis: Heparin subcutaneous  TOTAL TIME TAKING CARE OF THIS PATIENT: 45 minutes.  Discussed with patient's daughter Ligia Duguay, all questions answered, voiced understanding, time spent about 10-15 minutes on discussion POSSIBLE D/C IN 2-3 DAYS, DEPENDING ON CLINICAL CONDITION.   Theodoro Grist M.D on 12/26/2014 at 11:44 AM  Between 7am to 6pm - Pager - [phone]  After 6pm go to [email or web address] - password [email or web address] [facility]  [facility]  Office  [phone]  CC: Primary care physician; Alvester Chou, NP

## 2014-12-27 LAB — CULTURE, BLOOD (ROUTINE X 2)

## 2014-12-27 LAB — GLUCOSE, CAPILLARY
GLUCOSE-CAPILLARY: 76 mg/dL (ref 65–99)
Glucose-Capillary: 94 mg/dL (ref 65–99)
Glucose-Capillary: 101 mg/dL — ABNORMAL HIGH (ref 65–99)

## 2014-12-27 LAB — VANCOMYCIN, RANDOM: Vancomycin Rm: 23 ug/mL

## 2014-12-27 NOTE — Care Management Important Message (Signed)
Important Message  Patient Details  Name: Brenda Hogan MRN: [id number] Date of Birth: July 10, 1934   Medicare Important Message Given:  Yes-third notification given    Darius Bump Allmond 12/27/2014, 10:23 AM

## 2014-12-27 NOTE — Progress Notes (Signed)
ANTIBIOTIC CONSULT NOTE - INITIAL  Pharmacy Consult for Vancomycin Indication: + Blood cultures with MRSE  No Known Allergies  Patient Measurements: Height: 5\' 6"  (167.6 cm) Weight: 145 lb 4.5 oz (65.9 kg) IBW/kg (Calculated) : 59.3 Adjusted Body Weight: n/a  Vital Signs: Temp: 98.7 F (37.1 C) (08/22 2043) Temp Source: Oral (08/22 2043) BP: 157/61 mmHg (08/22 2043) Pulse Rate: 69 (08/22 2051) Intake/Output from previous day:   Intake/Output from this shift:    Labs:  Recent Labs  12/25/14 0503 12/25/14 0817 12/26/14 0442  WBC 15.5*  --   --   HGB 9.3*  --   --   PLT 426  --   --   CREATININE 1.06* 1.16* 1.17*   Estimated Creatinine Clearance: 37.7 mL/min (by C-G formula based on Cr of 1.17).  Recent Labs  12/25/14 0817 12/26/14 1727 12/27/14 1913  VANCOTROUGH 57*  --   --   VANCORANDOM  --  36 23     Microbiology: Recent Results (from the past 720 hour(s))  Culture, blood (routine x 2)     Status: None   Collection Time: 12/21/14 11:50 PM  Result Value Ref Range Status   Specimen Description BLOOD RIGHT ARM  Final   Special Requests   Final    BOTTLES DRAWN AEROBIC AND ANAEROBIC 10MLANAEROBIC, 5MLAEROBIC   Culture  Setup Time   Final    GRAM POSITIVE COCCI IN CLUSTERS ANAEROBIC BOTTLE ONLY CRITICAL VALUE NOTED.  VALUE IS CONSISTENT WITH PREVIOUSLY REPORTED AND CALLED VALUE. CONFIRMED BY [facility]    Culture   Final    COAGULASE NEGATIVE STAPHYLOCOCCUS ANAEROBIC BOTTLE ONLY Results consistent with contamination.    Report Status 12/27/2014 FINAL  Final  Culture, blood (routine x 2)     Status: None   Collection Time: 12/21/14 11:50 PM  Result Value Ref Range Status   Specimen Description BLOOD RIGHT ARM  Final   Special Requests   Final    BOTTLES DRAWN AEROBIC AND ANAEROBIC 5MLAEROBIC, 7MLANAEROBIC   Culture  Setup Time   Final    GRAM POSITIVE COCCI IN CLUSTERS IN BOTH AEROBIC AND ANAEROBIC BOTTLES CRITICAL RESULT CALLED TO, READ BACK BY AND  VERIFIED WITH: IRIS GUIDRY @0203  12/23/14.Marland KitchenAJO CONFIRMED BY [facility]    Culture STAPHYLOCOCCUS EPIDERMIDIS  Final   Report Status 12/25/2014 FINAL  Final   Organism ID, Bacteria STAPHYLOCOCCUS EPIDERMIDIS  Final      Susceptibility   Staphylococcus epidermidis - MIC*    CIPROFLOXACIN >=8 RESISTANT Resistant     ERYTHROMYCIN >=8 RESISTANT Resistant     GENTAMICIN <=0.5 SENSITIVE Sensitive     OXACILLIN Value in next row Resistant      >=4 RESISTANTWARNING: For oxacillin-resistant S.aureus and coagulase-negative staphylococci (MRS), other beta-lactam agents, ie, penicillins, beta-lactam/beta-lactamase inhibitor combinations, cephems (with the exception of the cephalosporins with anti-MRSA activity), and carbapenems, may appear active in vitro, but are not effective clinically.  --CLSI, Vol.32 No.3, January 2012, pg 70.    TETRACYCLINE Value in next row Resistant      >=4 RESISTANTWARNING: For oxacillin-resistant S.aureus and coagulase-negative staphylococci (MRS), other beta-lactam agents, ie, penicillins, beta-lactam/beta-lactamase inhibitor combinations, cephems (with the exception of the cephalosporins with anti-MRSA activity), and carbapenems, may appear active in vitro, but are not effective clinically.  --CLSI, Vol.32 No.3, January 2012, pg 70.    VANCOMYCIN Value in next row Sensitive      >=4 RESISTANTWARNING: For oxacillin-resistant S.aureus and coagulase-negative staphylococci (MRS), other beta-lactam agents, ie, penicillins, beta-lactam/beta-lactamase inhibitor combinations, cephems (  with the exception of the cephalosporins with anti-MRSA activity), and carbapenems, may appear active in vitro, but are not effective clinically.  --CLSI, Vol.32 No.3, January 2012, pg 70.    CLINDAMYCIN Value in next row Resistant      >=4 RESISTANTWARNING: For oxacillin-resistant S.aureus and coagulase-negative staphylococci (MRS), other beta-lactam agents, ie, penicillins, beta-lactam/beta-lactamase inhibitor  combinations, cephems (with the exception of the cephalosporins with anti-MRSA activity), and carbapenems, may appear active in vitro, but are not effective clinically.  --CLSI, Vol.32 No.3, January 2012, pg 70.    * STAPHYLOCOCCUS EPIDERMIDIS  Culture, blood (routine x 2)     Status: None (Preliminary result)   Collection Time: 12/26/14  5:27 PM  Result Value Ref Range Status   Specimen Description BLOOD LEFT HAND  Final   Special Requests   Final    BOTTLES DRAWN AEROBIC AND ANAEROBIC  AER 4CC ANA 6CC   Culture NO GROWTH < 24 HOURS  Final   Report Status PENDING  Incomplete  Culture, blood (routine x 2)     Status: None (Preliminary result)   Collection Time: 12/26/14  5:33 PM  Result Value Ref Range Status   Specimen Description BLOOD RIGHT HAND  Final   Special Requests BOTTLES DRAWN AEROBIC AND ANAEROBIC  [place]  Final   Culture NO GROWTH < 24 HOURS  Final   Report Status PENDING  Incomplete    Medical History: Past Medical History  Diagnosis Date  . Stroke   . COPD (chronic obstructive pulmonary disease)   . Coronary artery disease   . Hypertension   . Diabetes mellitus type 1     Medications:  Anti-infectives    Start     Dose/Rate Route Frequency Ordered Stop   12/22/14 0900  vancomycin (VANCOCIN) 1,500 mg in sodium chloride 0.9 % 500 mL IVPB  Status:  Discontinued     1,500 mg 250 mL/hr over 120 Minutes Intravenous Every 24 hours 12/22/14 0346 12/24/14 1006   12/22/14 0415  piperacillin-tazobactam (ZOSYN) IVPB 3.375 g     3.375 g 12.5 mL/hr over 240 Minutes Intravenous 3 times per day 12/22/14 0346     12/22/14 0315  piperacillin-tazobactam (ZOSYN) IVPB 3.375 g  Status:  Discontinued     3.375 g 12.5 mL/hr over 240 Minutes Intravenous  Once 12/22/14 0203 12/22/14 0402   12/22/14 0215  vancomycin (VANCOCIN) IVPB 1000 mg/200 mL premix     1,000 mg 200 mL/hr over 60 Minutes Intravenous  Once 12/22/14 0203 12/22/14 0344   12/22/14 0200  clindamycin (CLEOCIN) IVPB 600 mg   Status:  Discontinued     600 mg 100 mL/hr over 30 Minutes Intravenous  Once 12/22/14 0146 12/22/14 0203   12/21/14 2345  cefTRIAXone (ROCEPHIN) 2 g in dextrose 5 % 50 mL IVPB     2 g 100 mL/hr over 30 Minutes Intravenous  Once 12/21/14 2342 12/22/14 0148   12/21/14 2330  cefTRIAXone (ROCEPHIN) 1 g in dextrose 5 % 50 mL IVPB  Status:  Discontinued     1 g 100 mL/hr over 30 Minutes Intravenous  Once 12/21/14 2323 12/21/14 2342   12/21/14 2330  azithromycin (ZITHROMAX) 500 mg in dextrose 5 % 250 mL IVPB     500 mg 250 mL/hr over 60 Minutes Intravenous  Once 12/21/14 2323 12/22/14 0118     Assessment: Patient with MRSE in blood cultures, orders to restart Vancomycin. Last dose of Vancomycin was given on 8/19. Vancomycin trough level drawn on 8/20 was elevated at  59, renal function appears stable in fact it is improved from admission. Order random Vanc trough for 17:00 to assess level prior to restarting. Random vanc level resulted at 69mcg/ml.  Goal of Therapy:  Vancomycin trough level 15-20 mcg/ml  Plan:  Follow up culture results Will not order any Vancomycin at present as patient still with drug in system. Will recheck Vancomycin level on 8/22 and follow up on repeat cultures.   8/22 Random Vanc trough = 23 mcg/ml.  Reordered random level for 8/23 at 07:00.    Olivia Canter, [facility] 12/27/2014 9:00 PM

## 2014-12-27 NOTE — Progress Notes (Signed)
[facility] [place] NAME: Brenda Hogan    MR#:  [id number]  DATE OF BIRTH:  [birth year]  SUBJECTIVE:  CHIEF COMPLAINT:   Chief Complaint  Patient presents with  . Altered Mental Status   Patient here due to altered mental status and also acute respiratory failure and noted to have a aspiration pneumonia. Remains hypoxic, but improving  and is on a 3.5 L O2 via nasal cannula. Mental status improved and seems close to baseline. Tolerating Pureed diet but sugars seem to be low.  REVIEW OF SYSTEMS:    Review of Systems  Constitutional: Negative for fever and chills.  HENT: Negative for congestion and tinnitus.   Eyes: Negative for blurred vision and double vision.  Respiratory: Positive for cough and sputum production. Negative for shortness of breath and wheezing.   Cardiovascular: Negative for chest pain, orthopnea and PND.  Gastrointestinal: Negative for nausea, vomiting, abdominal pain and diarrhea.  Genitourinary: Negative for dysuria and hematuria.  Neurological: Positive for weakness (generalized). Negative for dizziness, sensory change and focal weakness.  All other systems reviewed and are negative.   Nutrition:  Pureed w/ thin liquids Tolerating Diet: yes Tolerating PT:  Await Eval but pt. Is bedbound.   DRUG ALLERGIES:  No Known Allergies  VITALS:  Blood pressure 132/73, pulse 56, temperature 98.4 F (36.9 C), temperature source Oral, resp. rate 20, height 5' 6"  (1.676 m), weight 65.9 kg (145 lb 4.5 oz), SpO2 99 %. PHYSICAL EXAMINATION:   Physical Exam  GENERAL:  80 y.o.-year-old patient lying in the bed in no resp. Distress.More alert today, converses.  EYES: Pupils equal, round, reactive to light and accommodation. No scleral icterus. Extraocular muscles intact.  HEENT: Head atraumatic, normocephalic. Oropharynx and nasopharynx clear. Moist Oral mucosa NECK:  Supple, no jugular venous distention. No thyroid  enlargement, no tenderness.  LUNGS: No use of accessory muscles.  Rhonchi, crackles in right mid/lower lung fields. Moderately diminished breath sounds on the right side. Dullness to percussion CARDIOVASCULAR: S1, S2 normal. No murmurs, rubs, or gallops.  ABDOMEN: Soft, nontender, nondistended. Bowel sounds present. No organomegaly or mass.  EXTREMITIES: No cyanosis, clubbing,  +1 dependent edema b/l.    NEUROLOGIC: Cranial nerves II through XII are intact. No focal Motor or sensory deficits b/l.  Globally weak PSYCHIATRIC: The patient is alert and oriented x 3. Good affect. SKIN: No obvious rash, lesion, or ulcer. R foot little toe area is reddened, small wounds, also ion 4th toe. Intertriginous dermatitis in inguinal, sub pannicular area.    LABORATORY PANEL:   CBC  Recent Labs Lab 12/25/14 0503  WBC 15.5*  HGB 9.3*  HCT 29.5*  PLT 426   ------------------------------------------------------------------------------------------------------------------  Chemistries   Recent Labs Lab 12/21/14 2258  12/26/14 0442  NA 136  < > 142  K 4.7  < > 3.1*  CL 98*  < > 106  CO2 23  < > 25  GLUCOSE 175*  < > 80  BUN 49*  < > 18  CREATININE 1.46*  < > 1.17*  CALCIUM 8.6*  < > 7.9*  AST 20  --   --   ALT 9*  --   --   ALKPHOS 60  --   --   BILITOT 1.1  --   --   < > = values in this interval not displayed. ------------------------------------------------------------------------------------------------------------------  Cardiac Enzymes  Recent Labs Lab 12/21/14 2258  TROPONINI <0.03   ------------------------------------------------------------------------------------------------------------------  RADIOLOGY:  No results found.   ASSESSMENT AND PLAN:   80 year old female with past medical history of previous CVA, COPD, hypertension, diabetes, history of coronary disease, who presented to the hospital due to altered mental status and also noted to have a pneumonia.  #1  altered mental status-this is likely metabolic encephalopathy due to the pneumonia. -Patient's CT head was negative for any acute pathology. Mental status much improved and back to baseline now.  #2 sepsis-patient met criteria on admission with leukocytosis, tachycardia, tachypnea and  positive chest x-ray findings suggestive of pneumonia. -This is likely aspiration pneumonia. BC + for Coagulase (-) Staph and ?? Contaminant.  Now, off vancomycin and being continued on  Zosyn.  Remains afebrile with oxygen saturations of 95-99 % on high flow oxygen through nasal cannula , oxygen requirement is decreasing, being weaned off O2, now on 37% via HFNC  #3 pneumonia-likely aspiration pneumonia given her CT scan findings. Continue IV Zosyn. No sputum cultures obtained unfortunately -Seen by speech therapy and started on a pureed Diet with thin liquids as resp. Status has improved.  Slow to improve. Repeated CXR yesterday revealed persistent right lower lobe consolidation, also new left pleural effusion, off IVF now    .     #4 acute respiratory failure with hypoxia-this is likely secondary to pneumonia. -Continue O2 supplementation with high flow nasal cannula and wean as tolerated. Continue treatment of pneumonia as mentioned above.  #5 hypertension-continue lisinopril, metoprolol. Advanced dose  #6 diabetes- on sliding scale insulin. Blood glucose level continues to run low stop Levemir  #7 history of previous CVA-continue Plavix, statin.  #8 leukocytosis-likely due to infection and improving with IV antibiotic therapy. -Patient seems to have a persistent leukocytosis in the past and may benefit from outpatient hematology/oncology evaluation. Get flow cytometry, refer to outpatient cancer Center if abnormal  All the records are reviewed and case discussed with Care Management/Social Worker.  CM recommends palliative care c/s to discuss goals of care with family. Will c/s them.  CODE STATUS: DO NOT  RESUSCITATE  DVT Prophylaxis: Heparin subcutaneous  TOTAL TIME TAKING CARE OF THIS PATIENT: 25 minutes.   POSSIBLE D/C IN 1-2 DAYS, DEPENDING ON CLINICAL CONDITION.   [facility], Salam Chesterfield M.D on 12/27/2014 at 4:36 PM  Between 7am to 6pm - Pager - [phone]  After 6pm go to [email or web address] - password [email or web address] [facility]  [facility]  Office  [phone]  CC: Primary care physician; Alvester Chou, NP

## 2014-12-27 NOTE — Progress Notes (Addendum)
Pt rested well during shift.  Pt incontinent during shift.  Pt refused turns while asleep.  IV abx given.  Pt repositioned often prior to sleep- pt says her bottom can never get comfortable.  Will continue to monitor. Jessee Avers

## 2014-12-27 NOTE — Consult Note (Signed)
[facility] Infectious Disease     Reason for Consult: Sepsis, bacteremia, Aspiration PNA   Referring Physician: Max Sane Date of Admission:  12/21/2014   Active Problems:   Sepsis   Acute respiratory insufficiency   HCAP (healthcare-associated pneumonia)   HPI: Brenda Hogan is a 80 y.o. female with a known history of COPD non-oxygen dependent, type 2 diabetes uncomplicated, hypertension, CVA admitted with cough, altered mental status, PNA and sepsis. Had been ill for abotu 2 weeks- started on oral abx as otpt prior to admit.  Is supposed to be on nectar thick diet but noncompliant.  Has been on zosyn, and vanco (dced today). Bcx 1/2 with CNS. WBC was 37 K but down to 15.  Afebrile. Weaning off O2.  Past Medical History  Diagnosis Date  . Stroke   . COPD (chronic obstructive pulmonary disease)   . Coronary artery disease   . Hypertension   . Diabetes mellitus type 1    Past Surgical History  Procedure Laterality Date  . Total knee arthroplasty Bilateral    Social History  Substance Use Topics  . Smoking status: Former Research scientist (life sciences)  . Smokeless tobacco: Never Used  . Alcohol Use: No   Family History  Problem Relation Age of Onset  . CAD      Allergies: No Known Allergies  Current antibiotics: Antibiotics Given (last 72 hours)    Date/Time Action Medication Dose Rate   12/24/14 2210 Given   piperacillin-tazobactam (ZOSYN) IVPB 3.375 g 3.375 g 12.5 mL/hr   12/25/14 0449 Given   piperacillin-tazobactam (ZOSYN) IVPB 3.375 g 3.375 g 12.5 mL/hr   12/25/14 1147 Given   piperacillin-tazobactam (ZOSYN) IVPB 3.375 g 3.375 g 12.5 mL/hr   12/25/14 2215 Given   piperacillin-tazobactam (ZOSYN) IVPB 3.375 g 3.375 g 12.5 mL/hr   12/26/14 0511 Given   piperacillin-tazobactam (ZOSYN) IVPB 3.375 g 3.375 g 12.5 mL/hr   12/26/14 1240 Given   piperacillin-tazobactam (ZOSYN) IVPB 3.375 g 3.375 g 12.5 mL/hr   12/26/14 2119 Given   piperacillin-tazobactam (ZOSYN) IVPB 3.375 g  3.375 g 12.5 mL/hr   12/27/14 0542 Given   piperacillin-tazobactam (ZOSYN) IVPB 3.375 g 3.375 g 12.5 mL/hr   12/27/14 1414 Given   piperacillin-tazobactam (ZOSYN) IVPB 3.375 g 3.375 g 12.5 mL/hr      MEDICATIONS: . antiseptic oral rinse  7 mL Mouth Rinse q12n4p  . chlorhexidine  15 mL Mouth Rinse BID  . clopidogrel  75 mg Oral Daily  . docusate sodium  100 mg Oral BID  . fluticasone  1 spray Each Nare Daily  . heparin  5,000 Units Subcutaneous 3 times per day  . insulin aspart  0-15 Units Subcutaneous TID WC  . insulin aspart  0-5 Units Subcutaneous QHS  . insulin detemir  5 Units Subcutaneous QHS  . lisinopril  10 mg Oral Daily  . metoprolol  50 mg Oral Daily  . mirtazapine  15 mg Oral QHS  . piperacillin-tazobactam (ZOSYN)  IV  3.375 g Intravenous 3 times per day  . rosuvastatin  10 mg Oral QHS    Review of Systems - 11 systems reviewed and negative per HPI   OBJECTIVE: Temp:  [97.8 F (36.6 C)-98.4 F (36.9 C)] 98.4 F (36.9 C) (08/22 1120) Pulse Rate:  [56-79] 56 (08/22 1120) Resp:  [16-22] 20 (08/22 1120) BP: (132-148)/(50-73) 132/73 mmHg (08/22 1120) SpO2:  [97 %-100 %] 99 % (08/22 1120) FiO2 (%):  [32 %] 32 % (08/22 0210)  Physical Exam  Constitutional:  frail.  HENT: [place]/AT, PERRLA, no scleral icterus Mouth/Throat: Oropharynx is clear and dry. No oropharyngeal exudate.  Cardiovascular: Normal rate, regular rhythm and normal heart sounds.  Pulmonary/Chest: bil rhonchi Neck = supple, no nuchal rigidity Abdominal: Soft. Bowel sounds are normal.  exhibits no distension. There is no tenderness.  Lymphadenopathy: no cervical adenopathy. No axillary adenopathy Neurological: alert  Skin: Skin is warm and dry. No rash noted. No erythema.  Psychiatric: a normal mood and affect.  behavior is normal.    LABS: Results for orders placed or performed during the hospital encounter of 12/21/14 (from the past 48 hour(s))  Glucose, capillary     Status: None   Collection  Time: 12/25/14  4:30 PM  Result Value Ref Range   Glucose-Capillary 78 65 - 99 mg/dL   Comment 1 Notify RN   Glucose, capillary     Status: None   Collection Time: 12/25/14  8:35 PM  Result Value Ref Range   Glucose-Capillary 88 65 - 99 mg/dL   Comment 1 Notify RN   Basic metabolic panel     Status: Abnormal   Collection Time: 12/26/14  4:42 AM  Result Value Ref Range   Sodium 142 135 - 145 mmol/L   Potassium 3.1 (L) 3.5 - 5.1 mmol/L   Chloride 106 101 - 111 mmol/L   CO2 25 22 - 32 mmol/L   Glucose, Bld 80 65 - 99 mg/dL   BUN 18 6 - 20 mg/dL   Creatinine, Ser 1.17 (H) 0.44 - 1.00 mg/dL   Calcium 7.9 (L) 8.9 - 10.3 mg/dL   GFR calc non Af Amer 44 (L) >60 mL/min   GFR calc Af Amer 51 (L) >60 mL/min    Comment: (NOTE) The eGFR has been calculated using the CKD EPI equation. This calculation has not been validated in all clinical situations. eGFR's persistently <60 mL/min signify possible Chronic Kidney Disease.    Anion gap 11 5 - 15  Glucose, capillary     Status: None   Collection Time: 12/26/14  7:42 AM  Result Value Ref Range   Glucose-Capillary 69 65 - 99 mg/dL  Glucose, capillary     Status: None   Collection Time: 12/26/14 11:24 AM  Result Value Ref Range   Glucose-Capillary 87 65 - 99 mg/dL  Culture, blood (routine x 2)     Status: None (Preliminary result)   Collection Time: 12/26/14  5:27 PM  Result Value Ref Range   Specimen Description BLOOD LEFT HAND    Special Requests      BOTTLES DRAWN AEROBIC AND ANAEROBIC  AER 4CC ANA 6CC   Culture NO GROWTH < 24 HOURS    Report Status PENDING   Vancomycin, random     Status: None   Collection Time: 12/26/14  5:27 PM  Result Value Ref Range   Vancomycin Rm 36 ug/mL    Comment:        Random Vancomycin therapeutic range is dependent on dosage and time of specimen collection. A peak range is 20.0-40.0 ug/mL A trough range is 5.0-15.0 ug/mL          Culture, blood (routine x 2)     Status: None (Preliminary result)    Collection Time: 12/26/14  5:33 PM  Result Value Ref Range   Specimen Description BLOOD RIGHT HAND    Special Requests BOTTLES DRAWN AEROBIC AND ANAEROBIC  [place]    Culture NO GROWTH < 24 HOURS    Report Status PENDING  Glucose, capillary     Status: Abnormal   Collection Time: 12/26/14  8:23 PM  Result Value Ref Range   Glucose-Capillary 100 (H) 65 - 99 mg/dL  Glucose, capillary     Status: None   Collection Time: 12/27/14  7:17 AM  Result Value Ref Range   Glucose-Capillary 76 65 - 99 mg/dL   Comment 1 Notify RN    No components found for: ESR, C REACTIVE PROTEIN MICRO: Recent Results (from the past 720 hour(s))  Culture, blood (routine x 2)     Status: None   Collection Time: 12/21/14 11:50 PM  Result Value Ref Range Status   Specimen Description BLOOD RIGHT ARM  Final   Special Requests   Final    BOTTLES DRAWN AEROBIC AND ANAEROBIC 10MLANAEROBIC, 5MLAEROBIC   Culture  Setup Time   Final    GRAM POSITIVE COCCI IN CLUSTERS ANAEROBIC BOTTLE ONLY CRITICAL VALUE NOTED.  VALUE IS CONSISTENT WITH PREVIOUSLY REPORTED AND CALLED VALUE. CONFIRMED BY [facility]    Culture   Final    COAGULASE NEGATIVE STAPHYLOCOCCUS ANAEROBIC BOTTLE ONLY Results consistent with contamination.    Report Status 12/27/2014 FINAL  Final  Culture, blood (routine x 2)     Status: None   Collection Time: 12/21/14 11:50 PM  Result Value Ref Range Status   Specimen Description BLOOD RIGHT ARM  Final   Special Requests   Final    BOTTLES DRAWN AEROBIC AND ANAEROBIC 5MLAEROBIC, 7MLANAEROBIC   Culture  Setup Time   Final    GRAM POSITIVE COCCI IN CLUSTERS IN BOTH AEROBIC AND ANAEROBIC BOTTLES CRITICAL RESULT CALLED TO, READ BACK BY AND VERIFIED WITH: IRIS GUIDRY @0203  12/23/14.Marland KitchenAJO CONFIRMED BY [facility]    Culture STAPHYLOCOCCUS EPIDERMIDIS  Final   Report Status 12/25/2014 FINAL  Final   Organism ID, Bacteria STAPHYLOCOCCUS EPIDERMIDIS  Final      Susceptibility   Staphylococcus epidermidis - MIC*     CIPROFLOXACIN >=8 RESISTANT Resistant     ERYTHROMYCIN >=8 RESISTANT Resistant     GENTAMICIN <=0.5 SENSITIVE Sensitive     OXACILLIN Value in next row Resistant      >=4 RESISTANTWARNING: For oxacillin-resistant S.aureus and coagulase-negative staphylococci (MRS), other beta-lactam agents, ie, penicillins, beta-lactam/beta-lactamase inhibitor combinations, cephems (with the exception of the cephalosporins with anti-MRSA activity), and carbapenems, may appear active in vitro, but are not effective clinically.  --CLSI, Vol.32 No.3, January 2012, pg 70.    TETRACYCLINE Value in next row Resistant      >=4 RESISTANTWARNING: For oxacillin-resistant S.aureus and coagulase-negative staphylococci (MRS), other beta-lactam agents, ie, penicillins, beta-lactam/beta-lactamase inhibitor combinations, cephems (with the exception of the cephalosporins with anti-MRSA activity), and carbapenems, may appear active in vitro, but are not effective clinically.  --CLSI, Vol.32 No.3, January 2012, pg 70.    VANCOMYCIN Value in next row Sensitive      >=4 RESISTANTWARNING: For oxacillin-resistant S.aureus and coagulase-negative staphylococci (MRS), other beta-lactam agents, ie, penicillins, beta-lactam/beta-lactamase inhibitor combinations, cephems (with the exception of the cephalosporins with anti-MRSA activity), and carbapenems, may appear active in vitro, but are not effective clinically.  --CLSI, Vol.32 No.3, January 2012, pg 70.    CLINDAMYCIN Value in next row Resistant      >=4 RESISTANTWARNING: For oxacillin-resistant S.aureus and coagulase-negative staphylococci (MRS), other beta-lactam agents, ie, penicillins, beta-lactam/beta-lactamase inhibitor combinations, cephems (with the exception of the cephalosporins with anti-MRSA activity), and carbapenems, may appear active in vitro, but are not effective clinically.  --CLSI, Vol.32 No.3, January 2012, pg 70.    *  STAPHYLOCOCCUS EPIDERMIDIS  Culture, blood (routine x 2)      Status: None (Preliminary result)   Collection Time: 12/26/14  5:27 PM  Result Value Ref Range Status   Specimen Description BLOOD LEFT HAND  Final   Special Requests   Final    BOTTLES DRAWN AEROBIC AND ANAEROBIC  AER 4CC ANA 6CC   Culture NO GROWTH < 24 HOURS  Final   Report Status PENDING  Incomplete  Culture, blood (routine x 2)     Status: None (Preliminary result)   Collection Time: 12/26/14  5:33 PM  Result Value Ref Range Status   Specimen Description BLOOD RIGHT HAND  Final   Special Requests BOTTLES DRAWN AEROBIC AND ANAEROBIC  [place]  Final   Culture NO GROWTH < 24 HOURS  Final   Report Status PENDING  Incomplete    IMAGING: Dg Chest 1 View  12/25/2014   CLINICAL DATA:  Pneumonia  EXAM: CHEST  1 VIEW  COMPARISON:  12/21/2014, 12/22/2014  FINDINGS: Cardiac shadow is stable. Persistent increased density is noted in the right lower lobe when compare with the prior exam. Associated small bilateral pleural effusions are noted. The left effusion is new from the prior exam. Persistent abrupt cut off of the right lower lobe bronchus is noted related to mucous plugging.  IMPRESSION: Persistent right lower lobe consolidation.  New left pleural effusion.   Electronically Signed   By: Inez Catalina M.D.   On: 12/25/2014 07:22   Ct Head Wo Contrast  12/22/2014   CLINICAL DATA:  Altered mental status beginning today.  EXAM: CT HEAD WITHOUT CONTRAST  TECHNIQUE: Contiguous axial images were obtained from the base of the skull through the vertex without intravenous contrast.  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 No fracture or destructive process.  Chronic opacification of a posterior right ethmoid air cell without expansion.  Orbits: No acute abnormality.  Brain: No evidence of acute infarction, hemorrhage, hydrocephalus, or mass lesion/mass effect.  Atrophy with ventriculomegaly which is stable from prior and only mildly progressed from  2014. Confluent low-density in the periventricular white matter bifrontally consistent with chronic small vessel disease. There have been remote perforator infarcts in the left corona radiata and right anterior internal capsule.  IMPRESSION: No acute finding or change from February 2016.   Electronically Signed   By: Monte Fantasia M.D.   On: 12/22/2014 01:33   Ct Angio Chest Pe W/cm &/or Wo Cm  12/22/2014   CLINICAL DATA:  Acute onset of altered mental status. Cough. Initial encounter.  EXAM: CT ANGIOGRAPHY CHEST WITH CONTRAST  TECHNIQUE: Multidetector CT imaging of the chest was performed using the standard protocol during bolus administration of intravenous contrast. Multiplanar CT image reconstructions and MIPs were obtained to evaluate the vascular anatomy.  CONTRAST:  50m OMNIPAQUE IOHEXOL 350 MG/ML SOLN  COMPARISON:  Chest radiograph performed 10/21/2014, and CT of the chest performed 06/17/2014  FINDINGS: There is no evidence of pulmonary embolus.  There is dense consolidation of the right lower lobe, with fluid and debris filling the right mainstem bronchus and bronchioles to the right lower lobe. Additional fluid and debris are seen extending into the right upper lobe, with underlying patchy airspace opacities, compatible with significant aspiration pneumonia. Trace underlying right-sided pleural fluid is noted. Minimal left-sided atelectasis and scarring are seen. There is no evidence of pneumothorax. No masses are identified; no abnormal focal contrast enhancement is seen.  There is rightward mediastinal  shift, as previously noted, reflecting right-sided volume loss. A 1.3 cm right paratracheal node likely reflects the underlying acute infectious process. A likely enlarged 1.3 cm subcarinal node is seen, with adjacent edema. No pericardial effusion is identified. Scattered calcification is noted along the aortic arch and proximal great vessels. No axillary lymphadenopathy is seen. The visualized  portions of the thyroid gland are unremarkable in appearance.  The visualized portions of the liver and spleen are unremarkable. The visualized portions of the stomach, adrenal glands and kidneys are within normal limits.  No acute osseous abnormalities are seen.  Review of the MIP images confirms the above findings.  IMPRESSION: 1. No evidence of pulmonary embolus. 2. Dense right lower lobe consolidation, with fluid and debris filling the right mainstem bronchus and bronchioles to the right lower lobe. Additional fluid and debris extending into the right upper lobe, with underlying patchy airspace opacities, compatible with significant right-sided aspiration pneumonia. Trace underlying right-sided pleural fluid noted. 3. Minimal left-sided atelectasis and scarring seen. 4. Rightward mediastinal shift noted, reflecting right-sided volume loss. 5. Enlarged mediastinal nodes likely reflect the underlying acute infectious process. These results were called by telephone at the time of interpretation on 12/22/2014 at 1:44 am to Dr. Lurline Hare, who verbally acknowledged these results.   Electronically Signed   By: Garald Balding M.D.   On: 12/22/2014 01:45   Dg Chest Port 1 View  12/22/2014   CLINICAL DATA:  Repeat chest radiograph requested. Subsequent encounter.  EXAM: PORTABLE CHEST - 1 VIEW  COMPARISON:  Chest radiograph performed earlier today at 11:20 p.m.  FINDINGS: There is slightly improved rightward mediastinal shift. Right basilar airspace opacity is better characterized, and raises concern for pneumonia. A small right pleural effusion is suspected. No pneumothorax is seen. The left lung appears clear.  The cardiomediastinal silhouette is borderline normal in size. No acute osseous abnormalities are identified.  IMPRESSION: Slightly improved rightward mediastinal shift. Right basilar airspace opacity is better characterized, and raises concern for pneumonia. Suspect small right pleural effusion. Followup PA  and lateral chest X-ray is recommended in 3-4 weeks following trial of antibiotic therapy to ensure resolution and exclude underlying malignancy.   Electronically Signed   By: Garald Balding M.D.   On: 12/22/2014 00:35   Dg Chest Port 1 View  12/22/2014   CLINICAL DATA:  Acute onset of confusion.  Initial encounter.  EXAM: PORTABLE CHEST - 1 VIEW  COMPARISON:  Chest radiograph performed 11/01/2014  FINDINGS: There is rightward displacement of the mediastinum, which appears to reflect right-sided volume loss. Underlying right basilar pneumonia cannot be excluded. The left lung appears clear. No definite pleural effusion or pneumothorax is seen.  The cardiomediastinal silhouette is borderline normal in size. Prominence of the right paratracheal soft tissues is relatively stable, and may reflect normal vasculature. No acute osseous abnormalities are identified.  IMPRESSION: Rightward displacement of the mediastinum, which appears to reflect right-sided volume loss. Underlying right basilar pneumonia cannot be excluded. Would correlate for associated symptoms.  A central obstructing mass cannot be entirely excluded. Followup PA and lateral chest X-ray is recommended in 3-4 weeks following treatment, to ensure resolution and exclude underlying malignancy.   Electronically Signed   By: Garald Balding M.D.   On: 12/22/2014 00:19    Assessment:   Prestina Raigoza is a 80 y.o. female with aspiration PNA and coag neg staph bacteremia (likely contaminant) Clinically responding with decrease wbc 35-15.  Afebrile. Weaning off O2.  Recommendations Can change to  augmentin and finish a total 10 day course of abx Will need aspiration precautions  Thank you very much for allowing me to participate in the care of this patient. Please call with questions.   Cheral Marker. Ola Spurr, MD

## 2014-12-27 NOTE — Care Management (Signed)
Patient has positive blood culture.  She has just this day been weaned off HFNC to  3.5 liters of 02 per nasal cannula.  Discussed palliative care consult benefit during progression

## 2014-12-28 LAB — VANCOMYCIN, RANDOM: Vancomycin Rm: 20 ug/mL

## 2014-12-28 LAB — BASIC METABOLIC PANEL
Anion gap: 10 (ref 5–15)
BUN: 11 mg/dL (ref 6–20)
CALCIUM: 7.8 mg/dL — AB (ref 8.9–10.3)
CHLORIDE: 103 mmol/L (ref 101–111)
CO2: 27 mmol/L (ref 22–32)
CREATININE: 1.06 mg/dL — AB (ref 0.44–1.00)
GFR calc Af Amer: 57 mL/min — ABNORMAL LOW (ref >60)
GFR calc non Af Amer: 49 mL/min — ABNORMAL LOW (ref >60)
GLUCOSE: 90 mg/dL (ref 65–99)
Potassium: 2.7 mmol/L — CL (ref 3.5–5.1)
Sodium: 140 mmol/L (ref 135–145)

## 2014-12-28 LAB — CBC
HEMATOCRIT: 30.7 % — AB (ref 35.0–47.0)
Hemoglobin: 9.6 g/dL — ABNORMAL LOW (ref 12.0–16.0)
MCH: 25.9 pg — AB (ref 26.0–34.0)
MCHC: 31.3 g/dL — AB (ref 32.0–36.0)
MCV: 82.7 fL (ref 80.0–100.0)
Platelets: 455 10*3/uL — ABNORMAL HIGH (ref 150–440)
RBC: 3.72 MIL/uL — ABNORMAL LOW (ref 3.80–5.20)
RDW: 16.6 % — AB (ref 11.5–14.5)
WBC: 11.3 10*3/uL — ABNORMAL HIGH (ref 3.6–11.0)

## 2014-12-28 LAB — GLUCOSE, CAPILLARY
GLUCOSE-CAPILLARY: 113 mg/dL — AB (ref 65–99)
Glucose-Capillary: 91 mg/dL (ref 65–99)
Glucose-Capillary: 123 mg/dL — ABNORMAL HIGH (ref 65–99)

## 2014-12-28 MED ORDER — POTASSIUM CHLORIDE CRYS ER 20 MEQ PO TBCR: 20.0000 meq | EXTENDED_RELEASE_TABLET | Freq: Two times a day (BID) | ORAL | Status: DC

## 2014-12-28 MED ORDER — INSULIN DETEMIR 100 UNIT/ML ~~LOC~~ SOLN: 5.0000 [IU] | Freq: Every day | SUBCUTANEOUS | Status: DC

## 2014-12-28 MED ORDER — AMOXICILLIN-POT CLAVULANATE 875-125 MG PO TABS: 1.0000 | ORAL_TABLET | Freq: Two times a day (BID) | ORAL | Status: DC

## 2014-12-28 MED ORDER — FUROSEMIDE 10 MG/ML IJ SOLN
40.0000 mg | INTRAMUSCULAR | Status: AC
  Administered 2014-12-28: 40 mg via INTRAVENOUS
  Filled 2014-12-28: qty 4

## 2014-12-28 MED ORDER — POTASSIUM CHLORIDE CRYS ER 20 MEQ PO TBCR
60.0000 meq | EXTENDED_RELEASE_TABLET | ORAL | Status: AC
  Administered 2014-12-28: 60 meq via ORAL
  Filled 2014-12-28: qty 3

## 2014-12-28 NOTE — Discharge Instructions (Signed)
Aspiration Pneumonia  Aspiration pneumonia is an infection in your lungs. It occurs when food, liquid, or stomach contents (vomit) are inhaled (aspirated) into your lungs. When these things get into your lungs, swelling (inflammation) and infection can occur. This can make it difficult for you to breathe. Aspiration pneumonia is a serious condition and can be life threatening. RISK FACTORS Aspiration pneumonia is more likely to occur when a person's cough (gag) reflex or ability to swallow has been decreased. Some things that can do this include:   Having a brain injury or disease, such as stroke, seizures, Parkinson's disease, dementia, or amyotrophic lateral sclerosis (ALS).   Being given general anesthetic for procedures.   Being in a coma (unconscious).   Having a narrowing of the tube that carries food to the stomach (esophagus).   Drinking too much alcohol. If a person passes out and vomits, vomit can be swallowed into the lungs.   Taking certain medicines, such as tranquilizers or sedatives.  SIGNS AND SYMPTOMS   Coughing after swallowing food or liquids.   Breathing problems, such as wheezing or shortness of breath.   Bluish skin. This can be caused by lack of oxygen.   Coughing up food or mucus. The mucus might contain blood, greenish material, or yellowish-white fluid (pus).   Fever.   Chest pain.   Being more tired than usual (fatigue).   Sweating more than usual.   Bad breath.  DIAGNOSIS  A physical exam will be done. During the exam, the health care provider will listen to your lungs with a stethoscope to check for:   Crackling sounds in the lungs.  Decreased breath sounds.  A rapid heartbeat. Various tests may be ordered. These may include:   Chest X-ray.   CT scan.   Swallowing study. This test looks at how food is swallowed and whether it goes into your breathing tube (trachea) or food pipe (esophagus).   Sputum culture. Saliva and  mucus (sputum) are collected from the lungs or the tubes that carry air to the lungs (bronchi). The sputum is then tested for bacteria.   Bronchoscopy. This test uses a flexible tube (bronchoscope) to see inside the lungs. TREATMENT  Treatment will usually include antibiotic medicines. Other medicines may also be used to reduce fever or pain. You may need to be treated in the hospital. In the hospital, your breathing will be carefully monitored. Depending on how well you are breathing, you may need to be given oxygen, or you may need breathing support from a breathing machine (ventilator). For people who fail a swallowing study, a feeding tube might be placed in the stomach, or they may be asked to avoid certain food textures or liquids when they eat. HOME CARE INSTRUCTIONS   Carefully follow any special eating instructions you were given, such as avoiding certain food textures or thickening liquids. This reduces the risk of developing aspiration pneumonia again.  Only take over-the-counter or prescription medicines as directed by your health care provider. Follow the directions carefully.   If you were prescribed antibiotics, take them as directed. Finish them even if you start to feel better.   Rest as instructed by your health care provider.   Keep all follow-up appointments with your health care provider.  SEEK MEDICAL CARE IF:   You develop worsening shortness of breath, wheezing, or difficulty breathing.   You develop a fever.   You have chest pain.  MAKE SURE YOU:   Understand these instructions.  Will watch   condition.  Will get help right away if you are not doing well or get worse. Document Released: 02/18/2009 Document Revised: 04/28/2013 Document Reviewed: 10/09/2012 [facility] Patient Information 2015 [place], [place]. This information is not intended to replace advice given to you by your health care provider. Make sure you discuss any questions you have with  your health care provider.     [place] AND SEE 12/29/2014

## 2014-12-28 NOTE — Progress Notes (Signed)
ANTIBIOTIC CONSULT NOTE - FOLLOW UP   Pharmacy Consult for Zosyn Indication: Aspiration PNA  No Known Allergies  Patient Measurements: Height: 5\' 6"  (167.6 cm) Weight: 145 lb 4.5 oz (65.9 kg) IBW/kg (Calculated) : 59.3 Adjusted Body Weight: 73.7 kg  Vital Signs: Temp: 97.5 F (36.4 C) (08/23 1145) Temp Source: Oral (08/23 1145) BP: 157/78 mmHg (08/23 1145) Pulse Rate: 63 (08/23 1145) Intake/Output from previous day: 08/22 0701 - 08/23 0700 In: 240 [P.O.:240] Out: 0  Intake/Output from this shift:    Labs:  Recent Labs  12/26/14 0442 12/28/14 0716  WBC  --  11.3*  HGB  --  9.6*  PLT  --  455*  CREATININE 1.17* 1.06*   Estimated Creatinine Clearance: 41.6 mL/min (by C-G formula based on Cr of 1.06).  Recent Labs  12/27/14 1913 12/28/14 0716  VANCORANDOM 23 20     Microbiology: Recent Results (from the past 720 hour(s))  Culture, blood (routine x 2)     Status: None   Collection Time: 12/21/14 11:50 PM  Result Value Ref Range Status   Specimen Description BLOOD RIGHT ARM  Final   Special Requests   Final    BOTTLES DRAWN AEROBIC AND ANAEROBIC 10MLANAEROBIC, 5MLAEROBIC   Culture  Setup Time   Final    GRAM POSITIVE COCCI IN CLUSTERS ANAEROBIC BOTTLE ONLY CRITICAL VALUE NOTED.  VALUE IS CONSISTENT WITH PREVIOUSLY REPORTED AND CALLED VALUE. CONFIRMED BY [facility]    Culture   Final    COAGULASE NEGATIVE STAPHYLOCOCCUS ANAEROBIC BOTTLE ONLY Results consistent with contamination.    Report Status 12/27/2014 FINAL  Final  Culture, blood (routine x 2)     Status: None   Collection Time: 12/21/14 11:50 PM  Result Value Ref Range Status   Specimen Description BLOOD RIGHT ARM  Final   Special Requests   Final    BOTTLES DRAWN AEROBIC AND ANAEROBIC 5MLAEROBIC, 7MLANAEROBIC   Culture  Setup Time   Final    GRAM POSITIVE COCCI IN CLUSTERS IN BOTH AEROBIC AND ANAEROBIC BOTTLES CRITICAL RESULT CALLED TO, READ BACK BY AND VERIFIED WITH: IRIS GUIDRY @0203   12/23/14.Marland KitchenAJO CONFIRMED BY [facility]    Culture STAPHYLOCOCCUS EPIDERMIDIS  Final   Report Status 12/25/2014 FINAL  Final   Organism ID, Bacteria STAPHYLOCOCCUS EPIDERMIDIS  Final      Susceptibility   Staphylococcus epidermidis - MIC*    CIPROFLOXACIN >=8 RESISTANT Resistant     ERYTHROMYCIN >=8 RESISTANT Resistant     GENTAMICIN <=0.5 SENSITIVE Sensitive     OXACILLIN Value in next row Resistant      >=4 RESISTANTWARNING: For oxacillin-resistant S.aureus and coagulase-negative staphylococci (MRS), other beta-lactam agents, ie, penicillins, beta-lactam/beta-lactamase inhibitor combinations, cephems (with the exception of the cephalosporins with anti-MRSA activity), and carbapenems, may appear active in vitro, but are not effective clinically.  --CLSI, Vol.32 No.3, January 2012, pg 70.    TETRACYCLINE Value in next row Resistant      >=4 RESISTANTWARNING: For oxacillin-resistant S.aureus and coagulase-negative staphylococci (MRS), other beta-lactam agents, ie, penicillins, beta-lactam/beta-lactamase inhibitor combinations, cephems (with the exception of the cephalosporins with anti-MRSA activity), and carbapenems, may appear active in vitro, but are not effective clinically.  --CLSI, Vol.32 No.3, January 2012, pg 70.    VANCOMYCIN Value in next row Sensitive      >=4 RESISTANTWARNING: For oxacillin-resistant S.aureus and coagulase-negative staphylococci (MRS), other beta-lactam agents, ie, penicillins, beta-lactam/beta-lactamase inhibitor combinations, cephems (with the exception of the cephalosporins with anti-MRSA activity), and carbapenems, may appear active in vitro, but  are not effective clinically.  --CLSI, Vol.32 No.3, January 2012, pg 70.    CLINDAMYCIN Value in next row Resistant      >=4 RESISTANTWARNING: For oxacillin-resistant S.aureus and coagulase-negative staphylococci (MRS), other beta-lactam agents, ie, penicillins, beta-lactam/beta-lactamase inhibitor combinations, cephems (with the  exception of the cephalosporins with anti-MRSA activity), and carbapenems, may appear active in vitro, but are not effective clinically.  --CLSI, Vol.32 No.3, January 2012, pg 70.    * STAPHYLOCOCCUS EPIDERMIDIS  Culture, blood (routine x 2)     Status: None (Preliminary result)   Collection Time: 12/26/14  5:27 PM  Result Value Ref Range Status   Specimen Description BLOOD LEFT HAND  Final   Special Requests   Final    BOTTLES DRAWN AEROBIC AND ANAEROBIC  AER 4CC ANA 6CC   Culture NO GROWTH 2 DAYS  Final   Report Status PENDING  Incomplete  Culture, blood (routine x 2)     Status: None (Preliminary result)   Collection Time: 12/26/14  5:33 PM  Result Value Ref Range Status   Specimen Description BLOOD RIGHT HAND  Final   Special Requests BOTTLES DRAWN AEROBIC AND ANAEROBIC  [place]  Final   Culture NO GROWTH 2 DAYS  Final   Report Status PENDING  Incomplete    Medical History: Past Medical History  Diagnosis Date  . Stroke   . COPD (chronic obstructive pulmonary disease)   . Coronary artery disease   . Hypertension   . Diabetes mellitus type 1     Medications:  Infusions:    Assessment: 55 yof with AMS and cough originally ordered CTX and azithro in ED. Patient's vancomycin discontinued on 8/19. Patient currently on Zosyn EI 3.375g IV Q8hr.  Per ID consult: Patient with Aspiration PNA, MRSE in blood cx- contaminant. REpeat Bcx so far NGTD.  Plan:  Will continue patient on Zosyn EI 3.375g IV Q8hr. (per ID transition to Augmentin when ready)    Pharmacy will continue to monitor and adjust per consult.     Priyal Musquiz A, Pharm.D. Clinical Pharmacist 12/28/2014,12:35 PM

## 2014-12-28 NOTE — Care Management (Signed)
Informed attending that palliative care service is not available today. This should not prevent discharge.  Spoke with [facility] and informed that it has been discussed to transition patient to hospice and Dr Aris Lot was going to discuss this with patient and family at next visit.  Patient requests transport by ems to home which is medically necessary.  Discussed that there could be a possibility that insurance would not cover and  patient verbalizes understanding.  Spoke with patient's grandson and verbalized understanding of need to be at the residence to receive patient.  Called and faxed the discharge information to [facility].  Patient now has DNR and asked attending to sign.

## 2014-12-28 NOTE — Progress Notes (Signed)
DR Manuella Ghazi was informed of pt's k-level of 2.7 , stated will put in some order , will continue to monitor

## 2014-12-29 LAB — GLUCOSE, CAPILLARY
GLUCOSE-CAPILLARY: 126 mg/dL — AB (ref 65–99)
GLUCOSE-CAPILLARY: 80 mg/dL (ref 65–99)
Glucose-Capillary: 110 mg/dL — ABNORMAL HIGH (ref 65–99)
Glucose-Capillary: 104 mg/dL — ABNORMAL HIGH (ref 65–99)

## 2014-12-29 LAB — COMP PANEL: LEUKEMIA/LYMPHOMA

## 2014-12-30 NOTE — Discharge Summary (Signed)
[facility] [place] NAME: Melisha Eggleton    MR#:  [id number]  DATE OF BIRTH:  [birth year]  DATE OF ADMISSION:  12/21/2014 ADMITTING PHYSICIAN: Lytle Butte, MD  DATE OF DISCHARGE: 12/28/2014  6:55 PM  PRIMARY CARE PHYSICIAN: Alvester Chou, NP    ADMISSION DIAGNOSIS:  Community acquired pneumonia [J18.9] Hypoxia [R09.02] Aspiration pneumonia, unspecified aspiration pneumonia type [J69.0] Sepsis, due to unspecified organism [A41.9]  DISCHARGE DIAGNOSIS:  Active Problems:   Sepsis   Acute respiratory insufficiency   HCAP (healthcare-associated pneumonia)  hypokalemia Acute metabolic encephalopathy. Acute hypoxic respiratory failure SECONDARY DIAGNOSIS:   Past Medical History  Diagnosis Date  . Stroke   . COPD (chronic obstructive pulmonary disease)   . Coronary artery disease   . Hypertension   . Diabetes mellitus type 1    HOSPITAL COURSE:  80 y.o. female with a known history of COPD non-oxygen dependent, type 2 diabetes uncomplicated, hypertension, CVA presenting with cough and altered mental status.  Please see Dr. Samantha Crimes dated history and physical for further details.  She was found to have sepsis which was present on admission secondary to aspiration pneumonia.  Her mental status changes were thought to be secondary to sepsis causing metabolic encephalopathy.  I had a long discussion with the patient's POA about her risk for recurrent aspiration and likely pneumonia.  She was evaluated by infectious disease.  Dr. Ola Spurr who recommended course of oral antibiotics for total 10 days.  Patient was doing much better.  August 23 and was close to her baseline and was discharged home in stable condition.  Patient and family were in agreement to consider hospice if she gets sick again.  They already have home health services were set up who can provide her switch over to hospice as needed. DISCHARGE CONDITIONS:  Stable CONSULTS  OBTAINED:  Treatment Team:  Lytle Butte, MD Adrian Prows, MD DRUG ALLERGIES:  No Known Allergies DISCHARGE MEDICATIONS:   Discharge Medication List as of 12/28/2014  4:15 PM    START taking these medications   Details  amoxicillin-clavulanate (AUGMENTIN) 875-125 MG per tablet Take 1 tablet by mouth 2 (two) times daily., Starting 12/28/2014, Until Discontinued, Normal      CONTINUE these medications which have CHANGED   Details  insulin detemir (LEVEMIR) 100 UNIT/ML injection Inject 0.05 mLs (5 Units total) into the skin at bedtime., Starting 12/28/2014, Until Discontinued, Normal    potassium chloride SA (K-DUR,KLOR-CON) 20 MEQ tablet Take 1 tablet (20 mEq total) by mouth 2 (two) times daily., Starting 12/28/2014, Until Discontinued, Print      CONTINUE these medications which have NOT CHANGED   Details  albuterol (PROVENTIL HFA;VENTOLIN HFA) 108 (90 BASE) MCG/ACT inhaler Inhale 2 puffs into the lungs every 6 (six) hours as needed for wheezing or shortness of breath. 2 puffs 3 times daily x 3 days then use as needed., Starting 05/12/2013, Until Discontinued, Print    clopidogrel (PLAVIX) 75 MG tablet Take 75 mg by mouth daily., Starting 10/15/2014, Until Discontinued, Historical Med    feeding supplement, GLUCERNA SHAKE, (GLUCERNA SHAKE) LIQD Take 237 mLs by mouth 2 (two) times daily between meals., Starting 05/12/2013, Until Discontinued, No Print    !! furosemide (LASIX) 40 MG tablet Take 40 mg by mouth every other day., Until Discontinued, Historical Med    ipratropium-albuterol (DUONEB) 0.5-2.5 (3) MG/3ML SOLN Take 3 mLs by nebulization 2 (two) times daily., Starting 05/12/2013, Until Discontinued, Print  lisinopril (PRINIVIL,ZESTRIL) 2.5 MG tablet Take 1 tablet (2.5 mg total) by mouth daily., Starting 05/12/2013, Until Discontinued, Print    metoprolol (LOPRESSOR) 50 MG tablet Take 1 tablet (50 mg total) by mouth 2 (two) times daily., Starting 05/12/2013, Until Discontinued, Print     mirtazapine (REMERON) 15 MG tablet Take 15 mg by mouth daily., Until Discontinued, Historical Med    rosuvastatin (CRESTOR) 10 MG tablet Take 10 mg by mouth daily., Until Discontinued, Historical Med    traMADol (ULTRAM) 50 MG tablet Take 50 mg by mouth every 6 (six) hours as needed for moderate pain., Until Discontinued, Historical Med    ALPRAZolam (XANAX) 0.25 MG tablet Take 1 tablet (0.25 mg total) by mouth 2 (two) times daily as needed for anxiety., Starting 05/12/2013, Until Discontinued, Print    aspirin 81 MG chewable tablet Chew 1 tablet (81 mg total) by mouth daily., Starting 05/12/2013, Until Discontinued, No Print    atorvastatin (LIPITOR) 40 MG tablet Take 1 tablet (40 mg total) by mouth daily at 6 PM., Starting 05/12/2013, Until Discontinued, Print    !! furosemide (LASIX) 40 MG tablet Take 1 tablet (40 mg total) by mouth daily., Starting 05/12/2013, Until Discontinued, Print    isosorbide-hydrALAZINE (BIDIL) 20-37.5 MG per tablet Take 1 tablet by mouth 3 (three) times daily., Starting 05/12/2013, Until Discontinued, Print     !! - Potential duplicate medications found. Please discuss with provider.    STOP taking these medications     azithromycin (ZITHROMAX) 250 MG tablet      cefUROXime (CEFTIN) 500 MG tablet        DISCHARGE INSTRUCTIONS:   DIET:  Cardiac diet  DISCHARGE CONDITION:  Good  ACTIVITY:  Activity as tolerated  OXYGEN:  Home Oxygen: No.   Oxygen Delivery: room air  DISCHARGE LOCATION:  home   If you experience worsening of your admission symptoms, develop shortness of breath, life threatening emergency, suicidal or homicidal thoughts you must seek medical attention immediately by calling 911 or calling your MD immediately  if symptoms less severe.  You Must read complete instructions/literature along with all the possible adverse reactions/side effects for all the Medicines you take and that have been prescribed to you. Take any new Medicines  after you have completely understood and accpet all the possible adverse reactions/side effects.   Please note  You were cared for by a hospitalist during your hospital stay. If you have any questions about your discharge medications or the care you received while you were in the hospital after you are discharged, you can call the unit and asked to speak with the hospitalist on call if the hospitalist that took care of you is not available. Once you are discharged, your primary care physician will handle any further medical issues. Please note that NO REFILLS for any discharge medications will be authorized once you are discharged, as it is imperative that you return to your primary care physician (or establish a relationship with a primary care physician if you do not have one) for your aftercare needs so that they can reassess your need for medications and monitor your lab values.    On the day of Discharge: VITAL SIGNS:  Blood pressure 157/78, pulse 63, temperature 97.5 F (36.4 C), temperature source Oral, resp. rate 16, height 5\' 6"  (1.676 m), weight 65.9 kg (145 lb 4.5 oz), SpO2 92 %. PHYSICAL EXAMINATION:  GENERAL:  80 y.o.-year-old patient lying in the bed with no acute distress.  EYES: Pupils equal,  round, reactive to light and accommodation. No scleral icterus. Extraocular muscles intact.  HEENT: Head atraumatic, normocephalic. Oropharynx and nasopharynx clear.  NECK:  Supple, no jugular venous distention. No thyroid enlargement, no tenderness.  LUNGS: Normal breath sounds bilaterally, no wheezing, rales,rhonchi or crepitation. No use of accessory muscles of respiration.  CARDIOVASCULAR: S1, S2 normal. No murmurs, rubs, or gallops.  ABDOMEN: Soft, non-tender, non-distended. Bowel sounds present. No organomegaly or mass.  EXTREMITIES: No pedal edema, cyanosis, or clubbing.  NEUROLOGIC: Cranial nerves II through XII are intact. Muscle strength 5/5 in all extremities. Sensation intact.  Gait not checked.  PSYCHIATRIC: The patient is alert and oriented x 3.  SKIN: No obvious rash, lesion, or ulcer.  DATA REVIEW:   CBC  Recent Labs Lab 12/28/14 0716  WBC 11.3*  HGB 9.6*  HCT 30.7*  PLT 455*    Chemistries   Recent Labs Lab 12/28/14 0716  NA 140  K 2.7*  CL 103  CO2 27  GLUCOSE 90  BUN 11  CREATININE 1.06*  CALCIUM 7.8*    Microbiology Results  Results for orders placed or performed during the hospital encounter of 12/21/14  Culture, blood (routine x 2)     Status: None   Collection Time: 12/21/14 11:50 PM  Result Value Ref Range Status   Specimen Description BLOOD RIGHT ARM  Final   Special Requests   Final    BOTTLES DRAWN AEROBIC AND ANAEROBIC 10MLANAEROBIC, 5MLAEROBIC   Culture  Setup Time   Final    GRAM POSITIVE COCCI IN CLUSTERS ANAEROBIC BOTTLE ONLY CRITICAL VALUE NOTED.  VALUE IS CONSISTENT WITH PREVIOUSLY REPORTED AND CALLED VALUE. CONFIRMED BY [facility]    Culture   Final    COAGULASE NEGATIVE STAPHYLOCOCCUS ANAEROBIC BOTTLE ONLY Results consistent with contamination.    Report Status 12/27/2014 FINAL  Final  Culture, blood (routine x 2)     Status: None   Collection Time: 12/21/14 11:50 PM  Result Value Ref Range Status   Specimen Description BLOOD RIGHT ARM  Final   Special Requests   Final    BOTTLES DRAWN AEROBIC AND ANAEROBIC 5MLAEROBIC, 7MLANAEROBIC   Culture  Setup Time   Final    GRAM POSITIVE COCCI IN CLUSTERS IN BOTH AEROBIC AND ANAEROBIC BOTTLES CRITICAL RESULT CALLED TO, READ BACK BY AND VERIFIED WITH: IRIS GUIDRY @0203  12/23/14.Marland KitchenAJO CONFIRMED BY [facility]    Culture STAPHYLOCOCCUS EPIDERMIDIS  Final   Report Status 12/25/2014 FINAL  Final   Organism ID, Bacteria STAPHYLOCOCCUS EPIDERMIDIS  Final      Susceptibility   Staphylococcus epidermidis - MIC*    CIPROFLOXACIN >=8 RESISTANT Resistant     ERYTHROMYCIN >=8 RESISTANT Resistant     GENTAMICIN <=0.5 SENSITIVE Sensitive     OXACILLIN Value in next row Resistant       >=4 RESISTANTWARNING: For oxacillin-resistant S.aureus and coagulase-negative staphylococci (MRS), other beta-lactam agents, ie, penicillins, beta-lactam/beta-lactamase inhibitor combinations, cephems (with the exception of the cephalosporins with anti-MRSA activity), and carbapenems, may appear active in vitro, but are not effective clinically.  --CLSI, Vol.32 No.3, January 2012, pg 70.    TETRACYCLINE Value in next row Resistant      >=4 RESISTANTWARNING: For oxacillin-resistant S.aureus and coagulase-negative staphylococci (MRS), other beta-lactam agents, ie, penicillins, beta-lactam/beta-lactamase inhibitor combinations, cephems (with the exception of the cephalosporins with anti-MRSA activity), and carbapenems, may appear active in vitro, but are not effective clinically.  --CLSI, Vol.32 No.3, January 2012, pg 70.    VANCOMYCIN Value in next row Sensitive      >=  4 RESISTANTWARNING: For oxacillin-resistant S.aureus and coagulase-negative staphylococci (MRS), other beta-lactam agents, ie, penicillins, beta-lactam/beta-lactamase inhibitor combinations, cephems (with the exception of the cephalosporins with anti-MRSA activity), and carbapenems, may appear active in vitro, but are not effective clinically.  --CLSI, Vol.32 No.3, January 2012, pg 70.    CLINDAMYCIN Value in next row Resistant      >=4 RESISTANTWARNING: For oxacillin-resistant S.aureus and coagulase-negative staphylococci (MRS), other beta-lactam agents, ie, penicillins, beta-lactam/beta-lactamase inhibitor combinations, cephems (with the exception of the cephalosporins with anti-MRSA activity), and carbapenems, may appear active in vitro, but are not effective clinically.  --CLSI, Vol.32 No.3, January 2012, pg 70.    * STAPHYLOCOCCUS EPIDERMIDIS  Culture, blood (routine x 2)     Status: None (Preliminary result)   Collection Time: 12/26/14  5:27 PM  Result Value Ref Range Status   Specimen Description BLOOD LEFT HAND  Final   Special  Requests   Final    BOTTLES DRAWN AEROBIC AND ANAEROBIC  AER 4CC ANA 6CC   Culture NO GROWTH 4 DAYS  Final   Report Status PENDING  Incomplete  Culture, blood (routine x 2)     Status: None (Preliminary result)   Collection Time: 12/26/14  5:33 PM  Result Value Ref Range Status   Specimen Description BLOOD RIGHT HAND  Final   Special Requests BOTTLES DRAWN AEROBIC AND ANAEROBIC  [place]  Final   Culture NO GROWTH 4 DAYS  Final   Report Status PENDING  Incomplete   Management plans discussed with the patient, family and they are in agreement.  CODE STATUS: DO NOT RESUSCITATE  TOTAL TIME TAKING CARE OF THIS PATIENT: 55 minutes.    [facility], Kasey Hansell M.D on 12/30/2014 at 3:11 PM  Between 7am to 6pm - Pager - [phone]  After 6pm go to [email or web address] - password [email or web address] [facility]  Office  [phone]  CC: Primary care physician; Alvester Chou, NP Adrian Prows, MD

## 2014-12-31 LAB — CULTURE, BLOOD (ROUTINE X 2)
Culture: NO GROWTH
Culture: NO GROWTH

## 2015-02-03 ENCOUNTER — Inpatient Hospital Stay (HOSPITAL_COMMUNITY)
Admission: EM | Admit: 2015-02-03 | Discharge: 2015-02-09 | DRG: 871 | Disposition: A | Discharge status: Hospice | Payer: Medicare Other | Attending: Internal Medicine | Admitting: Internal Medicine

## 2015-02-03 ENCOUNTER — Emergency Department (HOSPITAL_COMMUNITY): Payer: Medicare Other

## 2015-02-03 ENCOUNTER — Encounter (HOSPITAL_COMMUNITY): Payer: Self-pay

## 2015-02-03 DIAGNOSIS — Y95 Nosocomial condition: Secondary | ICD-10-CM | POA: Diagnosis present

## 2015-02-03 DIAGNOSIS — Z79899 Other long term (current) drug therapy: Secondary | ICD-10-CM

## 2015-02-03 DIAGNOSIS — E860 Dehydration: Secondary | ICD-10-CM | POA: Diagnosis present

## 2015-02-03 DIAGNOSIS — R627 Adult failure to thrive: Secondary | ICD-10-CM | POA: Diagnosis present

## 2015-02-03 DIAGNOSIS — Z7902 Long term (current) use of antithrombotics/antiplatelets: Secondary | ICD-10-CM

## 2015-02-03 DIAGNOSIS — A419 Sepsis, unspecified organism: Secondary | ICD-10-CM | POA: Diagnosis present

## 2015-02-03 DIAGNOSIS — J9621 Acute and chronic respiratory failure with hypoxia: Secondary | ICD-10-CM

## 2015-02-03 DIAGNOSIS — R1310 Dysphagia, unspecified: Secondary | ICD-10-CM | POA: Diagnosis present

## 2015-02-03 DIAGNOSIS — R0602 Shortness of breath: Secondary | ICD-10-CM | POA: Diagnosis present

## 2015-02-03 DIAGNOSIS — R6521 Severe sepsis with septic shock: Secondary | ICD-10-CM

## 2015-02-03 DIAGNOSIS — D631 Anemia in chronic kidney disease: Secondary | ICD-10-CM | POA: Diagnosis present

## 2015-02-03 DIAGNOSIS — J189 Pneumonia, unspecified organism: Secondary | ICD-10-CM | POA: Diagnosis not present

## 2015-02-03 DIAGNOSIS — L89154 Pressure ulcer of sacral region, stage 4: Secondary | ICD-10-CM | POA: Diagnosis present

## 2015-02-03 DIAGNOSIS — Z7401 Bed confinement status: Secondary | ICD-10-CM | POA: Diagnosis not present

## 2015-02-03 DIAGNOSIS — L8990 Pressure ulcer of unspecified site, unspecified stage: Secondary | ICD-10-CM | POA: Diagnosis not present

## 2015-02-03 DIAGNOSIS — I130 Hypertensive heart and chronic kidney disease with heart failure and stage 1 through stage 4 chronic kidney disease, or unspecified chronic kidney disease: Secondary | ICD-10-CM | POA: Diagnosis present

## 2015-02-03 DIAGNOSIS — B965 Pseudomonas (aeruginosa) (mallei) (pseudomallei) as the cause of diseases classified elsewhere: Secondary | ICD-10-CM | POA: Diagnosis present

## 2015-02-03 DIAGNOSIS — E876 Hypokalemia: Secondary | ICD-10-CM | POA: Diagnosis not present

## 2015-02-03 DIAGNOSIS — J441 Chronic obstructive pulmonary disease with (acute) exacerbation: Secondary | ICD-10-CM | POA: Diagnosis present

## 2015-02-03 DIAGNOSIS — J690 Pneumonitis due to inhalation of food and vomit: Secondary | ICD-10-CM | POA: Diagnosis present

## 2015-02-03 DIAGNOSIS — Z7982 Long term (current) use of aspirin: Secondary | ICD-10-CM | POA: Diagnosis not present

## 2015-02-03 DIAGNOSIS — E871 Hypo-osmolality and hyponatremia: Secondary | ICD-10-CM | POA: Diagnosis present

## 2015-02-03 DIAGNOSIS — Z96653 Presence of artificial knee joint, bilateral: Secondary | ICD-10-CM | POA: Diagnosis present

## 2015-02-03 DIAGNOSIS — J9601 Acute respiratory failure with hypoxia: Secondary | ICD-10-CM | POA: Diagnosis not present

## 2015-02-03 DIAGNOSIS — E1022 Type 1 diabetes mellitus with diabetic chronic kidney disease: Secondary | ICD-10-CM | POA: Diagnosis present

## 2015-02-03 DIAGNOSIS — N170 Acute kidney failure with tubular necrosis: Secondary | ICD-10-CM | POA: Diagnosis not present

## 2015-02-03 DIAGNOSIS — Z794 Long term (current) use of insulin: Secondary | ICD-10-CM

## 2015-02-03 DIAGNOSIS — I2510 Atherosclerotic heart disease of native coronary artery without angina pectoris: Secondary | ICD-10-CM | POA: Diagnosis present

## 2015-02-03 DIAGNOSIS — E872 Acidosis: Secondary | ICD-10-CM | POA: Diagnosis present

## 2015-02-03 DIAGNOSIS — Z66 Do not resuscitate: Secondary | ICD-10-CM | POA: Diagnosis present

## 2015-02-03 DIAGNOSIS — Z8673 Personal history of transient ischemic attack (TIA), and cerebral infarction without residual deficits: Secondary | ICD-10-CM | POA: Diagnosis not present

## 2015-02-03 DIAGNOSIS — Z87891 Personal history of nicotine dependence: Secondary | ICD-10-CM | POA: Diagnosis not present

## 2015-02-03 DIAGNOSIS — I5041 Acute combined systolic (congestive) and diastolic (congestive) heart failure: Secondary | ICD-10-CM | POA: Diagnosis present

## 2015-02-03 DIAGNOSIS — N179 Acute kidney failure, unspecified: Secondary | ICD-10-CM

## 2015-02-03 DIAGNOSIS — Z515 Encounter for palliative care: Secondary | ICD-10-CM | POA: Diagnosis not present

## 2015-02-03 DIAGNOSIS — N189 Chronic kidney disease, unspecified: Secondary | ICD-10-CM

## 2015-02-03 DIAGNOSIS — J9600 Acute respiratory failure, unspecified whether with hypoxia or hypercapnia: Secondary | ICD-10-CM | POA: Insufficient documentation

## 2015-02-03 DIAGNOSIS — I2699 Other pulmonary embolism without acute cor pulmonale: Secondary | ICD-10-CM | POA: Diagnosis present

## 2015-02-03 DIAGNOSIS — I959 Hypotension, unspecified: Secondary | ICD-10-CM | POA: Diagnosis not present

## 2015-02-03 DIAGNOSIS — M7989 Other specified soft tissue disorders: Secondary | ICD-10-CM | POA: Diagnosis not present

## 2015-02-03 DIAGNOSIS — R29898 Other symptoms and signs involving the musculoskeletal system: Secondary | ICD-10-CM

## 2015-02-03 LAB — I-STAT ARTERIAL BLOOD GAS, ED
Acid-base deficit: 3 mmol/L — ABNORMAL HIGH (ref 0.0–2.0)
Bicarbonate: 21.7 mEq/L (ref 20.0–24.0)
O2 Saturation: 92 %
PCO2 ART: 36.2 mmHg (ref 35.0–45.0)
PH ART: 7.383 (ref 7.350–7.450)
TCO2: 23 mmol/L (ref 0–100)
pO2, Arterial: 62 mmHg — ABNORMAL LOW (ref 80.0–100.0)

## 2015-02-03 LAB — CBC WITH DIFFERENTIAL/PLATELET
BASOS ABS: 0 10*3/uL (ref 0.0–0.1)
Basophils Relative: 0 %
EOS PCT: 0 %
Eosinophils Absolute: 0 10*3/uL (ref 0.0–0.7)
HEMATOCRIT: 38.1 % (ref 36.0–46.0)
HEMOGLOBIN: 12 g/dL (ref 12.0–15.0)
LYMPHS PCT: 14 %
Lymphs Abs: 3.5 10*3/uL (ref 0.7–4.0)
MCH: 27.3 pg (ref 26.0–34.0)
MCHC: 31.5 g/dL (ref 30.0–36.0)
MCV: 86.6 fL (ref 78.0–100.0)
MONOS PCT: 5 %
Monocytes Absolute: 1.3 10*3/uL — ABNORMAL HIGH (ref 0.1–1.0)
NEUTROS PCT: 81 %
Neutro Abs: 20.4 10*3/uL — ABNORMAL HIGH (ref 1.7–7.7)
Platelets: 670 10*3/uL — ABNORMAL HIGH (ref 150–400)
RBC: 4.4 MIL/uL (ref 3.87–5.11)
RDW: 16.4 % — AB (ref 11.5–15.5)
WBC: 25.2 10*3/uL — AB (ref 4.0–10.5)

## 2015-02-03 LAB — I-STAT CHEM 8, ED
BUN: 72 mg/dL — ABNORMAL HIGH (ref 6–20)
CHLORIDE: 97 mmol/L — AB (ref 101–111)
Calcium, Ion: 1.01 mmol/L — ABNORMAL LOW (ref 1.13–1.30)
Creatinine, Ser: 3.3 mg/dL — ABNORMAL HIGH (ref 0.44–1.00)
GLUCOSE: 194 mg/dL — AB (ref 65–99)
HCT: 41 % (ref 36.0–46.0)
HEMOGLOBIN: 13.9 g/dL (ref 12.0–15.0)
POTASSIUM: 4.2 mmol/L (ref 3.5–5.1)
SODIUM: 132 mmol/L — AB (ref 135–145)
TCO2: 22 mmol/L (ref 0–100)

## 2015-02-03 LAB — I-STAT TROPONIN, ED: Troponin i, poc: 0.04 ng/mL (ref 0.00–0.08)

## 2015-02-03 LAB — BASIC METABOLIC PANEL
Anion gap: 20 — ABNORMAL HIGH (ref 5–15)
BUN: 80 mg/dL — AB (ref 6–20)
CHLORIDE: 93 mmol/L — AB (ref 101–111)
CO2: 20 mmol/L — ABNORMAL LOW (ref 22–32)
Calcium: 8.7 mg/dL — ABNORMAL LOW (ref 8.9–10.3)
Creatinine, Ser: 3.39 mg/dL — ABNORMAL HIGH (ref 0.44–1.00)
GFR, EST AFRICAN AMERICAN: 14 mL/min — AB (ref >60)
GFR, EST NON AFRICAN AMERICAN: 12 mL/min — AB (ref >60)
Glucose, Bld: 193 mg/dL — ABNORMAL HIGH (ref 65–99)
POTASSIUM: 4.5 mmol/L (ref 3.5–5.1)
Sodium: 133 mmol/L — ABNORMAL LOW (ref 135–145)

## 2015-02-03 LAB — I-STAT CG4 LACTIC ACID, ED
LACTIC ACID, VENOUS: 4.63 mmol/L — AB (ref 0.5–2.0)
Lactic Acid, Venous: 3.57 mmol/L (ref 0.5–2.0)

## 2015-02-03 LAB — LACTIC ACID, PLASMA: Lactic Acid, Venous: 3.4 mmol/L (ref 0.5–2.0)

## 2015-02-03 LAB — BRAIN NATRIURETIC PEPTIDE: B NATRIURETIC PEPTIDE 5: 93 pg/mL (ref 0.0–100.0)

## 2015-02-03 MED ORDER — VANCOMYCIN HCL 10 G IV SOLR
1500.0000 mg | Freq: Once | INTRAVENOUS | Status: AC
  Administered 2015-02-03: 1500 mg via INTRAVENOUS
  Filled 2015-02-03: qty 1500

## 2015-02-03 MED ORDER — SODIUM CHLORIDE 0.9 % IV BOLUS (SEPSIS)
1000.0000 mL | Freq: Once | INTRAVENOUS | Status: AC
  Administered 2015-02-03: 1000 mL via INTRAVENOUS

## 2015-02-03 MED ORDER — ASPIRIN 81 MG PO CHEW
81.0000 mg | CHEWABLE_TABLET | Freq: Every day | ORAL | Status: DC
  Administered 2015-02-04 – 2015-02-09 (×6): 81 mg via ORAL
  Filled 2015-02-03 (×6): qty 1

## 2015-02-03 MED ORDER — PHENYLEPHRINE HCL 10 MG/ML IJ SOLN
30.0000 ug/min | INTRAVENOUS | Status: DC
  Administered 2015-02-03 – 2015-02-04 (×2): 30 ug/min via INTRAVENOUS
  Filled 2015-02-03 (×3): qty 1

## 2015-02-03 MED ORDER — NOREPINEPHRINE BITARTRATE 1 MG/ML IV SOLN
0.0000 ug/min | Freq: Once | INTRAVENOUS | Status: AC
  Administered 2015-02-03: 2 ug/min via INTRAVENOUS
  Filled 2015-02-03 (×2): qty 4

## 2015-02-03 MED ORDER — ALBUTEROL SULFATE (2.5 MG/3ML) 0.083% IN NEBU: 2.5000 mg | INHALATION_SOLUTION | RESPIRATORY_TRACT | Status: DC | PRN

## 2015-02-03 MED ORDER — SODIUM CHLORIDE 0.9 % IV SOLN
1.0000 g | Freq: Once | INTRAVENOUS | Status: AC
  Administered 2015-02-03: 1 g via INTRAVENOUS
  Filled 2015-02-03: qty 10

## 2015-02-03 MED ORDER — VANCOMYCIN HCL IN DEXTROSE 1-5 GM/200ML-% IV SOLN
1000.0000 mg | INTRAVENOUS | Status: DC
  Administered 2015-02-05: 1000 mg via INTRAVENOUS
  Filled 2015-02-03: qty 200

## 2015-02-03 MED ORDER — SODIUM CHLORIDE 0.9 % IV SOLN: 250.0000 mL | INTRAVENOUS | Status: DC | PRN

## 2015-02-03 MED ORDER — CETYLPYRIDINIUM CHLORIDE 0.05 % MT LIQD
7.0000 mL | Freq: Two times a day (BID) | OROMUCOSAL | Status: DC
Start: 2015-02-04 — End: 2015-02-04
  Administered 2015-02-04: 7 mL via OROMUCOSAL

## 2015-02-03 MED ORDER — HEPARIN SODIUM (PORCINE) 5000 UNIT/ML IJ SOLN
5000.0000 [IU] | Freq: Three times a day (TID) | INTRAMUSCULAR | Status: DC
  Administered 2015-02-04 – 2015-02-09 (×15): 5000 [IU] via SUBCUTANEOUS
  Filled 2015-02-03 (×15): qty 1

## 2015-02-03 MED ORDER — DEXTROSE 5 % IV SOLN
1.0000 g | INTRAVENOUS | Status: DC
  Administered 2015-02-03 – 2015-02-06 (×4): 1 g via INTRAVENOUS
  Filled 2015-02-03 (×5): qty 1

## 2015-02-03 MED ORDER — INSULIN ASPART 100 UNIT/ML ~~LOC~~ SOLN
0.0000 [IU] | SUBCUTANEOUS | Status: DC
  Administered 2015-02-04 (×2): 1 [IU] via SUBCUTANEOUS
  Administered 2015-02-04: 3 [IU] via SUBCUTANEOUS
  Administered 2015-02-04: 1 [IU] via SUBCUTANEOUS
  Administered 2015-02-04: 5 [IU] via SUBCUTANEOUS
  Administered 2015-02-04: 2 [IU] via SUBCUTANEOUS

## 2015-02-03 MED ORDER — CHLORHEXIDINE GLUCONATE 0.12 % MT SOLN
15.0000 mL | Freq: Two times a day (BID) | OROMUCOSAL | Status: DC
  Administered 2015-02-04: 15 mL via OROMUCOSAL

## 2015-02-03 MED ORDER — SODIUM CHLORIDE 0.9 % IV SOLN
INTRAVENOUS | Status: DC
  Administered 2015-02-03 – 2015-02-04 (×2): via INTRAVENOUS

## 2015-02-03 MED ORDER — CLOPIDOGREL BISULFATE 75 MG PO TABS
75.0000 mg | ORAL_TABLET | Freq: Every day | ORAL | Status: DC
  Administered 2015-02-04 – 2015-02-09 (×6): 75 mg via ORAL
  Filled 2015-02-03 (×6): qty 1

## 2015-02-03 MED ORDER — IPRATROPIUM-ALBUTEROL 0.5-2.5 (3) MG/3ML IN SOLN
3.0000 mL | Freq: Four times a day (QID) | RESPIRATORY_TRACT | Status: DC
  Administered 2015-02-03 – 2015-02-05 (×8): 3 mL via RESPIRATORY_TRACT
  Filled 2015-02-03 (×7): qty 3

## 2015-02-03 NOTE — ED Notes (Signed)
Pharmacy called for levophed drip 

## 2015-02-03 NOTE — ED Notes (Signed)
Called Dr. Regenia Skeeter to report bp 85/48, MD acknowledges. No new orders.

## 2015-02-03 NOTE — H&P (Signed)
PULMONARY / CRITICAL CARE MEDICINE   Name: Brenda Hogan MRN: [id number] DOB: 1934/09/20    ADMISSION DATE:  02/03/2015 CONSULTATION DATE:  02/03/15  REFERRING MD :  EDP  CHIEF COMPLAINT:  SOB  INITIAL PRESENTATION:  80 y.o. F brought to [facility] ED 9/29 with HCAP.  Placed on BiPAP and PCCM called for admission.  She is severely debilitated as she has been bed bound for past 10 years.    STUDIES:  CXR 9/29 >>> slight density at right lung base.  SIGNIFICANT EVENTS: 9/29 - admit.   HISTORY OF PRESENT ILLNESS:  Pt is on BiPAP; therefore, this HPI is obtained from chart review and family members (daughter and grandson). Brenda Hogan is a 24 y.o. F with PMH as outlined below including being bed bound for the past 10 years after failing knee replacement surgery.  She was brought to [facility] ED 9/29 with SOB x 2 days that had gradually worsened.  Yolanda Bonine takes care of her at home and states on evening of 9/29 while trying to turn her (due to her pressure sores), she had drop in SpO2 to 58%.  She is on 3L O2 24/7 and son states her SpO2 did not improve despite him increasing dose of O2.  She had not been coughing or wheezing.  No chest pain, N/V/D, abd pain, myalgias, fevers/chills/sweats.  She did have admission to [facility] 12/21/14 through 12/28/14 for sepsis due to aspiration.  In ED, she remained hypoxic with poor respiratory effort so was placed on BiPAP.  She received 2L IVF and BP remained low with SBP in the 80's.  PCCM was consulted for admission.   PAST MEDICAL HISTORY :   has a past medical history of Stroke; COPD (chronic obstructive pulmonary disease); Coronary artery disease; Hypertension; and Diabetes mellitus type 1.  has past surgical history that includes Total knee arthroplasty (Bilateral). Prior to Admission medications   Medication Sig Start Date End Date Taking? Authorizing Provider  albuterol (PROVENTIL HFA;VENTOLIN HFA) 108 (90 BASE) MCG/ACT inhaler Inhale 2 puffs into  the lungs every 6 (six) hours as needed for wheezing or shortness of breath. 2 puffs 3 times daily x 3 days then use as needed. 05/12/13   Eugenie Filler, MD  ALPRAZolam Duanne Moron) 0.25 MG tablet Take 1 tablet (0.25 mg total) by mouth 2 (two) times daily as needed for anxiety. Patient not taking: Reported on 12/22/2014 05/12/13   Eugenie Filler, MD  amoxicillin-clavulanate (AUGMENTIN) 875-125 MG per tablet Take 1 tablet by mouth 2 (two) times daily. 12/28/14   Max Sane, MD  aspirin 81 MG chewable tablet Chew 1 tablet (81 mg total) by mouth daily. Patient not taking: Reported on 10/30/2014 05/12/13   Eugenie Filler, MD  atorvastatin (LIPITOR) 40 MG tablet Take 1 tablet (40 mg total) by mouth daily at 6 PM. Patient not taking: Reported on 12/22/2014 05/12/13   Eugenie Filler, MD  clopidogrel (PLAVIX) 75 MG tablet Take 75 mg by mouth daily. 10/15/14   Historical Provider, MD  feeding supplement, GLUCERNA SHAKE, (GLUCERNA SHAKE) LIQD Take 237 mLs by mouth 2 (two) times daily between meals. 05/12/13   Eugenie Filler, MD  furosemide (LASIX) 40 MG tablet Take 1 tablet (40 mg total) by mouth daily. Patient not taking: Reported on 10/31/2014 05/12/13   Eugenie Filler, MD  furosemide (LASIX) 40 MG tablet Take 40 mg by mouth every other day.    Historical Provider, MD  insulin detemir (LEVEMIR) 100 UNIT/ML  injection Inject 0.05 mLs (5 Units total) into the skin at bedtime. 12/28/14   Max Sane, MD  ipratropium-albuterol (DUONEB) 0.5-2.5 (3) MG/3ML SOLN Take 3 mLs by nebulization 2 (two) times daily. 05/12/13   Eugenie Filler, MD  isosorbide-hydrALAZINE (BIDIL) 20-37.5 MG per tablet Take 1 tablet by mouth 3 (three) times daily. Patient not taking: Reported on 10/30/2014 05/12/13   Eugenie Filler, MD  lisinopril (PRINIVIL,ZESTRIL) 2.5 MG tablet Take 1 tablet (2.5 mg total) by mouth daily. 05/12/13   Eugenie Filler, MD  metoprolol (LOPRESSOR) 50 MG tablet Take 1 tablet (50 mg total) by mouth 2 (two) times  daily. Patient taking differently: Take 50 mg by mouth daily.  05/12/13   Eugenie Filler, MD  mirtazapine (REMERON) 15 MG tablet Take 15 mg by mouth daily.    Historical Provider, MD  potassium chloride SA (K-DUR,KLOR-CON) 20 MEQ tablet Take 1 tablet (20 mEq total) by mouth 2 (two) times daily. 12/28/14   Max Sane, MD  rosuvastatin (CRESTOR) 10 MG tablet Take 10 mg by mouth daily.    Historical Provider, MD  traMADol (ULTRAM) 50 MG tablet Take 50 mg by mouth every 6 (six) hours as needed for moderate pain.    Historical Provider, MD   No Known Allergies  FAMILY HISTORY:  Family History  Problem Relation Age of Onset  . CAD      SOCIAL HISTORY:  reports that she has quit smoking. She has never used smokeless tobacco. She reports that she does not drink alcohol or use illicit drugs.  REVIEW OF SYSTEMS:  Unable to obtain as pt is on BiPAP and per daughter, does not talk much.  SUBJECTIVE:   VITAL SIGNS: Temp:  [97.2 F (36.2 C)] 97.2 F (36.2 C) (09/29 1835) Pulse Rate:  [98-110] 100 (09/29 2111) Resp:  [16-100] 24 (09/29 2111) BP: (94-143)/(48-95) 143/74 mmHg (09/29 2111) SpO2:  [85 %-100 %] 100 % (09/29 2111) FiO2 (%):  [100 %] 100 % (09/29 1935) Weight:  [63.504 kg (140 lb)] 63.504 kg (140 lb) (09/29 1823) HEMODYNAMICS:   VENTILATOR SETTINGS: Vent Mode:  [-] BIPAP FiO2 (%):  [100 %] 100 % Set Rate:  [10 bmp] 10 bmp PEEP:  [7 cmH20] 7 cmH20 INTAKE / OUTPUT: Intake/Output      09/29 0701 - 09/30 0700   I.V. (mL/kg) 1050 (16.5)   Total Intake(mL/kg) 1050 (16.5)   Net +1050         PHYSICAL EXAMINATION: General: Elderly chronically ill appearing female, in NAD. Neuro: Awake.  Nods head in response to basic questions. HEENT: [place]/AT. Mucous membranes dry. BiPAP in place. Cardiovascular: RRR, no M/R/G.  Lungs: Respirations shallow but unlabored.  Coarse bilaterally. Abdomen: BS x 4, soft, NT/ND.  Musculoskeletal: R hand contracted (per daughter this is chronic after  her CVA), no edema.  Skin: Intact, dry, warm, no rashes.  LABS:  CBC  Recent Labs Lab 02/03/15 1847 02/03/15 1854  WBC 25.2*  --   HGB 12.0 13.9  HCT 38.1 41.0  PLT 670*  --    Coag's No results for input(s): APTT, INR in the last 168 hours. BMET  Recent Labs Lab 02/03/15 1847 02/03/15 1854  NA 133* 132*  K 4.5 4.2  CL 93* 97*  CO2 20*  --   BUN 80* 72*  CREATININE 3.39* 3.30*  GLUCOSE 193* 194*   Electrolytes  Recent Labs Lab 02/03/15 1847  CALCIUM 8.7*   Sepsis Markers  Recent Labs Lab 02/03/15 1934  LATICACIDVEN 4.63*   ABG  Recent Labs Lab 02/03/15 1842  PHART 7.383  PCO2ART 36.2  PO2ART 62.0*   Liver Enzymes No results for input(s): AST, ALT, ALKPHOS, BILITOT, ALBUMIN in the last 168 hours. Cardiac Enzymes No results for input(s): TROPONINI, PROBNP in the last 168 hours. Glucose No results for input(s): GLUCAP in the last 168 hours.  Imaging Dg Chest Port 1 View  02/03/2015   CLINICAL DATA:  Respiratory distress  EXAM: PORTABLE CHEST - 1 VIEW  COMPARISON:  12/25/2014  FINDINGS: Cardiac shadow is stable. Patient is rotated to the right accentuating the mediastinal markings. The lungs are well aerated bilaterally. Some slight increased density is noted in the right lung base which may represent some early infiltrate. No other focal abnormality is noted.  IMPRESSION: Slight increased density in the right lung base. This may represent some early infiltrate or residual from previously noted infiltrate.   Electronically Signed   By: Inez Catalina M.D.   On: 02/03/2015 18:37     ASSESSMENT / PLAN:  PULMONARY A: Acute on chronic hypoxic respiratory failure. HCAP. Hx COPD by report - no PFT's in system. DNI STATUS. P:   Continue supplemental O2 as needed to maintain SpO2 > 92%. BiPAP PRN. Abx per ID section. DuoNebs / Albuterol. Pulmonary hygiene. CXR in AM.  CARDIOVASCULAR A:  Hypotension - likely hypovolemic due to decreased PO intake  over the past few days vs sepsis. Hx CAD, HTN. DNR STATUS. P:  Low dose neosynephrine as needed - no CVL placement. Hold outpatient bidil, lisinopril, lopressor.  RENAL A:   Hyponatremia. AoCKD - baseline SCr ~ 1.1. AG metabolic acidosis - lactate. Hypocalcemia. P:   NS @ 100. Repeat lactate. 1g Ca gluconate. BMP in AM.  GASTROINTESTINAL A:   Nutrition. P:   NPO. Swallow eval.  HEMATOLOGIC A:   VTE Prophylaxis. P:  SCD's / heparin SQ. CBC in AM.  INFECTIOUS A:   HCAP. Sacral decub ulcers. P:   BCx2 9/29 > Sputum Cx 9/29 > Sacral wound 9/29 > Abx: Vanc, start date 9/29, day 1/x. Abx: Ceftaz, start date 9/29, day 1/x. Wound care consult.  ENDOCRINE A:   DM.   P:   SSI. Hold outpatient levemir.  NEUROLOGIC A:   Generalized deconditioning / Failure to thrive. 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 P:   Palliative care consult for goals of care and potential home with hospice. Continue outpatient ASA, plavix. Hold outpatient alprazolam, mirtazapine.   Family updated: Daughter and grandson at bedside.  They are in agreement to meet with palliative care given the rough past few months Mrs. Carpenito has had.  Interdisciplinary Family Meeting v Palliative Care Meeting:  Due by: 10/5.  CC time:  35 minutes.   Montey Hora, [place] [facility] Pager: [phone]  or [phone] 02/03/2015, 9:42 PM

## 2015-02-03 NOTE — Progress Notes (Signed)
Patient arrived via EMS on CPAP.  Was placed on Bipap without complications.  Patient is currently tolerating well.  ABG was obtained and results given to MD.  Will continue to monitor.

## 2015-02-03 NOTE — ED Notes (Signed)
Code sepsis called by Regenia Skeeter MD.  Urban Gibson secretary made aware.

## 2015-02-03 NOTE — ED Notes (Signed)
Phlebotomy at the bedside  

## 2015-02-03 NOTE — Progress Notes (Signed)
Unable to obtain ABG order for 1930. RT made 2 attempts to draw ABG. Neither was successful. ER MD is aware and ok to wait.

## 2015-02-03 NOTE — ED Notes (Signed)
Pt reports increased SOB that began two days ago.  Pt arrived to ED on CPAP with sats at 85%, pt was 58% on RA upon EMS arrival.

## 2015-02-03 NOTE — Progress Notes (Signed)
Report given to [place], RT. Pt being transported to 2MW by Dub Mikes, RT.

## 2015-02-03 NOTE — ED Notes (Addendum)
Fuller Song, PA-C with critical care, at the bedside.

## 2015-02-03 NOTE — ED Notes (Signed)
Called receiving unit.

## 2015-02-03 NOTE — ED Notes (Signed)
rspiratory therapist notified of transfer.

## 2015-02-03 NOTE — ED Notes (Signed)
Phlebotomy on the way to draw new lab orders.

## 2015-02-03 NOTE — ED Notes (Signed)
Awaiting respiratory therapist to assist with transport.

## 2015-02-03 NOTE — Progress Notes (Addendum)
ANTIBIOTIC CONSULT NOTE - INITIAL  Pharmacy Consult for vancomycin/ceftazidime Indication: PNA  No Known Allergies  Patient Measurements: Height: 5\' 2"  (157.5 cm) Weight: 140 lb (63.504 kg) IBW/kg (Calculated) : 50.1   Vital Signs: Temp: 97.2 F (36.2 C) (09/29 1835) Temp Source: Rectal (09/29 1835) BP: 99/52 mmHg (09/29 1900) Pulse Rate: 101 (09/29 1900) Intake/Output from previous day:   Intake/Output from this shift:    Labs:  Recent Labs  02/03/15 1847 02/03/15 1854  WBC 25.2*  --   HGB 12.0 13.9  PLT 670*  --   CREATININE  --  3.30*   Estimated Creatinine Clearance: 12.5 mL/min (by C-G formula based on Cr of 3.3). No results for input(s): VANCOTROUGH, VANCOPEAK, VANCORANDOM, GENTTROUGH, GENTPEAK, GENTRANDOM, TOBRATROUGH, TOBRAPEAK, TOBRARND, AMIKACINPEAK, AMIKACINTROU, AMIKACIN in the last 72 hours.   Microbiology: No results found for this or any previous visit (from the past 720 hour(s)).  Medical History: Past Medical History  Diagnosis Date  . Stroke   . COPD (chronic obstructive pulmonary disease)   . Coronary artery disease   . Hypertension   . Diabetes mellitus type 1     Assessment: 44 yof presenting with respiratory distress x 2 days. Code sepsis called @1911  - abx handed to RN at 1927, instructed to give ceftaz first. Pharmacy consulted to dose vanc/ceftaz for PNA/sepsis. WBC 25.2, afebrile. AKI - SCr 3.3 on admit (up from 1.06 on 8/23), CrCl~12  9/29 vanc>> 9/29 ceftaz>>  9/29 BCx2>>  Goal of Therapy:  Vancomycin trough level 15-20 mcg/ml  Plan:  Vanc 1500mg  IV x 1 dose; then vanc 1g IV q48h Ceftazidime 1g IV q24h Mon clinical progress, c/s, abx plan VT@SS  as indicated Monitor renal function closely  Elicia Lamp, PharmD Clinical Pharmacist Pager [phone] 02/03/2015 7:08 PM

## 2015-02-03 NOTE — ED Provider Notes (Signed)
CSN: [id number]     Arrival date & time 02/03/15  1819 History   First MD Initiated Contact with Patient 02/03/15 1823     Chief Complaint  Patient presents with  . Respiratory Distress     (Consider location/radiation/quality/duration/timing/severity/associated sxs/prior Treatment) Patient is a 80 y.o. female presenting with shortness of breath.  Shortness of Breath Severity:  Severe Onset quality:  Gradual Timing:  Constant Progression:  Worsening Chronicity:  Chronic Associated symptoms: sputum production   Associated symptoms: no abdominal pain, no chest pain, no cough and no vomiting     Past Medical History  Diagnosis Date  . Stroke   . COPD (chronic obstructive pulmonary disease)   . Coronary artery disease   . Hypertension   . Diabetes mellitus type 1    Past Surgical History  Procedure Laterality Date  . Total knee arthroplasty Bilateral    Family History  Problem Relation Age of Onset  . CAD     Social History  Substance Use Topics  . Smoking status: Former Research scientist (life sciences)  . Smokeless tobacco: Never Used  . Alcohol Use: No   OB History    No data available     Review of Systems  Respiratory: Positive for sputum production and shortness of breath. Negative for cough.   Cardiovascular: Negative for chest pain.  Gastrointestinal: Positive for nausea. Negative for vomiting and abdominal pain.  All other systems reviewed and are negative.     Allergies  Review of patient's allergies indicates no known allergies.  Home Medications   Prior to Admission medications   Medication Sig Start Date End Date Taking? Authorizing Provider  albuterol (PROVENTIL HFA;VENTOLIN HFA) 108 (90 BASE) MCG/ACT inhaler Inhale 2 puffs into the lungs every 6 (six) hours as needed for wheezing or shortness of breath. 2 puffs 3 times daily x 3 days then use as needed. 05/12/13   Eugenie Filler, MD  ALPRAZolam Duanne Moron) 0.25 MG tablet Take 1 tablet (0.25 mg total) by mouth 2 (two)  times daily as needed for anxiety. Patient not taking: Reported on 12/22/2014 05/12/13   Eugenie Filler, MD  amoxicillin-clavulanate (AUGMENTIN) 875-125 MG per tablet Take 1 tablet by mouth 2 (two) times daily. 12/28/14   Max Sane, MD  aspirin 81 MG chewable tablet Chew 1 tablet (81 mg total) by mouth daily. Patient not taking: Reported on 10/30/2014 05/12/13   Eugenie Filler, MD  atorvastatin (LIPITOR) 40 MG tablet Take 1 tablet (40 mg total) by mouth daily at 6 PM. Patient not taking: Reported on 12/22/2014 05/12/13   Eugenie Filler, MD  clopidogrel (PLAVIX) 75 MG tablet Take 75 mg by mouth daily. 10/15/14   Historical Provider, MD  feeding supplement, GLUCERNA SHAKE, (GLUCERNA SHAKE) LIQD Take 237 mLs by mouth 2 (two) times daily between meals. 05/12/13   Eugenie Filler, MD  furosemide (LASIX) 40 MG tablet Take 1 tablet (40 mg total) by mouth daily. Patient not taking: Reported on 10/31/2014 05/12/13   Eugenie Filler, MD  furosemide (LASIX) 40 MG tablet Take 40 mg by mouth every other day.    Historical Provider, MD  insulin detemir (LEVEMIR) 100 UNIT/ML injection Inject 0.05 mLs (5 Units total) into the skin at bedtime. 12/28/14   Max Sane, MD  ipratropium-albuterol (DUONEB) 0.5-2.5 (3) MG/3ML SOLN Take 3 mLs by nebulization 2 (two) times daily. 05/12/13   Eugenie Filler, MD  isosorbide-hydrALAZINE (BIDIL) 20-37.5 MG per tablet Take 1 tablet by mouth 3 (three) times daily.  Patient not taking: Reported on 10/30/2014 05/12/13   Eugenie Filler, MD  lisinopril (PRINIVIL,ZESTRIL) 2.5 MG tablet Take 1 tablet (2.5 mg total) by mouth daily. 05/12/13   Eugenie Filler, MD  metoprolol (LOPRESSOR) 50 MG tablet Take 1 tablet (50 mg total) by mouth 2 (two) times daily. Patient taking differently: Take 50 mg by mouth daily.  05/12/13   Eugenie Filler, MD  mirtazapine (REMERON) 15 MG tablet Take 15 mg by mouth daily.    Historical Provider, MD  potassium chloride SA (K-DUR,KLOR-CON) 20 MEQ tablet Take 1  tablet (20 mEq total) by mouth 2 (two) times daily. 12/28/14   Max Sane, MD  rosuvastatin (CRESTOR) 10 MG tablet Take 10 mg by mouth daily.    Historical Provider, MD  traMADol (ULTRAM) 50 MG tablet Take 50 mg by mouth every 6 (six) hours as needed for moderate pain.    Historical Provider, MD   SpO2 88% Physical Exam  Constitutional: She is oriented to person, place, and time. She appears distressed.  HENT:  Head: Normocephalic.  Patient appears dehydrated  Eyes: Pupils are equal, round, and reactive to light.  Neck: Normal range of motion.  Cardiovascular:  No murmur heard. Tachycardic   Pulmonary/Chest: She is in respiratory distress. She has wheezes. She has rales.  Abdominal: Soft. She exhibits no distension.  Musculoskeletal: Normal range of motion.  Neurological: She is alert and oriented to person, place, and time.  Right sided weakness  Skin: Skin is warm and dry. She is not diaphoretic.  Nursing note and vitals reviewed.   ED Course  Procedures (including critical care time) Labs Review Labs Reviewed  BASIC METABOLIC PANEL - Abnormal; Notable for the following:    Sodium 133 (*)    Chloride 93 (*)    CO2 20 (*)    Glucose, Bld 193 (*)    BUN 80 (*)    Creatinine, Ser 3.39 (*)    Calcium 8.7 (*)    GFR calc non Af Amer 12 (*)    GFR calc Af Amer 14 (*)    Anion gap 20 (*)    All other components within normal limits  CBC WITH DIFFERENTIAL/PLATELET - Abnormal; Notable for the following:    WBC 25.2 (*)    RDW 16.4 (*)    Platelets 670 (*)    Neutro Abs 20.4 (*)    Monocytes Absolute 1.3 (*)    All other components within normal limits  I-STAT ARTERIAL BLOOD GAS, ED - Abnormal; Notable for the following:    pO2, Arterial 62.0 (*)    Acid-base deficit 3.0 (*)    All other components within normal limits  I-STAT CHEM 8, ED - Abnormal; Notable for the following:    Sodium 132 (*)    Chloride 97 (*)    BUN 72 (*)    Creatinine, Ser 3.30 (*)    Glucose, Bld  194 (*)    Calcium, Ion 1.01 (*)    All other components within normal limits  I-STAT CG4 LACTIC ACID, ED - Abnormal; Notable for the following:    Lactic Acid, Venous 4.63 (*)    All other components within normal limits  CULTURE, BLOOD (ROUTINE X 2)  CULTURE, BLOOD (ROUTINE X 2)  BRAIN NATRIURETIC PEPTIDE  BLOOD GAS, ARTERIAL  I-STAT TROPOININ, ED   Imaging Review No results found. I have personally reviewed and evaluated these images and lab results as part of my medical decision-making.   EKG Interpretation  None      MDM   Brenda Hogan is a 80 year old female presents more department today with shortness of breath has been ongoing for the past 2 days. She denies seeing cough sputum or fevers. She has a known history of coronary artery disease, diabetes, hypertension, and COPD.  EMS found patient and heard inspiratory wheezing along with diffuse rales. Patient was treated with for COPD exacerbation with Solu-Medrol, magnesium, albuterol Atrovent. Placed patient on CPAP where she was sating at 88%.  Placed on BiPAP here with improvement in sats. Patient found to have a creatinine of 3.3 from her baseline of 1.1. Leukocytosis and concern for right-sided pneumonia. Patient was called could sepsis and given antibiotics as well as normal saline bolus. Patient started on norepi drip due to low blood pressures. Continues to want to be DNR/DNI. Patient admitted to ICU.   Final diagnoses:  Acute on chronic respiratory failure with hypoxia  Acute on chronic renal failure  Failure to thrive in adult  HCAP (healthcare-associated pneumonia)  Acute combined systolic and diastolic CHF, NYHA class 1  Sepsis, due to unspecified organism        Roberto Scales, MD 02/04/15 [id number]  Sherwood Gambler, MD 02/06/15 [id number]

## 2015-02-03 NOTE — ED Notes (Signed)
Dr. Martyn Ehrich called to bedside to assess wounds before applying dressings. No new orders.

## 2015-02-03 NOTE — ED Notes (Signed)
Dr. Martyn Ehrich at the bedside. Verbal ordre for levophed drip.

## 2015-02-04 ENCOUNTER — Inpatient Hospital Stay (HOSPITAL_COMMUNITY): Payer: Medicare Other

## 2015-02-04 DIAGNOSIS — Z515 Encounter for palliative care: Secondary | ICD-10-CM

## 2015-02-04 LAB — CBC
HCT: 34.5 % — ABNORMAL LOW (ref 36.0–46.0)
HEMOGLOBIN: 10.9 g/dL — AB (ref 12.0–15.0)
MCH: 27.3 pg (ref 26.0–34.0)
MCHC: 31.6 g/dL (ref 30.0–36.0)
MCV: 86.5 fL (ref 78.0–100.0)
PLATELETS: 648 10*3/uL — AB (ref 150–400)
RBC: 3.99 MIL/uL (ref 3.87–5.11)
RDW: 16.3 % — ABNORMAL HIGH (ref 11.5–15.5)
WBC: 29 10*3/uL — ABNORMAL HIGH (ref 4.0–10.5)

## 2015-02-04 LAB — MRSA PCR SCREENING: MRSA by PCR: NEGATIVE

## 2015-02-04 LAB — PHOSPHORUS: Phosphorus: 6.5 mg/dL — ABNORMAL HIGH (ref 2.5–4.6)

## 2015-02-04 LAB — GLUCOSE, CAPILLARY
GLUCOSE-CAPILLARY: 243 mg/dL — AB (ref 65–99)
GLUCOSE-CAPILLARY: 198 mg/dL — AB (ref 65–99)
Glucose-Capillary: 257 mg/dL — ABNORMAL HIGH (ref 65–99)
Glucose-Capillary: 145 mg/dL — ABNORMAL HIGH (ref 65–99)
Glucose-Capillary: 127 mg/dL — ABNORMAL HIGH (ref 65–99)
Glucose-Capillary: 136 mg/dL — ABNORMAL HIGH (ref 65–99)

## 2015-02-04 LAB — URINALYSIS, ROUTINE W REFLEX MICROSCOPIC
Glucose, UA: NEGATIVE mg/dL
Ketones, ur: NEGATIVE mg/dL
LEUKOCYTES UA: NEGATIVE
NITRITE: NEGATIVE
PH: 5 (ref 5.0–8.0)
Protein, ur: 30 mg/dL — AB
SPECIFIC GRAVITY, URINE: 1.016 (ref 1.005–1.030)
UROBILINOGEN UA: 0.2 mg/dL (ref 0.0–1.0)

## 2015-02-04 LAB — BASIC METABOLIC PANEL
Anion gap: 21 — ABNORMAL HIGH (ref 5–15)
BUN: 69 mg/dL — AB (ref 6–20)
CHLORIDE: 101 mmol/L (ref 101–111)
CO2: 15 mmol/L — ABNORMAL LOW (ref 22–32)
CREATININE: 2.95 mg/dL — AB (ref 0.44–1.00)
Calcium: 8.4 mg/dL — ABNORMAL LOW (ref 8.9–10.3)
GFR calc Af Amer: 17 mL/min — ABNORMAL LOW (ref >60)
GFR calc non Af Amer: 14 mL/min — ABNORMAL LOW (ref >60)
GLUCOSE: 254 mg/dL — AB (ref 65–99)
POTASSIUM: 4.5 mmol/L (ref 3.5–5.1)
SODIUM: 137 mmol/L (ref 135–145)

## 2015-02-04 LAB — MAGNESIUM: MAGNESIUM: 2.9 mg/dL — AB (ref 1.7–2.4)

## 2015-02-04 LAB — URINE MICROSCOPIC-ADD ON

## 2015-02-04 LAB — LACTIC ACID, PLASMA: Lactic Acid, Venous: 2.4 mmol/L (ref 0.5–2.0)

## 2015-02-04 MED ORDER — COLLAGENASE 250 UNIT/GM EX OINT
TOPICAL_OINTMENT | Freq: Every day | CUTANEOUS | Status: DC
  Administered 2015-02-04 – 2015-02-08 (×5): via TOPICAL
  Filled 2015-02-04 (×2): qty 30

## 2015-02-04 NOTE — Progress Notes (Signed)
MBSS complete. Full report located under chart review in imaging section.  

## 2015-02-04 NOTE — Consult Note (Signed)
WOC wound consult note Reason for Consult: pressure ulcers Patient is non ambulatory at home. Possibly has air mattress per patient's report. She does not get up in the [facility] at this time. She reports grandson moves her as much as he can.   Wound type: Sacrum: Unstageable pressure injury Right ischium: Unstageable pressure injury Right upper thoracic area: partial thickness skin abrasions (when questioned patient does recall sheer injury when being pulled up in the bed) these areas do not present like pressure ulcers.  Right lateral malleolus does blanch but it is reddened, will add Prevalon boots for this reason, patient does not move.  And favors that side due to pain  Pressure Ulcer POA: Yes Measurement: Sacrum: 6cm 10cm x 0.1cm Right ischium: 5cm x 10cm x 0.1cm  Right thoracic: 20cm x 5cm x 0.1cm   Wound bed: Sacrum: 95% black/ 5% red at wound edges; fluctuant  centrally and can palpate bone under the soft eschar.  Right ischium: 90% black, 10% red/purple Right thoracic: 100% pink Drainage (amount, consistency, odor) minimal from all sites, no odor, not purulent at this point.  Periwound: intact but this patient does have some abrasions and skin changes over her back especially  Dressing procedure/placement/frequency: Add enzymatic debridement ointment to the right ischial and sacrum for now. I discussed her wishes with her and that the next option for wounds that are this severe would be to have PT perform hydrotherapy.  When I asked her what she wanted to do she states "nothing that hurts".  She has quite a bit of pain with movement, and I am not sure she would even be able to tolerate turning for therapy.   Will follow along to determine wound care needs after goals of care are established. Prevalon boots added for her heels due to immobility.  Sport mattress in place in the ICU, will ask for air mattress to be ordered if she transfers from the ICU.  [place] team will follow along with you  for weekly wound assessments.  Please notify me of any acute changes in the wounds or any new areas of concerns Para March RN,CWOCN [phone]

## 2015-02-04 NOTE — Progress Notes (Signed)
Dr. Ashok Cordia advised of positive blood culture, gram + cocci in clusters in aerobic bottle.

## 2015-02-04 NOTE — Progress Notes (Addendum)
PULMONARY / CRITICAL CARE MEDICINE   Name: Brenda Hogan MRN: [id number] DOB: [birth year]    ADMISSION DATE:  02/03/2015 CONSULTATION DATE:  02/03/15  REFERRING MD :  EDP  CHIEF COMPLAINT:  SOB  INITIAL PRESENTATION:  80 y.o. F brought to [facility] ED 9/29 with HCAP.  Placed on BiPAP and PCCM called for admission.  She is severely debilitated as she has been bed bound for past 10 years.   STUDIES:  CXR 9/29 - light density at right lung base. Portable CXR 9/30 - reviewed by me. Questionable right basilar opacity. Otherwise hyperinflation. Venous Duplex 9/30 - Pending  SIGNIFICANT EVENTS: 9/29 - admit 9/30 - weaned off BiPAP & Neo gtt  SUBJECTIVE: No chest pain or pressure. Denies any difficulty breathing. Denies any coughing. Patient weaned off of vasopressor support & BiPAP this morning.  REVIEW OF SYSTEMS: Denies any nausea, vomiting, or abdominal pain. No headache. Denies any fever or chills.  VITAL SIGNS: Temp:  [97.2 F (36.2 C)-98.1 F (36.7 C)] 98.1 F (36.7 C) (09/30 0800) Pulse Rate:  [80-226] 87 (09/30 1015) Resp:  [11-100] 16 (09/30 1015) BP: (80-156)/(26-135) 123/54 mmHg (09/30 1015) SpO2:  [85 %-100 %] 97 % (09/30 1015) FiO2 (%):  [40 %-100 %] 40 % (09/30 0913) Weight:  [136 lb 3.9 oz (61.8 kg)-140 lb (63.504 kg)] 136 lb 3.9 oz (61.8 kg) (09/30 0350) HEMODYNAMICS:   VENTILATOR SETTINGS: Vent Mode:  [-] BIPAP FiO2 (%):  [40 %-100 %] 40 % Set Rate:  [10 bmp] 10 bmp PEEP:  [7 cmH20-8 cmH20] 8 cmH20 INTAKE / OUTPUT: Intake/Output      09/29 0701 - 09/30 0700 09/30 0701 - 10/01 0700   I.V. (mL/kg) 2232.2 (36.1) 378.8 (6.1)   Total Intake(mL/kg) 2232.2 (36.1) 378.8 (6.1)   Urine (mL/kg/hr) 250    Total Output 250     Net +1982.2 +378.8          PHYSICAL EXAMINATION: General: Elderly female. No distress. Laying in bed watching TV on her right side. Neuro: Oriented to person, place, year, & season. Does have limited mobility on her right. HEENT: Moist  mucous membranes. No scleral injection. Pulmonary: Coarse breath sounds bilaterally. Normal work of breathing on nasal cannula oxygen. Speaking in complete sentences. Cardiovascular: Regular rate. No appreciable JVD. Abdomen:  soft. Nontender. Nondistended. Musculoskeletal:  right hand contracted.  Skin: warm and dry. No rash.  LABS:  CBC  Recent Labs Lab 02/03/15 1847 02/03/15 1854 02/04/15 0049  WBC 25.2*  --  29.0*  HGB 12.0 13.9 10.9*  HCT 38.1 41.0 34.5*  PLT 670*  --  648*   Coag's No results for input(s): APTT, INR in the last 168 hours. BMET  Recent Labs Lab 02/03/15 1847 02/03/15 1854 02/04/15 0049  NA 133* 132* 137  K 4.5 4.2 4.5  CL 93* 97* 101  CO2 20*  --  15*  BUN 80* 72* 69*  CREATININE 3.39* 3.30* 2.95*  GLUCOSE 193* 194* 254*   Electrolytes  Recent Labs Lab 02/03/15 1847 02/04/15 0049  CALCIUM 8.7* 8.4*  MG  --  2.9*  PHOS  --  6.5*   Sepsis Markers  Recent Labs Lab 02/03/15 2204 02/03/15 2222 02/04/15 0049  LATICACIDVEN 3.57* 3.4* 2.4*   ABG  Recent Labs Lab 02/03/15 1842  PHART 7.383  PCO2ART 36.2  PO2ART 62.0*   Liver Enzymes No results for input(s): AST, ALT, ALKPHOS, BILITOT, ALBUMIN in the last 168 hours. Cardiac Enzymes No results for input(s): TROPONINI, PROBNP  in the last 168 hours. Glucose  Recent Labs Lab 02/03/15 2358 02/04/15 0345 02/04/15 0811  GLUCAP 257* 243* 198*    Imaging Dg Chest Port 1 View  02/04/2015   CLINICAL DATA:  Respiratory failure, history of COPD and pneumonia.  EXAM: PORTABLE CHEST 1 VIEW  COMPARISON:  Portable chest x-ray of February 03, 2015  FINDINGS: The patient is rotated on today's study. The lungs are adequately inflated. Increased density at the right lung base persists. This is in part due to the dome of the diaphragm. The heart and pulmonary vascularity are normal. The bony thorax is unremarkable.  IMPRESSION: Allowing for differences in positioning there is no significant  interval change in the appearance of the chest. There is persistent right lower lobe atelectasis or pneumonia. When the patient can tolerate the procedure, a PA and lateral chest x-ray would be useful.   Electronically Signed   By: David  Martinique M.D.   On: 02/04/2015 07:22   Dg Chest Port 1 View  02/03/2015   CLINICAL DATA:  Respiratory distress  EXAM: PORTABLE CHEST - 1 VIEW  COMPARISON:  12/25/2014  FINDINGS: Cardiac shadow is stable. Patient is rotated to the right accentuating the mediastinal markings. The lungs are well aerated bilaterally. Some slight increased density is noted in the right lung base which may represent some early infiltrate. No other focal abnormality is noted.  IMPRESSION: Slight increased density in the right lung base. This may represent some early infiltrate or residual from previously noted infiltrate.   Electronically Signed   By: Inez Catalina M.D.   On: 02/03/2015 18:37     ASSESSMENT / PLAN:  PULMONARY A: Acute on chronic hypoxic respiratory failure - Improving. Now off BiPAP. HCAP/Possible Aspiration Pneumonia H/O COPD by report - no PFT's in system. DNI STATUS  P:   Continue supplemental O2 as needed to maintain SpO2 > 92%. BiPAP PRN. Abx per ID section. DuoNebs / Albuterol. Pulmonary hygiene.  CARDIOVASCULAR A:  Shock - Sepsis versus dehydration H/O CAD H/O HTN DNR STATUS  P:  Weaned off vasopressor infusion D/C IVF Hold outpatient bidil, lisinopril, lopressor given hypotension Holding ASA pending swallow evaluation  RENAL A:   Acute on Chronic Renal Failure - Improving. Likely due to septic shock. Hyponatremia - resolved Metabolic Acidosis Lactic Acidosis - Improving Hyperphosphatemia - Trending daily  P:   NS @ 100. Repeat lactate. 1g Ca gluconate. BMP in AM.  GASTROINTESTINAL A:   Possible aspiration  P:   NPO. Swallow eval pending  HEMATOLOGIC A:   VTE Prophylaxis Anemia - no signs of active bleeding Leukocytosis -  secondary to sepsis  P:  SCD's Heparin SQ q8hr Trending Hgb daily with CBC Trending Leukocytosis daily with CBC  INFECTIOUS A:   HCAP/Possible Aspiration Pneumonia Sacral decub ulcers  P:   Wound care consulted Checking Urine Strep & Legionella Ag  BCx2 9/29 > Sputum Cx 9/29 > Sacral wound 9/29 >  Abx:  Vanc, start date 9/29, day 2/x. Ceftaz, start date 9/29, day 2/x.   ENDOCRINE A:   H/O DM    P:   Accuchecks q4hr Low dose SSI Hold outpatient levemir.  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 Continue outpatient ASA, plavix. Holding outpatient alprazolam, mirtazapine.   Family updated: Family updated at bedside yesterday.  No family at bedside today.  Interdisciplinary Family Meeting v Palliative Care Meeting:  Due by: 10/5.  TODAY'S SUMMARY: 80 year old female admitted in a state of shock with acute hypoxic respiratory failure on chronic hypoxic respiratory failure. Patient weaned off of BiPAP and vasopressor support this morning. Continuing to trend lactic acidosis daily. I am sending urine streptococcal and legionella antigens today. Awaiting blood cultures. Discontinuing IV fluids at this time. Swallowing evaluation by speech is pending to rule out aspiration as a source for the infiltrate in her right lower lobe.  I have spent a total of 31 minutes of critical care time today caring for the patient and reviewing the patient's electronic medical record.  Sonia Baller Ashok Cordia, M.D. [facility] Pulmonary & Critical Care Pager:  [phone] After 3pm or if no response, call [phone]  02/04/2015, 10:21 AM

## 2015-02-04 NOTE — Progress Notes (Signed)
RN took pt off Bipap. RT will continue to monitor.

## 2015-02-04 NOTE — Progress Notes (Signed)
Inpatient Diabetes Program Recommendations  AACE/ADA: New Consensus Statement on Inpatient Glycemic Control (2015)  Target Ranges:  Prepandial:   less than 140 mg/dL      Peak postprandial:   less than 180 mg/dL (1-2 hours)      Critically ill patients:  140 - 180 mg/dL   Results for Brenda Hogan, Brenda Hogan (MRN [id number]) as of 02/04/2015 08:10  Ref. Range 02/04/2015 03:45  Glucose-Capillary Latest Ref Range: 65-99 mg/dL 243 (H)   Review of Glycemic Control  Diabetes history: DM 2 Outpatient Diabetes medications: Levemir 5 units QHS Current orders for Inpatient glycemic control: Novolog Sensitive scale Q4hrs  Inpatient Diabetes Program Recommendations: Insulin - Basal: Glucose is in the mid 200 range. Patient takes Levemir 5 units at home. Please consider starting Levemir 5 units Q24hrs.  Thanks,  Tama Headings RN, MSN, [facility] Inpatient Diabetes Coordinator Team Pager [phone] (8a-5p)

## 2015-02-04 NOTE — Care Management Note (Signed)
Case Management Note  Patient Details  Name: Tamu Golz MRN: [id number] Date of Birth: 06/07/1934  Subjective/Objective:                    Action/Plan:   Expected Discharge Date:                  Expected Discharge Plan:  Long Term Acute Care (LTAC)  In-House Referral:  Clinical Social Work  Discharge planning Services  CM Consult  Post Acute Care Choice:    Choice offered to:     DME Arranged:    DME Agency:     HH Arranged:    [place] Agency:     Status of Service:  In process, will continue to follow  Medicare Important Message Given:    Date Medicare IM Given:    Medicare IM give by:    Date Additional Medicare IM Given:    Additional Medicare Important Message give by:     If discussed at [place] of Stay Meetings, dates discussed:    Additional Comments:  Vergie Living, RN 02/04/2015, 9:04 AM

## 2015-02-04 NOTE — Progress Notes (Signed)
SLP Cancellation Note  Patient Details Name: Brenda Hogan MRN: [id number] DOB: 15-Nov-1934   Cancelled treatment:       Reason Eval/Treat Not Completed: Medical issues which prohibited therapy. Pt on BiPAP. Will check back later today  [facility], [place] CCC-SLP [phone]   Lynann Beaver 02/04/2015, 8:20 AM

## 2015-02-04 NOTE — Evaluation (Signed)
Clinical/Bedside Swallow Evaluation Patient Details  Name: Nyja Westbrook MRN: [id number] Date of Birth: 11/14/1934  Today's Date: 02/04/2015 Time: SLP Start Time (ACUTE ONLY): 40 SLP Stop Time (ACUTE ONLY): 1144 SLP Time Calculation (min) (ACUTE ONLY): 14 min  Past Medical History:  Past Medical History  Diagnosis Date  . Stroke   . COPD (chronic obstructive pulmonary disease)   . Coronary artery disease   . Hypertension   . Diabetes mellitus type 1    Past Surgical History:  Past Surgical History  Procedure Laterality Date  . Total knee arthroplasty Bilateral    HPI:  80 y.o. F brought to [facility] ED 9/29 with HCAP. Placed on BiPAP.  She is severely debilitated as she has been bed bound for past 10 years. MD reports Portable CXR 9/30 shows Questionable right basilar opacity. Otherwise hyperinflation. Possible aspiration pna.    Assessment / Plan / Recommendation Clinical Impression  Pt demonstrates no overt signs of aspiration with thin liquids or puree. She was observed to independently initiate a chin tuck and states that a prior SLP encouraged this. Reinforced basic precautions and discussed MBS due to risk of silent aspiration. Pt may consume a dys 1/thin liquid diet with MBS for objective assessment given MD concern for aspiration pna.     Aspiration Risk  Mild    Diet Recommendation Dysphagia 1 (Puree);Thin   Medication Administration: Crushed with puree    Other  Recommendations Oral Care Recommendations: Oral care BID   Follow Up Recommendations       Frequency and Duration min 2x/week  2 weeks   Pertinent Vitals/Pain NA    SLP Swallow Goals     Swallow Study Prior Functional Status       General Other Pertinent Information: 80 y.o. F brought to [facility] ED 9/29 with HCAP. Placed on BiPAP.  She is severely debilitated as she has been bed bound for past 10 years. MD reports Portable CXR 9/30 shows Questionable right basilar opacity. Otherwise  hyperinflation. Possible aspiration pna.  Type of Study: Bedside swallow evaluation Previous Swallow Assessment: Prior BSE 8/16 - Dy1/thin Diet Prior to this Study: Dysphagia 1 (puree);Thin liquids Temperature Spikes Noted: No Respiratory Status: Room air History of Recent Intubation: No Behavior/Cognition: Alert;Cooperative;Pleasant mood Oral Cavity - Dentition: Edentulous Self-Feeding Abilities: Needs assist Patient Positioning: Partially reclined Baseline Vocal Quality: Normal Volitional Cough: Strong;Congested Volitional Swallow: Able to elicit    Oral/Motor/Sensory Function Overall Oral Motor/Sensory Function: Appears within functional limits for tasks assessed   Ice Chips     Thin Liquid Thin Liquid: Within functional limits Presentation: Cup;Straw;Self Fed    Nectar Thick Nectar Thick Liquid: Not tested   Honey Thick Honey Thick Liquid: Not tested   Puree Puree: Within functional limits   Solid   GO    Solid: Not tested       DeBlois, Katherene Ponto 02/04/2015,12:28 PM

## 2015-02-04 NOTE — Consult Note (Signed)
**Note De-Identified Nikie Cid Obfuscation** Consultation Note Date: 02/04/2015   Patient Name: Brenda Hogan  DOB: [birth year]  MRN: [id number]  Age / Sex: 80 y.o., female   PCP: Alvester Chou, NP Referring Physician: Rigoberto Noel, MD  Reason for Consultation: Establishing goals of care  Palliative Care Assessment and Plan Summary of Established Goals of Care and Medical Treatment Preferences   Clinical Assessment/Narrative: Pt is a 80 yo female admitted with dyspnea, hypoxia. She was found to have RLL pna, HCAP vs aspiration , and placed on BIPAP. Leukocytosis 29, creatinine 2.95. She was just treated at [facility] 8/16 for asp pna. Pt has been severely debilitated after failed knee replacement and CVA. She has right sided weakness and has been bed bound for approx 10 years. Her LE are contracted as is her right hand. She states it hurts too badly to straighten her legs. She is now off BIPAP and pressors. She is alert and able to participate in assessment in terms of answering simple questions. She lives with her 2 daughters, Brenda Hogan and Brenda Hogan, as well as grandson Brenda Hogan. She states since her " man friend" died in 09/10/22 and her health has gotten worse she feels lonely despite being with family. She can no longer see very well so she has been unable to read, or watch TV . She is also hard of hearing now too.  Contacts/Participants in Discussion: Primary Decision Maker: Pt can address simple questions. Would rely on family for advanced health care decisions   HCPOA: Not clear at this point  Pt present. Just now coming off BIPAP and pressors. Have left message for daughter Brenda Hogan @ [phone] in effort to set up [facility] appt this weekend  Code Status/Advance Care Planning:  DNR  Symptom Management:   Pain: Pt now as a sacral decubitus, as well as generalized pain from her bed bound status. She does not take anything other than tylenol. Will restart this now that she is off BIPAP.Monitor for worsening pain. Pain worse with turning and  care  Depression:  Question if pt with recent loss, grief as well as deteriorating health may benefit from SSRI such as lexapro 73m. Will discuss with pt and family during goals discussion.  Palliative Prophylaxis:bowel regimin   Psycho-social/Spiritual:   Support System: family  Desire for further Chaplaincy support:no  Prognosis: TBD. Awaiting swallow eval but feel that if her current trend of recurrent PNA in setting of debility, bed bound for approx 10 years with new sacral ulcer, that she would qualify for hospice in home care.   Discharge Planning:  Probably home. Options of home health vs. Hospice support. Will continue to address       Chief Complaint/History of Present Illness: Pt is a 81yo female admitted with hypoxia and a 2-day h/o dyspnea. Her WBC is 29, and CXR shows RLL infiltrate. She was placed on BIPAP and started on neo for hypotension.  Primary Diagnoses  Present on Admission:  . HCAP (healthcare-associated pneumonia)  Palliative Review of Systems: Mild pain to sacrum and legs worse with movement; off BIPAP, denies dyspnea, depressed I have reviewed the medical record, interviewed the patient and family, and examined the patient. The following aspects are pertinent.  Past Medical History  Diagnosis Date  . Stroke   . COPD (chronic obstructive pulmonary disease)   . Coronary artery disease   . Hypertension   . Diabetes mellitus type 1    Social History   Social History  . Marital Status: Divorced **Note De-Identified Jorene Kaylor Obfuscation** Spouse Name: N/A  . Number of Children: N/A  . Years of Education: N/A   Social History Main Topics  . Smoking status: Former Research scientist (life sciences)  . Smokeless tobacco: Never Used  . Alcohol Use: No  . Drug Use: No  . Sexual Activity: Not Asked   Other Topics Concern  . None   Social History Narrative   Family History  Problem Relation Age of Onset  . CAD     Scheduled Meds: . antiseptic oral rinse  7 mL Mouth Rinse q12n4p  . aspirin  81 mg Oral  Daily  . cefTAZidime (FORTAZ)  IV  1 g Intravenous Q24H  . chlorhexidine  15 mL Mouth Rinse BID  . clopidogrel  75 mg Oral Daily  . heparin  5,000 Units Subcutaneous 3 times per day  . insulin aspart  0-9 Units Subcutaneous 6 times per day  . ipratropium-albuterol  3 mL Nebulization Q6H  . [START ON 02/05/2015] vancomycin  1,000 mg Intravenous Q48H   Continuous Infusions: . sodium chloride 100 mL/hr at 02/04/15 1000  . phenylephrine (NEO-SYNEPHRINE) Adult infusion Stopped (02/04/15 1000)   PRN Meds:.sodium chloride, albuterol Medications Prior to Admission:  Prior to Admission medications   Medication Sig Start Date End Date Taking? Authorizing Provider  albuterol (PROVENTIL HFA;VENTOLIN HFA) 108 (90 BASE) MCG/ACT inhaler Inhale 2 puffs into the lungs every 6 (six) hours as needed for wheezing or shortness of breath. 2 puffs 3 times daily x 3 days then use as needed. 05/12/13  Yes Eugenie Filler, MD  ALPRAZolam Duanne Moron) 0.25 MG tablet Take 1 tablet (0.25 mg total) by mouth 2 (two) times daily as needed for anxiety. Patient taking differently: Take 0.125-0.25 mg by mouth 2 (two) times daily as needed for anxiety.  05/12/13  Yes Eugenie Filler, MD  aspirin EC 81 MG tablet Take 81 mg by mouth daily.   Yes Historical Provider, MD  clopidogrel (PLAVIX) 75 MG tablet Take 75 mg by mouth daily. 10/15/14  Yes Historical Provider, MD  furosemide (LASIX) 40 MG tablet Take 1 tablet (40 mg total) by mouth daily. 05/12/13  Yes Eugenie Filler, MD  insulin detemir (LEVEMIR) 100 UNIT/ML injection Inject 0.05 mLs (5 Units total) into the skin at bedtime. 12/28/14  Yes Vipul Manuella Ghazi, MD  ipratropium-albuterol (DUONEB) 0.5-2.5 (3) MG/3ML SOLN Take 3 mLs by nebulization 2 (two) times daily. Patient taking differently: Take 3 mLs by nebulization daily.  05/12/13  Yes Eugenie Filler, MD  lisinopril (PRINIVIL,ZESTRIL) 2.5 MG tablet Take 1 tablet (2.5 mg total) by mouth daily. 05/12/13  Yes Eugenie Filler, MD   metoprolol (LOPRESSOR) 50 MG tablet Take 1 tablet (50 mg total) by mouth 2 (two) times daily. Patient taking differently: Take 50 mg by mouth daily.  05/12/13  Yes Eugenie Filler, MD  potassium chloride SA (K-DUR,KLOR-CON) 20 MEQ tablet Take 1 tablet (20 mEq total) by mouth 2 (two) times daily. 12/28/14  Yes Max Sane, MD  rosuvastatin (CRESTOR) 10 MG tablet Take 10 mg by mouth daily.   Yes Historical Provider, MD  amoxicillin-clavulanate (AUGMENTIN) 875-125 MG per tablet Take 1 tablet by mouth 2 (two) times daily. Patient not taking: Reported on 02/03/2015 12/28/14   Max Sane, MD  atorvastatin (LIPITOR) 40 MG tablet Take 1 tablet (40 mg total) by mouth daily at 6 PM. Patient not taking: Reported on 12/22/2014 05/12/13   Eugenie Filler, MD  feeding supplement, GLUCERNA SHAKE, (GLUCERNA SHAKE) LIQD Take 237 mLs by mouth 2 ( **Note De-Identified Sahalie Beth Obfuscation** two) times daily between meals. 05/12/13   Eugenie Filler, MD  isosorbide-hydrALAZINE (BIDIL) 20-37.5 MG per tablet Take 1 tablet by mouth 3 (three) times daily. Patient not taking: Reported on 10/30/2014 05/12/13   Eugenie Filler, MD   No Known Allergies CBC:    Component Value Date/Time   WBC 29.0* 02/04/2015 0049   WBC 23.3* 06/14/2014 0515   HGB 10.9* 02/04/2015 0049   HGB 9.7* 06/14/2014 0515   HCT 34.5* 02/04/2015 0049   HCT 30.5* 06/14/2014 0515   PLT 648* 02/04/2015 0049   PLT 412 06/14/2014 0515   MCV 86.5 02/04/2015 0049   MCV 89 06/14/2014 0515   NEUTROABS 20.4* 02/03/2015 1847   NEUTROABS 19.3* 06/14/2014 0515   LYMPHSABS 3.5 02/03/2015 1847   LYMPHSABS 2.6 06/14/2014 0515   MONOABS 1.3* 02/03/2015 1847   MONOABS 1.0* 06/14/2014 0515   EOSABS 0.0 02/03/2015 1847   EOSABS 0.3 06/14/2014 0515   BASOSABS 0.0 02/03/2015 1847   BASOSABS 0.0 06/14/2014 0515   Comprehensive Metabolic Panel:    Component Value Date/Time   NA 137 02/04/2015 0049   NA 141 06/13/2014 0440   K 4.5 02/04/2015 0049   K 4.1 06/13/2014 0440   CL 101 02/04/2015 0049   CL  107 06/13/2014 0440   CO2 15* 02/04/2015 0049   CO2 23 06/13/2014 0440   BUN 69* 02/04/2015 0049   BUN 42* 06/13/2014 0440   CREATININE 2.95* 02/04/2015 0049   CREATININE 0.98 06/13/2014 0440   GLUCOSE 254* 02/04/2015 0049   GLUCOSE 159* 06/13/2014 0440   CALCIUM 8.4* 02/04/2015 0049   CALCIUM 8.2* 06/13/2014 0440   AST 20 12/21/2014 2258   AST 16 06/11/2014 2133   ALT 9* 12/21/2014 2258   ALT 10* 06/11/2014 2133   ALKPHOS 60 12/21/2014 2258   ALKPHOS 79 06/11/2014 2133   BILITOT 1.1 12/21/2014 2258   BILITOT 0.8 06/11/2014 2133   PROT 7.4 12/21/2014 2258   PROT 8.3* 06/11/2014 2133   ALBUMIN 2.9* 12/21/2014 2258   ALBUMIN 3.1* 06/11/2014 2133    Physical Exam: Vital Signs: BP 123/54 mmHg  Pulse 87  Temp(Src) 97.7 F (36.5 C) (Oral)  Resp 16  Ht _0  (1.575 m)  Wt 61.8 kg (136 lb 3.9 oz)  BMI 24.91 kg/m2  SpO2 97% SpO2: SpO2: 97 % O2 Device: O2 Device: Not Delivered O2 Flow Rate:   Intake/output summary:  Intake/Output Summary (Last 24 hours) at 02/04/15 1214 Last data filed at 02/04/15 1000  Gross per 24 hour  Intake 2610.92 ml  Output    250 ml  Net 2360.92 ml   LBM:   Baseline Weight: Weight: 63.504 kg (140 lb) Most recent weight: Weight: 61.8 kg (136 lb 3.9 oz)  Exam Findings:  General: Frail elderly female who looks acutely ill Resp: No work of breathing. Off BIPAP, on Brown Cardiac: Tachy Musculoskeletal: Knees contracted, right hand contracted         Palliative Performance Scale: 30%              Additional Data Reviewed: Recent Labs     02/03/15  1847  02/03/15  1854  02/04/15  0049  WBC  25.2*   --   29.0*  HGB  12.0  13.9  10.9*  PLT  670*   --   648*  NA  133*  132*  137  BUN  80*  72*  69*  CREATININE  3.39*  3.30*  2.95* **Note De-Identified Cordia Miklos Obfuscation** Time In: 0900 Time Out: 1010 Time Total: 70 min Greater than 50%  of this time was spent counseling and coordinating care related to the above assessment and plan. Have left message with Dtr. Will continue to  try to reach by phone or in person family for [place] discussion  Signed by: Dory Horn, NP  Dory Horn, NP  02/04/2015, 12:14 PM  Please contact Palliative Medicine Team phone at [phone] for questions and concerns.    Addendum: Met daughter Brenda Hogan, Brenda Hogan, at bedside starting at 1630, time out 1715. Total additional time 45 min. GOC reviewed. Plan as follows: 1. While in the hospital use BIPAP if pt clinically declines 2. Use pressors if pt clinically declines 3. No CPR, defibrillation 4. Home with hospice when medically ready. Family wishes to continue antibiotics, treat what can be treated while she is here 5. Discussed at length asp pna and risks vs changing benefit ratio with antibiotics going forward 6. Detailed hospice in home services education

## 2015-02-05 ENCOUNTER — Encounter (HOSPITAL_COMMUNITY): Payer: Medicare Other

## 2015-02-05 ENCOUNTER — Inpatient Hospital Stay (HOSPITAL_COMMUNITY): Payer: Medicare Other

## 2015-02-05 DIAGNOSIS — E876 Hypokalemia: Secondary | ICD-10-CM

## 2015-02-05 DIAGNOSIS — N170 Acute kidney failure with tubular necrosis: Secondary | ICD-10-CM

## 2015-02-05 DIAGNOSIS — L8990 Pressure ulcer of unspecified site, unspecified stage: Secondary | ICD-10-CM | POA: Insufficient documentation

## 2015-02-05 DIAGNOSIS — M7989 Other specified soft tissue disorders: Secondary | ICD-10-CM

## 2015-02-05 LAB — CBC
HCT: 28.6 % — ABNORMAL LOW (ref 36.0–46.0)
Hemoglobin: 9 g/dL — ABNORMAL LOW (ref 12.0–15.0)
MCH: 26.7 pg (ref 26.0–34.0)
MCHC: 31.5 g/dL (ref 30.0–36.0)
MCV: 84.9 fL (ref 78.0–100.0)
PLATELETS: 538 10*3/uL — AB (ref 150–400)
RBC: 3.37 MIL/uL — AB (ref 3.87–5.11)
RDW: 16.4 % — AB (ref 11.5–15.5)
WBC: 22.4 10*3/uL — ABNORMAL HIGH (ref 4.0–10.5)

## 2015-02-05 LAB — MAGNESIUM: Magnesium: 2.5 mg/dL — ABNORMAL HIGH (ref 1.7–2.4)

## 2015-02-05 LAB — GLUCOSE, CAPILLARY
GLUCOSE-CAPILLARY: 96 mg/dL (ref 65–99)
GLUCOSE-CAPILLARY: 136 mg/dL — AB (ref 65–99)
Glucose-Capillary: 109 mg/dL — ABNORMAL HIGH (ref 65–99)
Glucose-Capillary: 86 mg/dL (ref 65–99)
Glucose-Capillary: 95 mg/dL (ref 65–99)

## 2015-02-05 LAB — BASIC METABOLIC PANEL
Anion gap: 14 (ref 5–15)
BUN: 63 mg/dL — AB (ref 6–20)
CALCIUM: 8.2 mg/dL — AB (ref 8.9–10.3)
CHLORIDE: 106 mmol/L (ref 101–111)
CO2: 19 mmol/L — AB (ref 22–32)
CREATININE: 2.41 mg/dL — AB (ref 0.44–1.00)
GFR calc Af Amer: 21 mL/min — ABNORMAL LOW (ref >60)
GFR calc non Af Amer: 18 mL/min — ABNORMAL LOW (ref >60)
GLUCOSE: 102 mg/dL — AB (ref 65–99)
Potassium: 3.1 mmol/L — ABNORMAL LOW (ref 3.5–5.1)
Sodium: 139 mmol/L (ref 135–145)

## 2015-02-05 LAB — LACTIC ACID, PLASMA: Lactic Acid, Venous: 1.1 mmol/L (ref 0.5–2.0)

## 2015-02-05 LAB — STREP PNEUMONIAE URINARY ANTIGEN: STREP PNEUMO URINARY ANTIGEN: NEGATIVE

## 2015-02-05 MED ORDER — ACETAMINOPHEN 325 MG PO TABS
650.0000 mg | ORAL_TABLET | Freq: Four times a day (QID) | ORAL | Status: DC | PRN
  Administered 2015-02-05: 650 mg via ORAL
  Filled 2015-02-05: qty 2

## 2015-02-05 MED ORDER — ALPRAZOLAM 0.25 MG PO TABS
0.2500 mg | ORAL_TABLET | Freq: Two times a day (BID) | ORAL | Status: DC | PRN
  Administered 2015-02-05 – 2015-02-06 (×2): 0.25 mg via ORAL
  Filled 2015-02-05 (×2): qty 1

## 2015-02-05 MED ORDER — ALBUTEROL SULFATE (2.5 MG/3ML) 0.083% IN NEBU: 2.5000 mg | INHALATION_SOLUTION | RESPIRATORY_TRACT | Status: DC | PRN

## 2015-02-05 MED ORDER — OXYCODONE HCL 5 MG PO TABS
5.0000 mg | ORAL_TABLET | ORAL | Status: DC | PRN
  Administered 2015-02-05 – 2015-02-06 (×2): 5 mg via ORAL
  Filled 2015-02-05 (×2): qty 1

## 2015-02-05 MED ORDER — ASPIRIN EC 81 MG PO TBEC: 81.0000 mg | DELAYED_RELEASE_TABLET | Freq: Every day | ORAL | Status: DC

## 2015-02-05 MED ORDER — METOPROLOL TARTRATE 50 MG PO TABS
50.0000 mg | ORAL_TABLET | Freq: Two times a day (BID) | ORAL | Status: DC
  Administered 2015-02-05 – 2015-02-09 (×8): 50 mg via ORAL
  Filled 2015-02-05 (×8): qty 1

## 2015-02-05 MED ORDER — RESOURCE THICKENUP CLEAR PO POWD
ORAL | Status: DC | PRN
  Filled 2015-02-05: qty 125

## 2015-02-05 MED ORDER — MORPHINE SULFATE (PF) 2 MG/ML IV SOLN: 1.0000 mg | INTRAVENOUS | Status: DC | PRN

## 2015-02-05 MED ORDER — IPRATROPIUM-ALBUTEROL 0.5-2.5 (3) MG/3ML IN SOLN
3.0000 mL | Freq: Four times a day (QID) | RESPIRATORY_TRACT | Status: DC
  Administered 2015-02-05 – 2015-02-07 (×6): 3 mL via RESPIRATORY_TRACT
  Filled 2015-02-05 (×6): qty 3

## 2015-02-05 MED ORDER — POTASSIUM CHLORIDE CRYS ER 20 MEQ PO TBCR
40.0000 meq | EXTENDED_RELEASE_TABLET | Freq: Once | ORAL | Status: AC
  Administered 2015-02-05: 40 meq via ORAL
  Filled 2015-02-05: qty 2

## 2015-02-05 MED ORDER — INSULIN ASPART 100 UNIT/ML ~~LOC~~ SOLN
0.0000 [IU] | Freq: Three times a day (TID) | SUBCUTANEOUS | Status: DC
  Administered 2015-02-06: 1 [IU] via SUBCUTANEOUS
  Administered 2015-02-07 (×2): 2 [IU] via SUBCUTANEOUS
  Administered 2015-02-08 – 2015-02-09 (×2): 1 [IU] via SUBCUTANEOUS

## 2015-02-05 MED ORDER — ALPRAZOLAM 0.25 MG PO TABS
0.1250 mg | ORAL_TABLET | Freq: Two times a day (BID) | ORAL | Status: DC | PRN
  Filled 2015-02-05: qty 1

## 2015-02-05 MED ORDER — SODIUM CHLORIDE 0.9 % IV SOLN: 250.0000 mL | INTRAVENOUS | Status: DC | PRN

## 2015-02-05 NOTE — Care Management Note (Addendum)
Case Management Note  Patient Details  Name: Brenda Hogan MRN: [id number] Date of Birth: [birth year]  Subjective/Objective:    Respiratory failure,possible  sepsis, sacral decubitus, with generalized pain from bed bound status of 10 years  Action/Plan: Hospice and Palliative consult done yesterday, met with patient and family to discuss goals of care, they were all agreeable with Home Hospice care. CM attempted to contact daughter Amyla Heffner [phone]) to discuss further discharge planning, offer list of [facility]. LVM for a return call. CM will follow up tomorrow 02/06/15.  Expected Discharge Date:   02/06/15               Expected Discharge Plan:  Home w Hospice Care  In-House Referral:  Clinical Social Work  Discharge planning Services  CM Consult  Post Acute Care Choice:    Choice offered to:     DME Arranged:    DME Agency:     HH Arranged:    HH Agency:     Status of Service:  In process, will continue to follow  Medicare Important Message Given:    Date Medicare IM Given:    Medicare IM give by:    Date Additional Medicare IM Given:    Additional Medicare Important Message give by:     If discussed at [place] of Stay Meetings, dates discussed:    Additional CommentsLaurena Slimmer, RN 02/05/2015, 3:54 PM

## 2015-02-05 NOTE — Progress Notes (Addendum)
Daily Progress Note   Patient Name: Brenda Hogan       Date: 02/05/2015 DOB: 18-Jun-1934  Age: 80 y.o. MRN#: [id number] Attending Physician: Cherene Altes, MD Primary Care Physician: Alvester Chou, NP Admit Date: 02/03/2015  Reason for Consultation/Follow-up: Non pain symptom management, Pain control and Psychosocial/spiritual support  Subjective: . Brother at the bedside. She is alert. States her legs hurt when you attempt to move them or straighten them. Eating bites and sips. Appetite poor. No further need for BIPAP. No PRN's noted Interval Events: Transferred to the floor. Vascular study ordered fpr LE Length of Stay: 2 days  Current Medications: Scheduled Meds:  . aspirin  81 mg Oral Daily  . cefTAZidime (FORTAZ)  IV  1 g Intravenous Q24H  . clopidogrel  75 mg Oral Daily  . collagenase   Topical Daily  . heparin  5,000 Units Subcutaneous 3 times per day  . [START ON 02/06/2015] insulin aspart  0-9 Units Subcutaneous TID WC  . ipratropium-albuterol  3 mL Nebulization QID  . metoprolol  50 mg Oral BID  . vancomycin  1,000 mg Intravenous Q48H    Continuous Infusions:    PRN Meds: sodium chloride, acetaminophen, albuterol, ALPRAZolam, morphine injection, oxyCODONE, RESOURCE THICKENUP CLEAR  Palliative Performance Scale: 30%     Vital Signs: BP 112/86 mmHg  Pulse 91  Temp(Src) 98.2 F (36.8 C) (Oral)  Resp 19  Ht 5\' 2"  (1.575 m)  Wt 61.689 kg (136 lb)  BMI 24.87 kg/m2  SpO2 98% SpO2: SpO2: 98 % O2 Device: O2 Device: Nasal Cannula O2 Flow Rate: O2 Flow Rate (L/min): 2 L/min  Intake/output summary:  Intake/Output Summary (Last 24 hours) at 02/05/15 1853 Last data filed at 02/05/15 1430  Gross per 24 hour  Intake    290 ml  Output    100 ml  Net    190 ml   LBM:   Baseline Weight: Weight: 63.504 kg (140 lb) Most recent weight: Weight: 61.689 kg (136 lb)  Physical Exam: General: Elderly female, alert. No acute distress Resp: No work of  breathing Neuro: LE contracted, right hand contracted              Additional Data Reviewed: Recent Labs     02/04/15  0049  02/05/15  0634  WBC  29.0*  22.4*  HGB  10.9*  9.0*  PLT  648*  538*  NA  137  139  BUN  69*  63*  CREATININE  2.95*  2.41*     Problem List:  Patient Active Problem List   Diagnosis Date Noted  . Pressure ulcer 02/05/2015  . Palliative care encounter   . Acute on chronic respiratory failure with hypoxia   . Acute on chronic renal failure   . Arterial hypotension   . Failure to thrive in adult   . Muscular deconditioning   . Acute respiratory insufficiency 12/22/2014  . HCAP (healthcare-associated pneumonia) 12/22/2014  . Acute combined systolic and diastolic CHF, NYHA class 1 05/11/2013  . HTN (hypertension) 05/11/2013  . Diabetes mellitus, type II 05/11/2013  . Diabetes 05/10/2013  . NSTEMI (non-ST elevated myocardial infarction) 05/04/2013  . Sepsis 05/01/2013     Palliative Care Assessment & Plan    Code Status:  Partial  Goals of Care:  Home with hospice once medical treatment completed  Reapply BIPAP if pt were to get into resp trouble while in the hospital. Will be DNR in community  Symptom Management:  Pain:Cont  low dose iv ms04 or oxycodone po. DC home on oxycodone.This will help dyspnea as well  Dyspnea: Improving. Cont 02, opioids available for severe SHOB refractory to other measures. Cont ms04 iv in hospital setting, po oxycodone available  Palliative Prophylaxis:  Bowel regimin  Psycho-social/Spiritual:  Desire for further Chaplaincy support:no   Prognosis: < 6 months Discharge Planning: Home with Hospice    Thank you for allowing the Palliative Medicine Team to assist in the care of this patient. Placed call to daughter, Brenda Hogan to update her on mother's condition   Time In: 1730 Time Out: 1810 Total Time 40 min Prolonged Time Billed  no     Greater than 50%  of this time was spent counseling and  coordinating care related to the above assessment and plan.   Dory Horn, NP  02/05/2015, 6:53 PM  Please contact Palliative Medicine Team phone at [phone] for questions and concerns.

## 2015-02-05 NOTE — Progress Notes (Signed)
Speech Language Pathology Treatment: Dysphagia  Patient Details Name: Brenda Hogan MRN: [id number] DOB: 1934-05-12 Today's Date: 02/05/2015 Time: 0093-8182 SLP Time Calculation (min) (ACUTE ONLY): 11 min  Assessment / Plan / Recommendation Clinical Impression  Dysphagia treatment provided to check for tolerance of D1/ honey thick liquid diet. Pt refused puree foods but tried several sips of honey thick liquids by teaspoon. Pt showed no immediate s/s of aspiration but did have an intermittent congested, weak cough intermittently throughout treatment. Reviewed strategies with pt (small bites/ sips, sit upright), and encouraged pt to continue coughing if feeling the sensation to do so. Recommend continuing dysphagia 1/ honey thick liquids with full supervision, also ensure pt is seated upright as close to 90 degrees as pt will tolerate- pt was hesitant about sitting upright today. Will continue to follow for diet tolerance.   HPI Other Pertinent Information: 80 y.o. F brought [facility] ED 9/29 with HCAP. Placed on BiPAP.  She is severely debilitated as she has been bed bound for past 10 years. MD reports Portable CXR 9/30 shows Questionable right basilar opacity. Otherwise hyperinflation. Possible aspiration pna.    Pertinent Vitals Pain Assessment: No/denies pain  SLP Plan  Continue with current plan of care    Recommendations Diet recommendations: Dysphagia 1 (puree);Honey-thick liquid Liquids provided via: Teaspoon;Cup Medication Administration: Crushed with puree Supervision: Full supervision/cueing for compensatory strategies Compensations: Slow rate;Small sips/bites Postural Changes and/or Swallow Maneuvers: Seated upright 90 degrees              Oral Care Recommendations: Oral care BID Follow up Recommendations: Other (comment) (TBD) Plan: Continue with current plan of care    Shell, Amy K, [place], CCC-SLP 02/05/2015, 12:36 PM  [phone]

## 2015-02-05 NOTE — Progress Notes (Signed)
HR 140's nonsustained. MD notified. Will continue to monitor.

## 2015-02-05 NOTE — Progress Notes (Signed)
Brenda Hogan TEAM 1 - Stepdown/ICU TEAM PROGRESS NOTE  Brenda Hogan PZW:[id number] DOB: April 30, 1935 DOA: 02/03/2015 PCP: Alvester Chou, NP  Admit HPI / Brief Narrative: 80 y.o. F brought to [facility] ED 9/29 with HCAP. Placed on BiPAP and PCCM called for admission. She is severely debilitated as she has been bed bound for past 10 years.   Significant Events: 9/29 - admit 9/30 - weaned off BiPAP & Neo gtt 9/30 - Venous Duplex   HPI/Subjective: Pt is lethargic but able to be awakened.  She simply tells me she wants to get out of the hospital asap.  She denies cp, n/v, ha, or abdom pain.  She denies dyspnea.    Assessment/Plan:  Acute on chronic hypoxic respiratory failure due to RLL HCAP v/s Aspiration Pneumonia Cleared for D1 diet w/ honey thick liquids per SLP - cont empiric abx coverage - WBC improved and pt currently afebrile   Shock - Sepsis versus dehydration Hemodynamically stable at the present time - lactate has normalized   1/2 blood cx gram+ cocci clusters Await speciation and sensitivities - possible contaminant   Acute on Chronic Renal Failure Improving - due to septic shock - baseline crt 1.0 as of Aug 2016  Hyponatremia Resolved  Hypokalemia Replace and follow  Lactic Acidosis Resolved   Hyperphosphatemia F/u in AM  Sacral decub ulcers - present at admit  See WOC RN notes   DM CBG well controlled  H/O CAD Resume BB - follow   H/O HTN  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 rate 19, height 5\' 2"  (1.575 m), weight 61.689 kg (136 lb), SpO2 98 %.  Intake/Output Summary (Last 24 hours)  at 02/05/15 1610 Last data filed at 02/05/15 1430  Gross per 24 hour  Intake    390 ml  Output    100 ml  Net    290 ml    Exam: General: No acute respiratory distress - alert but lethargic  Lungs: bibasilar crackles - no wheeze  Cardiovascular: Regular rate and rhythm without murmur gallop or rub  Abdomen: Nontender, nondistended, soft, bowel sounds positive, no rebound, no ascites, no appreciable mass Extremities: No significant cyanosis, clubbing, or edema bilateral lower extremities - heel sparing boots intact B  Data Reviewed: Basic Metabolic Panel:  Recent Labs Lab 02/03/15 1847 02/03/15 1854 02/04/15 0049 02/05/15 0634  NA 133* 132* 137 139  K 4.5 4.2 4.5 3.1*  CL 93* 97* 101 106  CO2 20*  --  15* 19*  GLUCOSE 193* 194* 254* 102*  BUN 80* 72* 69* 63*  CREATININE 3.39* 3.30* 2.95* 2.41*  CALCIUM 8.7*  --  8.4* 8.2*  MG  --   --  2.9* 2.5*  PHOS  --   --  6.5*  --     CBC:  Recent Labs Lab 02/03/15 1847 02/03/15 1854 02/04/15 0049 02/05/15 0634  WBC 25.2*  --  29.0* 22.4*  NEUTROABS 20.4*  --   --   --   HGB 12.0 13.9 10.9* 9.0*  HCT 38.1 41.0 34.5* 28.6*  MCV 86.6  --  86.5 84.9  PLT 670*  --  648* 538*    Liver Function Tests: No results for input(s): AST, ALT, ALKPHOS, BILITOT, PROT, ALBUMIN in the last 168 hours. No results for input(s): LIPASE, AMYLASE in the last 168 hours. No results for input(s): AMMONIA in the last 168 hours.  CBG:  Recent Labs Lab 02/04/15 1528 02/04/15 1939 02/04/15 2359 02/05/15 0803 02/05/15 1210  GLUCAP 127* 136* 95 96 86    Recent Results (from the past 240 hour(s))  Culture, blood (routine x 2)     Status: None (Preliminary result)   Collection Time: 02/03/15  7:25 PM  Result Value Ref Range Status   Specimen Description BLOOD RIGHT ARM  Final   Special Requests BOTTLES DRAWN AEROBIC ONLY 3CC  Final   Culture  Setup Time   Final    GRAM POSITIVE COCCI IN CLUSTERS AEROBIC BOTTLE ONLY CRITICAL RESULT  CALLED TO, READ BACK BY AND VERIFIED WITH: Jerl Mina RN 1635 02/04/15 A BROWNING    Culture GRAM POSITIVE COCCI  Final   Report Status PENDING  Incomplete  Culture, blood (routine x 2)     Status: None (Preliminary result)   Collection Time: 02/03/15  7:32 PM  Result Value Ref Range Status   Specimen Description BLOOD RIGHT HAND  Final   Special Requests BOTTLES DRAWN AEROBIC AND ANAEROBIC 5CC  Final   Culture NO GROWTH 2 DAYS  Final   Report Status PENDING  Incomplete  MRSA PCR Screening     Status: None   Collection Time: 02/03/15 11:34 PM  Result Value Ref Range Status   MRSA by PCR NEGATIVE NEGATIVE Final    Comment:        The GeneXpert MRSA Assay (FDA approved for NASAL specimens only), is one component of a comprehensive MRSA colonization surveillance program. It is not intended to diagnose MRSA infection nor to guide or monitor treatment for MRSA infections.      Studies:   Recent x-ray studies have been reviewed in detail by the Attending Physician  Scheduled Meds:  Scheduled Meds: . aspirin  81 mg Oral Daily  . cefTAZidime (FORTAZ)  IV  1 g Intravenous Q24H  . clopidogrel  75 mg Oral Daily  . collagenase   Topical Daily  . heparin  5,000 Units Subcutaneous 3 times per day  . insulin aspart  0-9 Units Subcutaneous 6 times per day  . ipratropium-albuterol  3 mL Nebulization Q6H  . vancomycin  1,000 mg Intravenous Q48H    Time spent on care of this patient: 35 mins   Vontae Court T , MD   [facility] Office  [phone] Pager - Text Page per Shea Evans as per below:  On-Call/Text Page:      [email or web address]      password [email or web address]  If 7PM-7AM, please contact night-coverage [email or web address] Password [email or web address] 02/05/2015, 4:10 PM   LOS: 2 days

## 2015-02-06 DIAGNOSIS — I5041 Acute combined systolic (congestive) and diastolic (congestive) heart failure: Secondary | ICD-10-CM

## 2015-02-06 DIAGNOSIS — L8990 Pressure ulcer of unspecified site, unspecified stage: Secondary | ICD-10-CM

## 2015-02-06 DIAGNOSIS — J9600 Acute respiratory failure, unspecified whether with hypoxia or hypercapnia: Secondary | ICD-10-CM | POA: Insufficient documentation

## 2015-02-06 LAB — COMPREHENSIVE METABOLIC PANEL
ALT: 9 U/L — AB (ref 14–54)
AST: 14 U/L — ABNORMAL LOW (ref 15–41)
Albumin: 1.7 g/dL — ABNORMAL LOW (ref 3.5–5.0)
Alkaline Phosphatase: 75 U/L (ref 38–126)
Anion gap: 14 (ref 5–15)
BILIRUBIN TOTAL: 0.9 mg/dL (ref 0.3–1.2)
BUN: 56 mg/dL — AB (ref 6–20)
CHLORIDE: 107 mmol/L (ref 101–111)
CO2: 20 mmol/L — ABNORMAL LOW (ref 22–32)
CREATININE: 1.87 mg/dL — AB (ref 0.44–1.00)
Calcium: 8.4 mg/dL — ABNORMAL LOW (ref 8.9–10.3)
GFR, EST AFRICAN AMERICAN: 29 mL/min — AB (ref >60)
GFR, EST NON AFRICAN AMERICAN: 25 mL/min — AB (ref >60)
Glucose, Bld: 123 mg/dL — ABNORMAL HIGH (ref 65–99)
POTASSIUM: 3.9 mmol/L (ref 3.5–5.1)
Sodium: 141 mmol/L (ref 135–145)
TOTAL PROTEIN: 6 g/dL — AB (ref 6.5–8.1)

## 2015-02-06 LAB — CULTURE, BLOOD (ROUTINE X 2)

## 2015-02-06 LAB — GLUCOSE, CAPILLARY
GLUCOSE-CAPILLARY: 104 mg/dL — AB (ref 65–99)
GLUCOSE-CAPILLARY: 128 mg/dL — AB (ref 65–99)
GLUCOSE-CAPILLARY: 116 mg/dL — AB (ref 65–99)
Glucose-Capillary: 126 mg/dL — ABNORMAL HIGH (ref 65–99)

## 2015-02-06 LAB — CBC
HEMATOCRIT: 29.7 % — AB (ref 36.0–46.0)
Hemoglobin: 9.5 g/dL — ABNORMAL LOW (ref 12.0–15.0)
MCH: 27.2 pg (ref 26.0–34.0)
MCHC: 32 g/dL (ref 30.0–36.0)
MCV: 85.1 fL (ref 78.0–100.0)
Platelets: 543 10*3/uL — ABNORMAL HIGH (ref 150–400)
RBC: 3.49 MIL/uL — ABNORMAL LOW (ref 3.87–5.11)
RDW: 16.8 % — AB (ref 11.5–15.5)
WBC: 17.4 10*3/uL — AB (ref 4.0–10.5)

## 2015-02-06 LAB — PHOSPHORUS: PHOSPHORUS: 3 mg/dL (ref 2.5–4.6)

## 2015-02-06 MED ORDER — DEXTROSE-NACL 5-0.45 % IV SOLN
INTRAVENOUS | Status: DC
  Administered 2015-02-06 – 2015-02-08 (×3): via INTRAVENOUS

## 2015-02-06 MED ORDER — LORAZEPAM 0.5 MG PO TABS
0.5000 mg | ORAL_TABLET | Freq: Two times a day (BID) | ORAL | Status: DC | PRN
  Administered 2015-02-06 – 2015-02-08 (×4): 0.5 mg via ORAL
  Filled 2015-02-06 (×4): qty 1

## 2015-02-06 MED ORDER — OXYCODONE HCL 5 MG PO TABS
5.0000 mg | ORAL_TABLET | Freq: Four times a day (QID) | ORAL | Status: DC | PRN
  Administered 2015-02-07: 5 mg via ORAL
  Filled 2015-02-06: qty 1

## 2015-02-06 MED ORDER — VANCOMYCIN HCL IN DEXTROSE 750-5 MG/150ML-% IV SOLN
750.0000 mg | INTRAVENOUS | Status: DC
  Administered 2015-02-06: 750 mg via INTRAVENOUS
  Filled 2015-02-06 (×2): qty 150

## 2015-02-06 NOTE — Care Management (Signed)
CM received call from daughter Ilham Roughton [phone]  concerning patient and families goal of care  for home hospice, daughter confirmed and is agreeable.   Patient lives at home with daughter primary caregiver.  Offered choice of Home Hospice Agencies. [facility] [place] select [phone]. Spoke with Mariann Laster on call RN concerning referral. Referral faxed to [facility] [phone], received fax confirmation.  CM will follow up tomorrow with  disposition plan.

## 2015-02-06 NOTE — Consult Note (Signed)
WOC reviewed records.  Palliative consult completed. Plans for home with hospice care.  Will need low air loss mattress at home for offloading of her pressure ulcers and comfort for her pain in these areas.  Will make transfers a bit easier as well for family and lessen likelihood of further trauma to her skin.  Will continue Santyl for now and reassess wounds this week prior to DC if possible.   [place] team will follow along with you for weekly wound assessments.  Please notify me of any acute changes in the wounds or any new areas of concerns Para March RN,CWOCN [phone]

## 2015-02-06 NOTE — Progress Notes (Signed)
Daily Progress Note   Patient Name: Brenda Hogan       Date: 02/06/2015 DOB: 08-Dec-1934  Age: 80 y.o. MRN#: [id number] Attending Physician: Mendel Corning, MD Primary Care Physician: Alvester Chou, NP Admit Date: 02/03/2015  Reason for Consultation/Follow-up: Establishing goals of care, Non pain symptom management, Pain control and Psychosocial/spiritual support  Subjective: Pt a little sleepy when I visited her. She said she felt ok, that I woke her up. I do see that she got a xanax at about 0500 and oxycodone 5 mg in am . She denied pain or dyspnea Interval Events: Vascular study performed on LE. Negative for DVT Length of Stay: 3 days  Current Medications: Scheduled Meds:  . aspirin  81 mg Oral Daily  . cefTAZidime (FORTAZ)  IV  1 g Intravenous Q24H  . clopidogrel  75 mg Oral Daily  . collagenase   Topical Daily  . heparin  5,000 Units Subcutaneous 3 times per day  . insulin aspart  0-9 Units Subcutaneous TID WC  . ipratropium-albuterol  3 mL Nebulization QID  . metoprolol  50 mg Oral BID  . vancomycin  750 mg Intravenous Q24H    Continuous Infusions: . dextrose 5 % and 0.45% NaCl 50 mL/hr at 02/06/15 0925    PRN Meds: acetaminophen, albuterol, LORazepam, morphine injection, oxyCODONE, RESOURCE THICKENUP CLEAR  Palliative Performance Scale: 30%     Vital Signs: BP 100/48 mmHg  Pulse 72  Temp(Src) 98.2 F (36.8 C) (Oral)  Resp 20  Ht 5\' 2"  (1.575 m)  Wt 61.689 kg (136 lb)  BMI 24.87 kg/m2  SpO2 94% SpO2: SpO2: 94 % O2 Device: O2 Device: Nasal Cannula O2 Flow Rate: O2 Flow Rate (L/min): 2 L/min  Intake/output summary:  Intake/Output Summary (Last 24 hours) at 02/06/15 1634 Last data filed at 02/06/15 1400  Gross per 24 hour  Intake 779.17 ml  Output    750 ml  Net  29.17 ml   LBM:   Baseline Weight: Weight: 63.504 kg (140 lb) Most recent weight: Weight: 61.689 kg (136 lb)  Physical Exam: General: Sleepy but awakens easily. No acute  distress Resp: No work of breathing Psych: A/O x 3. Knows she came to hospital for pna, who her visitors have been. Verbalizing readiness for dc              Additional Data Reviewed: Recent Labs     02/05/15  0634  02/06/15  0415  WBC  22.4*  17.4*  HGB  9.0*  9.5*  PLT  538*  543*  NA  139  141  BUN  63*  56*  CREATININE  2.41*  1.87*     Problem List:  Patient Active Problem List   Diagnosis Date Noted  . Acute respiratory failure ([place])   . Pressure ulcer 02/05/2015  . Palliative care encounter   . Acute on chronic respiratory failure with hypoxia ([place])   . Acute on chronic renal failure ([place])   . Arterial hypotension   . Failure to thrive in adult   . Muscular deconditioning   . Acute respiratory insufficiency 12/22/2014  . HCAP (healthcare-associated pneumonia) 12/22/2014  . Acute combined systolic and diastolic CHF, NYHA class 1 ([place]) 05/11/2013  . HTN (hypertension) 05/11/2013  . Diabetes mellitus, type II ([place]) 05/11/2013  . Diabetes ([place]) 05/10/2013  . NSTEMI (non-ST elevated myocardial infarction) ([place]) 05/04/2013  . Sepsis ([place]) 05/01/2013     Palliative Care Assessment & Plan    Code  Status:  Partial while in the hospital: pressors and bipap  Goals of Care:  Home with hospice when medically ready  Symptom Management:  Pain: Pt was started on oxycodone for LE as well as sacral wound pain. She denied pain to day. Cont oxycodone 5mg  prn as well as tylneol prn for mild pain   Palliative Prophylaxis:  Bowel regimen  Psycho-social/Spiritual:  Desire for further Chaplaincy support:no   Prognosis: < 6 months Discharge Planning: Home with Hospice   Care plan was discussed with Dr. Tana Coast, specifically code status. Pt is actually as partial code wanting BiPAP and pressors in the hospital if she were to decline but does not want intubation, CPR, or defibrillation. Amended noted on 02/05/15 and updated Dr. Tana Coast. LM with dtr Candie Mile to provide an  update  Thank you for allowing the Palliative Medicine Team to assist in the care of this patient.   Time In: 1600 Time Out: 1630 Total Time 30 min Prolonged Time Billed  no     Greater than 50%  of this time was spent counseling and coordinating care related to the above assessment and plan.   Dory Horn, NP  02/06/2015, 4:34 PM  Please contact Palliative Medicine Team phone at [phone] for questions and concerns.

## 2015-02-06 NOTE — Progress Notes (Signed)
[facility]                                                                              Patient Demographics  Brenda Hogan, is a 80 y.o. female, DOB - 1934-08-16, IRC:[id number]  Admit date - 02/03/2015   Admitting Physician Rigoberto Noel, MD  Outpatient Primary MD for the patient is Alvester Chou, NP  LOS - 3   Chief Complaint  Patient presents with  . Respiratory Distress       Brief HPI   The patient is a 80 year old female with history of CAD, diabetes mellitus, hypertension and COPD presented to ED on 9/29 with shortness of breath that had been ongoing for the past 2 days prior to admission. Otherwise denied any productive cough or fevers. EMS was called and patient was found to have wheezing along with diffuse rails, and respiratory distress. She was treated en route with Solu-Medrol magnesium, albuterol and placed on CPAP. O2 sats were 88%. In ED, patient was placed on BiPAP with improvement in the sats. She was also found to be in acute kidney injury, creatinine of 3.3, baseline 1.1 with lactic acidosis. Chest x-ray was concerning for right-sided pneumonia. Code sepsis was called, due to hypotension and septic shock not responding to IV fluids, patient was started on vasopressors. Patient was admitted by critical care service to the ICU.  Patient was also found to have pressure sores, had been on O2 3 L continuous. Previously she was admitted to [facility] in 12/2014 for aspiration. She is severely debilitated as she has been bed bound for past 10 years.   Palliative medicine consult was obtained on 02/04/15 for goals of care. Patient was evaluated by speech therapy on 9/30, recommended dysphagia 1 puree diet,  meds crushed with puree. The patient has been followed closely by wound care as well during hospitalization. She was transferred to the stepdown unit and to the [facility] on 02/05/15.  Assessment & Plan    Principal  problem Acute on chronic hypoxic respiratory failure due to RLL HCAP v/s Aspiration Pneumonia - Now significantly improved, off BiPAP, O2 sats 97% on 2 L  - Patient was evaluated by speech therapy, recommended dysphagia 1 diet, honey thick liquids, crushed pills in applesauce with full supervision. - Continue IV vancomycin and Fortaz, overall improving, no fevers or chills, leukocytosis improving   Active problems Shock - Sepsis versus dehydration - Hemodynamically stable at the present time, lactate has normalized. Off vasopressors  1/2 blood cx gram+ cocci clusters - Await speciation and sensitivities - possible contaminant   Acute on Chronic Renal Failure -Patient presented with creatinine of 3.39 at the time of admission likely due to sepsis and #1, baseline creatinine 1.0 as of 8/16 - Improving, creatinine 1.8 today   Hyponatremia  sodium 141 change IV fluids to D5 half normal  Hypokalemia Resolved  Lactic acidosis likely due to sepsis and shock   patient presented with lactic acid of 4.63, Resolved  with IV fluid hydration   Hyperphosphatemia Resolved  Sacral decub ulcers - present at admit  See [place] RN notes, DME is a  mattress ordered    DM - CBG well controlled - Continue sliding scale insulin   H/O CAD Resume BB - follow   H/O HTN - Currently BP stable   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 aspirin  81 mg Oral Daily  . cefTAZidime (FORTAZ)  IV  1 g Intravenous Q24H  . clopidogrel  75 mg Oral Daily  . collagenase   Topical Daily  .  heparin  5,000 Units Subcutaneous 3 times per day  . insulin aspart  0-9 Units Subcutaneous TID WC  . ipratropium-albuterol  3 mL Nebulization QID  . metoprolol  50 mg Oral BID  . vancomycin  1,000 mg Intravenous Q48H   Continuous Infusions:  PRN Meds:.sodium chloride, acetaminophen, albuterol, ALPRAZolam, morphine injection, oxyCODONE, RESOURCE THICKENUP CLEAR   Antibiotics   Anti-infectives    Start     Dose/Rate Route Frequency Ordered Stop   02/05/15 1930  vancomycin (VANCOCIN) IVPB 1000 mg/200 mL premix     1,000 mg 200 mL/hr over 60 Minutes Intravenous Every 48 hours 02/03/15 1917     02/03/15 1930  vancomycin (VANCOCIN) 1,500 mg in sodium chloride 0.9 % 500 mL IVPB     1,500 mg 250 mL/hr over 120 Minutes Intravenous  Once 02/03/15 1915 02/03/15 2145   02/03/15 1915  cefTAZidime (FORTAZ) 1 g in dextrose 5 % 50 mL IVPB     1 g 100 mL/hr over 30 Minutes Intravenous Every 24 hours 02/03/15 1915          Subjective:   Brenda Hogan was seen and examined today.  Bradycardia weak and debilitated, otherwise denies any specific complaints. No fevers or chills. No coughing. No acute events overnight.   no pain, nausea or vomiting, abdominal pain O2 sats 97% on 3 L   Objective:   Blood pressure 157/92, pulse 83, temperature 98.1 F (36.7 C), temperature source Oral, resp. rate 18, height 5\' 2"  (1.575 m), weight 61.689 kg (136 lb), SpO2 97 %.  Wt Readings from Last 3 Encounters:  02/05/15 61.689 kg (136 lb)  12/26/14 65.9 kg (145 lb 4.5 oz)  11/03/14 74.163 kg (163 lb 8 oz)     Intake/Output Summary (Last 24 hours) at 02/06/15 0908 Last data filed at 02/06/15 0644  Gross per 24 hour  Intake    490 ml  Output    550 ml  Net    -60 ml    Exam  General:  Awake and oriented to self and place, NAD  HEENT:  PERRLA, EOMI, Anicteric Sclera, mucous membranes moist.   Neck: Supple, no JVD, no masses  CVS: S1 S2 auscultated, no rubs, murmurs or gallops. Regular rate and  rhythm.  Respiratory: Decreased breath sounds at the bases  Abdomen: Soft, nontender, nondistended, + bowel sounds  Ext: no cyanosis clubbing. Bilateral lower extremities and heel protective boots   Neuro: upper extremities 5/5, did not cooperate with the lower extremity exam   Skin: No rashes  Psych:  normal affect, oriented to self and place    Data Review   Micro  Results Recent Results (from the past 240 hour(s))  Culture, blood (routine x 2)     Status: None (Preliminary result)   Collection Time: 02/03/15  7:25 PM  Result Value Ref Range Status   Specimen Description BLOOD RIGHT ARM  Final   Special Requests BOTTLES DRAWN AEROBIC ONLY 3CC  Final   Culture  Setup Time   Final    GRAM POSITIVE COCCI IN CLUSTERS AEROBIC BOTTLE ONLY CRITICAL RESULT CALLED TO, READ BACK BY AND VERIFIED WITH: Jerl Mina RN 1635 02/04/15 A BROWNING    Culture GRAM POSITIVE COCCI  Final   Report Status PENDING  Incomplete  Culture, blood (routine x 2)     Status: None (Preliminary result)   Collection Time: 02/03/15  7:32 PM  Result Value Ref Range Status   Specimen Description BLOOD RIGHT HAND  Final   Special Requests BOTTLES DRAWN AEROBIC AND ANAEROBIC 5CC  Final   Culture NO GROWTH 2 DAYS  Final   Report Status PENDING  Incomplete  MRSA PCR Screening     Status: None   Collection Time: 02/03/15 11:34 PM  Result Value Ref Range Status   MRSA by PCR NEGATIVE NEGATIVE Final    Comment:        The GeneXpert MRSA Assay (FDA approved for NASAL specimens only), is one component of a comprehensive MRSA colonization surveillance program. It is not intended to diagnose MRSA infection nor to guide or monitor treatment for MRSA infections.   Wound culture     Status: None (Preliminary result)   Collection Time: 02/05/15 11:00 AM  Result Value Ref Range Status   Specimen Description WOUND SACRAL  Final   Special Requests NONE  Final   Gram Stain PENDING  Incomplete   Culture   Final     Culture reincubated for better growth Performed at [facility]    Report Status PENDING  Incomplete    Radiology Reports Dg Chest Port 1 View  02/05/2015   CLINICAL DATA:  Acute respiratory failure.  EXAM: PORTABLE CHEST 1 VIEW  COMPARISON:  02/04/2015 Lung  FINDINGS: Retrocardiac density on the right is unchanged and may represent atelectasis or pneumonia. Small right effusion has progressed. Left lung remains clear. Negative for heart failure  IMPRESSION: Right lower lobe airspace disease is unchanged. This may represent atelectasis or effusion. Small right effusion has increased in the interval.   Electronically Signed   By: Franchot Gallo M.D.   On: 02/05/2015 09:15   Dg Chest Port 1 View  02/04/2015   CLINICAL DATA:  Respiratory failure, history of COPD and pneumonia.  EXAM: PORTABLE CHEST 1 VIEW  COMPARISON:  Portable chest x-ray of February 03, 2015  FINDINGS: The patient is rotated on today's study. The lungs are adequately inflated. Increased density at the right lung base persists. This is in part due to the dome of the diaphragm. The heart and pulmonary vascularity are normal. The bony thorax is unremarkable.  IMPRESSION: Allowing for differences in positioning there is no significant interval change in the appearance of the chest. There is persistent right lower lobe atelectasis or pneumonia. When the patient can tolerate the procedure, a PA and lateral chest x-ray would be useful.   Electronically Signed   By: David  Martinique M.D.   On: 02/04/2015 07:22   Dg Chest Port 1 View  02/03/2015   CLINICAL DATA:  Respiratory distress  EXAM: PORTABLE CHEST - 1 VIEW  COMPARISON:  12/25/2014  FINDINGS: Cardiac shadow is  stable. Patient is rotated to the right accentuating the mediastinal markings. The lungs are well aerated bilaterally. Some slight increased density is noted in the right lung base which may represent some early infiltrate. No other focal abnormality is noted.  IMPRESSION:  Slight increased density in the right lung base. This may represent some early infiltrate or residual from previously noted infiltrate.   Electronically Signed   By: Inez Catalina M.D.   On: 02/03/2015 18:37   Dg Swallowing Func-speech Pathology  02/04/2015    Objective Swallowing Evaluation:    Patient Details  Name: Geri Hepler MRN: [id number] Date of Birth: 1935/03/07  Today's Date: 02/04/2015 Time: SLP Start Time (ACUTE ONLY): 1300-SLP Stop Time (ACUTE ONLY): 1330 SLP Time Calculation (min) (ACUTE ONLY): 30 min  Past Medical History:  Past Medical History  Diagnosis Date  . Stroke   . COPD (chronic obstructive pulmonary disease)   . Coronary artery disease   . Hypertension   . Diabetes mellitus type 1    Past Surgical History:  Past Surgical History  Procedure Laterality Date  . Total knee arthroplasty Bilateral    HPI:  Other Pertinent Information: 80 y.o. F brought to [facility] ED 9/29 with HCAP.  Placed on BiPAP.  She is severely debilitated as she has been bed bound  for past 10 years. MD reports Portable CXR 9/30 shows Questionable right  basilar opacity. Otherwise hyperinflation. Possible aspiration pna.   No Data Recorded  Assessment / Plan / Recommendation CHL IP CLINICAL IMPRESSIONS 02/04/2015  Therapy Diagnosis Mild-moderate pharyngeal phase dysphagia;Mild oral phase  dysphagia  Clinical Impression Pt presents with a mild-moderate oropharyngeal based  dysphagia.  Orally pt presented with decreased posterior bolus cohesion  which resulted in premature spillage of boluses into the pharynx.  Swallow  initiation was most consistently triggered at the pyriforms with thin  liquids and at the vallecula with nectars.  Delayed swallow initiation  resulted in penetration of thin liquids and nectar thick liquids which did  not consistently clear during the swallow.  Cues for throat clear and  volitional cough were not consistently effective for clearing penetrates  from the airway.  Use of chin tuck was  effective for improving airway  protection during the swallow; however, pt required cues for timing of  chin tuck.  When timing of positioning was not adequate pt was noted with  recurrence of penetration.  Questionable trace aspiration versus  penetration was noted on 1 trial of purees.  No evidence of aspiration or  penetration was evident with honey thick liquids.  Recommend a  conservative diet of dys 1 textures, honey thick liquids with full  supervision for use of swallowing precautions.  Prognosis for advancement  good with trials of advanced consistencies during ST to reinforce use of  swallowing precautions.        CHL IP TREATMENT RECOMMENDATION 02/04/2015  Treatment Recommendations F/U MBS in --- days (Comment)     CHL IP DIET RECOMMENDATION 02/04/2015  SLP Diet Recommendations Dysphagia 1 (Puree);Honey  Liquid Administration via (None)  Medication Administration Crushed with puree  Compensations (None)  Postural Changes and/or Swallow Maneuvers (None)     CHL IP OTHER RECOMMENDATIONS 02/04/2015  Recommended Consults (None)  Oral Care Recommendations Oral care BID  Other Recommendations Order thickener from pharmacy     CHL IP FOLLOW UP RECOMMENDATIONS 12/24/2014  Follow up Recommendations Skilled Nursing facility     [facility] IP FREQUENCY AND DURATION 02/04/2015  Speech Therapy Frequency (ACUTE ONLY)  min 2x/week  Treatment Duration 2 weeks         SLP Swallow Goals No flowsheet data found.  No flowsheet data found.    CHL IP REASON FOR REFERRAL 02/04/2015  Reason for Referral Objectively evaluate swallowing function     CHL IP ORAL PHASE 02/04/2015  Lips (None)  Tongue (None)  Mucous membranes (None)  Nutritional status (None)  Other (None)  Oxygen therapy (None)  Oral Phase Impaired  Oral - Pudding Teaspoon (None)  Oral - Pudding Cup (None)  Oral - Honey Teaspoon (None)  Oral - Honey Cup (None)  Oral - Honey Syringe (None)  Oral - Nectar Teaspoon (None)  Oral - Nectar Cup (None)  Oral - Nectar Straw (None)  Oral -  Nectar Syringe (None)  Oral - Ice Chips (None)  Oral - Thin Teaspoon (None)  Oral - Thin Cup (None)  Oral - Thin Straw (None)  Oral - Thin Syringe (None)  Oral - Puree (None)  Oral - Mechanical Soft (None)  Oral - Regular (None)  Oral - Multi-consistency (None)  Oral - Pill (None)  Oral Phase - Comment (None)      CHL IP PHARYNGEAL PHASE 02/04/2015  Pharyngeal Phase Impaired  Pharyngeal - Pudding Teaspoon (None)  Penetration/Aspiration details (pudding teaspoon) (None)  Pharyngeal - Pudding Cup (None)  Penetration/Aspiration details (pudding cup) (None)  Pharyngeal - Honey Teaspoon (None)  Penetration/Aspiration details (honey teaspoon) (None)  Pharyngeal - Honey Cup (None)  Penetration/Aspiration details (honey cup) (None)  Pharyngeal - Honey Syringe (None)  Penetration/Aspiration details (honey syringe) (None)  Pharyngeal - Nectar Teaspoon (None)  Penetration/Aspiration details (nectar teaspoon) (None)  Pharyngeal - Nectar Cup (None)  Penetration/Aspiration details (nectar cup) (None)  Pharyngeal - Nectar Straw (None)  Penetration/Aspiration details (nectar straw) (None)  Pharyngeal - Nectar Syringe (None)  Penetration/Aspiration details (nectar syringe) (None)  Pharyngeal - Ice Chips (None)  Penetration/Aspiration details (ice chips) (None)  Pharyngeal - Thin Teaspoon (None)  Penetration/Aspiration details (thin teaspoon) (None)  Pharyngeal - Thin Cup (None)  Penetration/Aspiration details (thin cup) (None)  Pharyngeal - Thin Straw (None)  Penetration/Aspiration details (thin straw) (None)  Pharyngeal - Thin Syringe (None)  Penetration/Aspiration details (thin syringe') (None)  Pharyngeal - Puree (None)  Penetration/Aspiration details (puree) (None)  Pharyngeal - Mechanical Soft (None)  Penetration/Aspiration details (mechanical soft) (None)  Pharyngeal - Regular (None)  Penetration/Aspiration details (regular) (None)  Pharyngeal - Multi-consistency (None)  Penetration/Aspiration details (multi-consistency) (None)   Pharyngeal - Pill (None)  Penetration/Aspiration details (pill) (None)  Pharyngeal Comment (None)             Page, Selinda Orion 02/04/2015, 2:24 PM     CBC  Recent Labs Lab 02/03/15 1847 02/03/15 1854 02/04/15 0049 02/05/15 0634 02/06/15 0415  WBC 25.2*  --  29.0* 22.4* 17.4*  HGB 12.0 13.9 10.9* 9.0* 9.5*  HCT 38.1 41.0 34.5* 28.6* 29.7*  PLT 670*  --  648* 538* 543*  MCV 86.6  --  86.5 84.9 85.1  MCH 27.3  --  27.3 26.7 27.2  MCHC 31.5  --  31.6 31.5 32.0  RDW 16.4*  --  16.3* 16.4* 16.8*  LYMPHSABS 3.5  --   --   --   --   MONOABS 1.3*  --   --   --   --   EOSABS 0.0  --   --   --   --   BASOSABS 0.0  --   --   --   --  Chemistries   Recent Labs Lab 02/03/15 1847 02/03/15 1854 02/04/15 0049 02/05/15 0634 02/06/15 0415  NA 133* 132* 137 139 141  K 4.5 4.2 4.5 3.1* 3.9  CL 93* 97* 101 106 107  CO2 20*  --  15* 19* 20*  GLUCOSE 193* 194* 254* 102* 123*  BUN 80* 72* 69* 63* 56*  CREATININE 3.39* 3.30* 2.95* 2.41* 1.87*  CALCIUM 8.7*  --  8.4* 8.2* 8.4*  MG  --   --  2.9* 2.5*  --   AST  --   --   --   --  14*  ALT  --   --   --   --  9*  ALKPHOS  --   --   --   --  75  BILITOT  --   --   --   --  0.9   ------------------------------------------------------------------------------------------------------------------ estimated creatinine clearance is 21.8 mL/min (by C-G formula based on Cr of 1.87). ------------------------------------------------------------------------------------------------------------------ No results for input(s): HGBA1C in the last 72 hours. ------------------------------------------------------------------------------------------------------------------ No results for input(s): CHOL, HDL, LDLCALC, TRIG, CHOLHDL, LDLDIRECT in the last 72 hours. ------------------------------------------------------------------------------------------------------------------ No results for input(s): TSH, T4TOTAL, T3FREE, THYROIDAB in the last 72  hours.  Invalid input(s): FREET3 ------------------------------------------------------------------------------------------------------------------ No results for input(s): VITAMINB12, FOLATE, FERRITIN, TIBC, IRON, RETICCTPCT in the last 72 hours.  Coagulation profile No results for input(s): INR, PROTIME in the last 168 hours.  No results for input(s): DDIMER in the last 72 hours.  Cardiac Enzymes No results for input(s): CKMB, TROPONINI, MYOGLOBIN in the last 168 hours.  Invalid input(s): CK ------------------------------------------------------------------------------------------------------------------ Invalid input(s): [place]  02/04/15 2359 02/05/15 0803 02/05/15 1210 02/05/15 1646 02/05/15 2033 02/06/15 0741  GLUCAP 95 96 86 109* 136* 104*     RAI,RIPUDEEP M.D. [facility] 02/06/2015, 9:08 AM  Pager: [phone] Between 7am to 7pm - call Pager - [phone]  After 7pm go to [email or web address] - password [email or web address]  Call night coverage person covering after 7pm

## 2015-02-06 NOTE — Progress Notes (Signed)
ANTIBIOTIC CONSULT NOTE - FOLLOW UP  Pharmacy Consult for Vancomycin and Ceftaz Indication: rule out pneumonia and rule out sepsis  No Known Allergies  Patient Measurements: Height: 5\' 2"  (157.5 cm) Weight: 136 lb (61.689 kg) IBW/kg (Calculated) : 50.1  Vital Signs: Temp: 98.5 F (36.9 C) (10/02 0845) Temp Source: Oral (10/02 0845) BP: 116/55 mmHg (10/02 0845) Pulse Rate: 68 (10/02 0845) Intake/Output from previous day: 10/01 0701 - 10/02 0700 In: 83 [P.O.:240; IV Piggyback:250] Out: 550 [Urine:550] Intake/Output from this shift: Total I/O In: 469.2 [P.O.:240; I.V.:229.2] Out: 300 [Urine:300]  Labs:  Recent Labs  02/04/15 0049 02/05/15 0634 02/06/15 0415  WBC 29.0* 22.4* 17.4*  HGB 10.9* 9.0* 9.5*  PLT 648* 538* 543*  CREATININE 2.95* 2.41* 1.87*   Estimated Creatinine Clearance: 21.8 mL/min (by C-G formula based on Cr of 1.87). No results for input(s): VANCOTROUGH, VANCOPEAK, VANCORANDOM, GENTTROUGH, GENTPEAK, GENTRANDOM, TOBRATROUGH, TOBRAPEAK, TOBRARND, AMIKACINPEAK, AMIKACINTROU, AMIKACIN in the last 72 hours.   Microbiology: Recent Results (from the past 720 hour(s))  Culture, blood (routine x 2)     Status: None   Collection Time: 02/03/15  7:25 PM  Result Value Ref Range Status   Specimen Description BLOOD RIGHT ARM  Final   Special Requests BOTTLES DRAWN AEROBIC ONLY 3CC  Final   Culture  Setup Time   Final    GRAM POSITIVE COCCI IN CLUSTERS AEROBIC BOTTLE ONLY CRITICAL RESULT CALLED TO, READ BACK BY AND VERIFIED WITH: Jerl Mina RN 1635 02/04/15 A BROWNING    Culture   Final    STAPHYLOCOCCUS SPECIES (COAGULASE NEGATIVE) THE SIGNIFICANCE OF ISOLATING THIS ORGANISM FROM A SINGLE SET OF BLOOD CULTURES WHEN MULTIPLE SETS ARE DRAWN IS UNCERTAIN. PLEASE NOTIFY THE MICROBIOLOGY DEPARTMENT WITHIN ONE WEEK IF SPECIATION AND SENSITIVITIES ARE REQUIRED.    Report Status 02/06/2015 FINAL  Final  Culture, blood (routine x 2)     Status: None (Preliminary result)    Collection Time: 02/03/15  7:32 PM  Result Value Ref Range Status   Specimen Description BLOOD RIGHT HAND  Final   Special Requests BOTTLES DRAWN AEROBIC AND ANAEROBIC 5CC  Final   Culture NO GROWTH 2 DAYS  Final   Report Status PENDING  Incomplete  MRSA PCR Screening     Status: None   Collection Time: 02/03/15 11:34 PM  Result Value Ref Range Status   MRSA by PCR NEGATIVE NEGATIVE Final    Comment:        The GeneXpert MRSA Assay (FDA approved for NASAL specimens only), is one component of a comprehensive MRSA colonization surveillance program. It is not intended to diagnose MRSA infection nor to guide or monitor treatment for MRSA infections.   Wound culture     Status: None (Preliminary result)   Collection Time: 02/05/15 11:00 AM  Result Value Ref Range Status   Specimen Description WOUND SACRAL  Final   Special Requests NONE  Final   Gram Stain PENDING  Incomplete   Culture   Final    Culture reincubated for better growth Performed at [facility]    Report Status PENDING  Incomplete    Anti-infectives    Start     Dose/Rate Route Frequency Ordered Stop   02/05/15 1930  vancomycin (VANCOCIN) IVPB 1000 mg/200 mL premix     1,000 mg 200 mL/hr over 60 Minutes Intravenous Every 48 hours 02/03/15 1917     02/03/15 1930  vancomycin (VANCOCIN) 1,500 mg in sodium chloride 0.9 % 500 mL IVPB  1,500 mg 250 mL/hr over 120 Minutes Intravenous  Once 02/03/15 1915 02/03/15 2145   02/03/15 1915  cefTAZidime (FORTAZ) 1 g in dextrose 5 % 50 mL IVPB     1 g 100 mL/hr over 30 Minutes Intravenous Every 24 hours 02/03/15 1915        Assessment: 80 yo Fon empiric antibiotics for presumed HCAP/sepsis.  WBC elevated on admission and continues to trend down.  Pt is afebrile.  Renal function is also improving with SCr 1.87.  CrCl ~ 20.  Will adjust antibiotic doses accordingly.  Noted blood cx 1/2 coag negative staph - likely contaminant Would cx pending  Goal of  Therapy:  Vancomycin trough level 15-20 mcg/ml  Plan:  Change Vancomycin to 750 mg IV q24h -- consider d/c? Continue Ceftaz 1gm IV q24h Monitor cx data, renal function, and clinical progress   [employer], Pharm.D., BCPS Clinical Pharmacist Pager [phone] 02/06/2015 3:00 PM

## 2015-02-06 NOTE — Progress Notes (Signed)
VASCULAR LAB PRELIMINARY  PRELIMINARY  PRELIMINARY  PRELIMINARY  Bilateral lower extremity venous duplex completed.    Preliminary report:  There is no obvious evidence of DVT or SVT noted in the visualized veins of the bilateral lower extremities.   Marlen Koman, RVT 02/06/2015, 8:05 AM

## 2015-02-06 NOTE — Evaluation (Signed)
Physical Therapy Evaluation Patient Details Name: Brenda Hogan MRN: [id number] DOB: 05-23-1934 Today's Date: 02/06/2015   History of Present Illness  Patient is a 80 yo female admitted 02/03/15 with HCAP.  PMH:  bedbound x many years, CVA, COPD on 3 liters O2, HOH, decreased vision, CAD, DM, HTN, mult sacral wounds  Clinical Impression  Patient presents with problems listed below.  Patient has been bedbound x 10 years per chart.  Patient with increased pain with mobility.  Unable to assist with bed mobility.  Patient is at her baseline functional level.  Patient to receive Hospice Services at home at d/c.  No acute PT needs identified - PT will sign off.    Follow Up Recommendations No PT follow up;Supervision/Assistance - 24 hour (Patient to d/c home with Hospice services)    Equipment Recommendations  Hospital bed    Recommendations for Other Services       Precautions / Restrictions Precautions Precautions: Fall Precaution Comments: Has bil Prevalon boots in place Restrictions Weight Bearing Restrictions: No      Mobility  Bed Mobility Overal bed mobility: Needs Assistance Bed Mobility: Rolling Rolling: Total assist         General bed mobility comments: Verbal and tactile cues for mobility.  Patient requiring total assist to roll in bed.  Mobility is painful to patient - LE's and buttocks.  Transfers                 General transfer comment: BLE flexion contractures make sitting in chair difficult .  Ambulation/Gait             General Gait Details: Unable - has not been ambulatory since prior to TKA per chart.  Stairs            Wheelchair Mobility    Modified Rankin (Stroke Patients Only)       Balance                                             Pertinent Vitals/Pain Pain Assessment: No/denies pain    Home Living Family/patient expects to be discharged to:: Private residence Living Arrangements:  Children;Other relatives (2 daughters and grandson) Available Help at Discharge: Family Type of Home: House       Home Layout: One level Home Equipment: Hospital bed Additional Comments: Information from patient and chart - ? reliability    Prior Function Level of Independence: Needs assistance   Gait / Transfers Assistance Needed: Patient is bed bound at home.  ADL's / Homemaking Assistance Needed: Total assist for all ADL's        Hand Dominance   Dominant Hand: Right    Extremity/Trunk Assessment   Upper Extremity Assessment: RUE deficits/detail;LUE deficits/detail RUE Deficits / Details: Strength grossly 1/5 - 2-/5. Rt hand contracted     LUE Deficits / Details: Strength grossly 2/5   Lower Extremity Assessment: RLE deficits/detail;LLE deficits/detail RLE Deficits / Details: Strength 1/5; RLE contracted into flexion at hip and knee LLE Deficits / Details: Strength 2-/5; RLE contracted into flexion at hip and knee     Communication   Communication: No difficulties  Cognition Arousal/Alertness: Awake/alert Behavior During Therapy: Flat affect Overall Cognitive Status: No family/caregiver present to determine baseline cognitive functioning  General Comments General comments (skin integrity, edema, etc.): Mult sacral wounds    Exercises        Assessment/Plan    PT Assessment Patent does not need any further PT services  PT Diagnosis Generalized weakness;Acute pain   PT Problem List    PT Treatment Interventions     PT Goals (Current goals can be found in the Care Plan section) Acute Rehab PT Goals PT Goal Formulation: All assessment and education complete, DC therapy    Frequency     Barriers to discharge        Co-evaluation               End of Session Equipment Utilized During Treatment: Oxygen Activity Tolerance: Patient limited by pain Patient left: in bed;with call bell/phone within reach;with bed alarm  set Nurse Communication: Mobility status         Time: 9211-9417 PT Time Calculation (min) (ACUTE ONLY): 9 min   Charges:   PT Evaluation $Initial PT Evaluation Tier I: 1 Procedure     PT G CodesDespina Pole 02-15-15, 6:42 PM Carita Pian. Sanjuana Kava, [place] Pager [phone]

## 2015-02-07 LAB — CBC
HEMATOCRIT: 32.9 % — AB (ref 36.0–46.0)
HEMOGLOBIN: 10.1 g/dL — AB (ref 12.0–15.0)
MCH: 26.2 pg (ref 26.0–34.0)
MCHC: 30.7 g/dL (ref 30.0–36.0)
MCV: 85.5 fL (ref 78.0–100.0)
Platelets: 545 10*3/uL — ABNORMAL HIGH (ref 150–400)
RBC: 3.85 MIL/uL — ABNORMAL LOW (ref 3.87–5.11)
RDW: 17 % — AB (ref 11.5–15.5)
WBC: 15 10*3/uL — ABNORMAL HIGH (ref 4.0–10.5)

## 2015-02-07 LAB — BASIC METABOLIC PANEL
Anion gap: 12 (ref 5–15)
BUN: 44 mg/dL — AB (ref 6–20)
CHLORIDE: 106 mmol/L (ref 101–111)
CO2: 21 mmol/L — AB (ref 22–32)
Calcium: 8.2 mg/dL — ABNORMAL LOW (ref 8.9–10.3)
Creatinine, Ser: 1.48 mg/dL — ABNORMAL HIGH (ref 0.44–1.00)
GFR calc Af Amer: 38 mL/min — ABNORMAL LOW (ref >60)
GFR calc non Af Amer: 33 mL/min — ABNORMAL LOW (ref >60)
GLUCOSE: 146 mg/dL — AB (ref 65–99)
POTASSIUM: 3.4 mmol/L — AB (ref 3.5–5.1)
Sodium: 139 mmol/L (ref 135–145)

## 2015-02-07 LAB — LEGIONELLA PNEUMOPHILA SEROGP 1 UR AG
L. PNEUMOPHILA SEROGP 1 UR AG: NEGATIVE
L. PNEUMOPHILA SEROGP 1 UR AG: NEGATIVE

## 2015-02-07 LAB — GLUCOSE, CAPILLARY
GLUCOSE-CAPILLARY: 117 mg/dL — AB (ref 65–99)
Glucose-Capillary: 159 mg/dL — ABNORMAL HIGH (ref 65–99)
Glucose-Capillary: 151 mg/dL — ABNORMAL HIGH (ref 65–99)
Glucose-Capillary: 126 mg/dL — ABNORMAL HIGH (ref 65–99)

## 2015-02-07 MED ORDER — LEVOFLOXACIN 750 MG PO TABS
750.0000 mg | ORAL_TABLET | ORAL | Status: AC
  Administered 2015-02-07: 750 mg via ORAL
  Filled 2015-02-07: qty 1

## 2015-02-07 MED ORDER — POTASSIUM CHLORIDE 20 MEQ PO PACK
20.0000 meq | PACK | Freq: Once | ORAL | Status: AC
  Administered 2015-02-07: 20 meq via ORAL
  Filled 2015-02-07: qty 1

## 2015-02-07 MED ORDER — IPRATROPIUM-ALBUTEROL 0.5-2.5 (3) MG/3ML IN SOLN
3.0000 mL | Freq: Three times a day (TID) | RESPIRATORY_TRACT | Status: DC
  Administered 2015-02-07 – 2015-02-08 (×3): 3 mL via RESPIRATORY_TRACT
  Filled 2015-02-07 (×4): qty 3

## 2015-02-07 MED ORDER — RISAQUAD PO CAPS
1.0000 | ORAL_CAPSULE | Freq: Every day | ORAL | Status: DC
  Administered 2015-02-07 – 2015-02-09 (×3): 1 via ORAL
  Filled 2015-02-07 (×3): qty 1

## 2015-02-07 MED ORDER — CIPROFLOXACIN HCL 500 MG PO TABS: 500.0000 mg | ORAL_TABLET | Freq: Every evening | ORAL | Status: DC

## 2015-02-07 MED ORDER — LEVOFLOXACIN 500 MG PO TABS: 500.0000 mg | ORAL_TABLET | ORAL | Status: DC

## 2015-02-07 NOTE — Progress Notes (Signed)
Speech Language Pathology Treatment: Dysphagia  Patient Details Name: Brenda Hogan MRN: [id number] DOB: 06/05/1934 Today's Date: 02/07/2015 Time: 7846-9629 SLP Time Calculation (min) (ACUTE ONLY): 14 min  Assessment / Plan / Recommendation Clinical Impression  ST follow up for therapeutic diet tolerance.  Nursing reported that the patient's intake has been very poor.  Further chart review indicated that the patient has been afebrile and her lungs were clear except diminished until this AM.  Meal observation was completed.  The patient's positioning is less the optimal.  She c/o pain with any movement of the bed, however was able to get her upright.  No overt s/s of aspiration were noted given dysphagia 1 and honey thick liquids.  The patient reported that she is not eating well b/c she is not hungry.  Discussed with the patient attempting to increase intake to improve her ability to heal.  ST will continue to follow.     HPI Other Pertinent Information: 80 y.o. F brought to [facility] ED 9/29 with HCAP. Placed on BiPAP.  She is severely debilitated as she has been bed bound for past 10 years. MD reports Portable CXR 9/30 shows Questionable right basilar opacity. Otherwise hyperinflation. Possible aspiration pna.    Pertinent Vitals Pain Assessment: No/denies pain  SLP Plan  Continue with current plan of care    Recommendations Diet recommendations: Dysphagia 1 (puree);Honey-thick liquid Liquids provided via: Cup Medication Administration: Crushed with puree Supervision: Full supervision/cueing for compensatory strategies Compensations: Slow rate;Small sips/bites Postural Changes and/or Swallow Maneuvers: Seated upright 90 degrees              Plan: Continue with current plan of care    Wood, MA, CCC-SLP Acute Rehab SLP [phone]  Lamar Sprinkles 02/07/2015, 9:16 AM

## 2015-02-07 NOTE — Progress Notes (Signed)
Occupational Therapy Discharge Patient Details Name: Brenda Hogan MRN: [id number] DOB: 1934-08-20 Today's Date: 02/07/2015 Time:  -     Patient discharged from OT services secondary to patient near baseline per PT Manuela Schwartz notes. Pt bed bound x 10 years..  Please see latest therapy progress note for current level of functioning and progress toward goals.    Progress and discharge plan discussed with patient and/or caregiver: Patient unable to participate in discharge planning and no caregivers available  GO     [name]   OTR/L Pager: [phone] Office: [phone] .  02/07/2015, 1:25 PM

## 2015-02-07 NOTE — Care Management Important Message (Signed)
Important Message  Patient Details  Name: Brenda Hogan MRN: [id number] Date of Birth: 01-28-1935   Medicare Important Message Given:  Yes-second notification given    [name] 02/07/2015, 12:40 PM

## 2015-02-07 NOTE — Progress Notes (Signed)
[facility]                                                                              Patient Demographics  Brenda Hogan, is a 80 y.o. female, DOB - 28-Jun-1934, XTK:[id number]  Admit date - 02/03/2015   Admitting Physician Rigoberto Noel, MD  Outpatient Primary MD for the patient is Alvester Chou, NP  LOS - 4   Chief Complaint  Patient presents with  . Respiratory Distress       Brief HPI   The patient is a 80 year old female with history of CAD, diabetes mellitus, hypertension and COPD presented to ED on 9/29 with shortness of breath that had been ongoing for the past 2 days prior to admission. Otherwise denied any productive cough or fevers. EMS was called and patient was found to have wheezing along with diffuse rails, and respiratory distress. She was treated en route with Solu-Medrol magnesium, albuterol and placed on CPAP. O2 sats were 88%. In ED, patient was placed on BiPAP with improvement in the sats. She was also found to be in acute kidney injury, creatinine of 3.3, baseline 1.1 with lactic acidosis. Chest x-ray was concerning for right-sided pneumonia. Code sepsis was called, due to hypotension and septic shock not responding to IV fluids, patient was started on vasopressors. Patient was admitted by critical care service to the ICU.  Patient was also found to have pressure sores, had been on O2 3 L continuous. Previously she was admitted to [facility] in 12/2014 for aspiration. She is severely debilitated as she has been bed bound for past 10 years.   Palliative medicine consult was obtained on 02/04/15 for goals of care. Patient was evaluated by speech therapy on 9/30, recommended dysphagia 1 puree diet,  meds crushed with puree. The patient has been followed closely by wound care as well during hospitalization. She was transferred to the stepdown unit and to [facility] on 02/05/15.  Assessment & Plan    Principal  problem Acute on chronic hypoxic respiratory failure due to RLL HCAP v/s Aspiration Pneumonia - Now significantly improved, off BiPAP, O2 sats 98% on 2 L  - Patient was evaluated by speech therapy, recommended dysphagia 1 diet, honey thick liquids, crushed pills in applesauce with full supervision. - Continue IV vancomycin and Fortaz, overall improving, no fevers or chills, leukocytosis improving. Narrow antibiotics to Levaquin.   Active problems Shock - Sepsis versus dehydration - Hemodynamically stable at the present time, lactate has normalized. Off vasopressors  1/2 blood cx gram+ cocci clusters - Blood cultures shows 1/2 coagulase-negative staph. DC vancomycin  Sacral Pressure ulcers - DME low-air-loss mattress and hospital bed requested -Wound culture showing Pseudomonas, continue Levaquin, Santyl dressing, wound care following  Acute on Chronic Renal Failure -Patient presented with creatinine of 3.39 at the time of admission likely due to sepsis and #1, baseline creatinine 1.0 as of 8/16 - Improving, creatinine 1.4 today   Hyponatremia  Improving on D5 half-normal IV fluids  Hypokalemia - Replaced  Lactic acidosis likely due to sepsis and shock   patient presented with lactic acid of 4.63, Resolved  with IV fluid hydration   Hyperphosphatemia Resolved  DM - CBG well controlled - Continue sliding scale insulin   H/O CAD Resume BB - follow   H/O HTN - Currently BP stable   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 I called the patient's daughter, Ms. Sharlyne Cai, left a detailed message   Disposition Plan: DC home in a.m.  Time Spent in minutes   25 minutes  Procedures  Bipap  Consults  Patient was admitted by critical care service  DVT Prophylaxis heparin    Medications  Scheduled Meds: . acidophilus  1 capsule Oral Daily  . aspirin  81 mg Oral Daily  . clopidogrel  75 mg Oral Daily  . collagenase   Topical Daily  . heparin  5,000 Units Subcutaneous 3 times per day  . insulin aspart  0-9 Units Subcutaneous TID WC  . ipratropium-albuterol  3 mL Nebulization TID  . levofloxacin  750 mg Oral Q48H   Followed by  . [START ON 02/09/2015] levofloxacin  500 mg Oral Q48H  . metoprolol  50 mg Oral BID   Continuous Infusions: . dextrose 5 % and 0.45% NaCl 50 mL/hr at 02/07/15 1001   PRN Meds:.acetaminophen, albuterol, LORazepam, morphine injection, oxyCODONE, RESOURCE THICKENUP CLEAR   Antibiotics   Anti-infectives    Start     Dose/Rate Route Frequency Ordered Stop   02/09/15 1800  levofloxacin (LEVAQUIN) tablet 500 mg     500 mg Oral Every 48 hours 02/07/15 0959     02/07/15 1800  ciprofloxacin (CIPRO) tablet 500 mg  Status:  Discontinued     500 mg Oral Every evening 02/07/15 0940 02/07/15 0957   02/07/15 1800  levofloxacin (LEVAQUIN) tablet 750 mg     750 mg Oral Every 48 hours 02/07/15 0959 02/09/15 1759   02/06/15 2000  vancomycin (VANCOCIN) IVPB 750 mg/150 ml premix  Status:  Discontinued     750 mg 150 mL/hr over 60 Minutes Intravenous Every 24 hours 02/06/15 1501 02/07/15 0922   02/05/15 1930  vancomycin (VANCOCIN) IVPB 1000 mg/200 mL premix  Status:  Discontinued     1,000 mg 200 mL/hr over 60 Minutes Intravenous Every 48 hours 02/03/15 1917 02/06/15 1501   02/03/15 1930  vancomycin (VANCOCIN) 1,500 mg in sodium chloride 0.9 % 500 mL IVPB     1,500 mg 250 mL/hr over 120 Minutes Intravenous  Once 02/03/15 1915 02/03/15 2145   02/03/15 1915  cefTAZidime (FORTAZ) 1 g in dextrose 5 % 50 mL IVPB  Status:  Discontinued     1 g 100 mL/hr over 30 Minutes Intravenous Every 24 hours 02/03/15 1915 02/07/15 0940        Subjective:   Malayzia Laforte was seen and examined today.  Appears to be close to her baseline, alert and awake,  asking for DC home tomorrow.  No fevers or chills. No coughing. No acute events overnight.  No complaints, denies any pain, nausea or vomiting, abdominal pain O2 sats 97% on 3 L   Objective:   Blood pressure 122/54, pulse 92, temperature 98.2 F (36.8 C), temperature source Oral, resp. rate 18, height 5\' 2"  (1.575 m), weight 61.689 kg (136 lb), SpO2 98 %.  Wt Readings from Last 3 Encounters:  02/05/15 61.689 kg (136 lb)  12/26/14 65.9 kg (145 lb 4.5 oz)  11/03/14 74.163  kg (163 lb 8 oz)     Intake/Output Summary (Last 24 hours) at 02/07/15 1241 Last data filed at 02/07/15 0900  Gross per 24 hour  Intake 1229.17 ml  Output      0 ml  Net 1229.17 ml    Exam  General:  Awake and oriented to self and place, NAD  HEENT:  PERRLA, EOMI,   Neck: Supple, no JVD,   CVS: S1 S2 clear, RRR  Respiratory: Decreased breath sounds at the bases  Abdomen: Soft, nontender, nondistended, + bowel sounds  Ext: no cyanosis clubbing. Bilateral lower extremities and heel protective boots   Neuro: no new deficits  Skin: Sacral wound per WOC   Psych:  normal affect, oriented to self and place    Data Review   Micro Results Recent Results (from the past 240 hour(s))  Culture, blood (routine x 2)     Status: None   Collection Time: 02/03/15  7:25 PM  Result Value Ref Range Status   Specimen Description BLOOD RIGHT ARM  Final   Special Requests BOTTLES DRAWN AEROBIC ONLY 3CC  Final   Culture  Setup Time   Final    GRAM POSITIVE COCCI IN CLUSTERS AEROBIC BOTTLE ONLY CRITICAL RESULT CALLED TO, READ BACK BY AND VERIFIED WITH: Jerl Mina RN [id number] 02/04/15 A BROWNING    Culture   Final    STAPHYLOCOCCUS SPECIES (COAGULASE NEGATIVE) THE SIGNIFICANCE OF ISOLATING THIS ORGANISM FROM A SINGLE SET OF BLOOD CULTURES WHEN MULTIPLE SETS ARE DRAWN IS UNCERTAIN. PLEASE NOTIFY THE MICROBIOLOGY DEPARTMENT WITHIN ONE WEEK IF SPECIATION AND SENSITIVITIES ARE REQUIRED.    Report Status 02/06/2015 FINAL   Final  Culture, blood (routine x 2)     Status: None (Preliminary result)   Collection Time: 02/03/15  7:32 PM  Result Value Ref Range Status   Specimen Description BLOOD RIGHT HAND  Final   Special Requests BOTTLES DRAWN AEROBIC AND ANAEROBIC 5CC  Final   Culture NO GROWTH 3 DAYS  Final   Report Status PENDING  Incomplete  MRSA PCR Screening     Status: None   Collection Time: 02/03/15 11:34 PM  Result Value Ref Range Status   MRSA by PCR NEGATIVE NEGATIVE Final    Comment:        The GeneXpert MRSA Assay (FDA approved for NASAL specimens only), is one component of a comprehensive MRSA colonization surveillance program. It is not intended to diagnose MRSA infection nor to guide or monitor treatment for MRSA infections.   Wound culture     Status: None (Preliminary result)   Collection Time: 02/05/15 11:00 AM  Result Value Ref Range Status   Specimen Description WOUND SACRAL  Final   Special Requests NONE  Final   Gram Stain PENDING  Incomplete   Culture   Final    FEW PSEUDOMONAS AERUGINOSA Performed at [facility]    Report Status PENDING  Incomplete    Radiology Reports Dg Chest Port 1 View  02/05/2015   CLINICAL DATA:  Acute respiratory failure.  EXAM: PORTABLE CHEST 1 VIEW  COMPARISON:  02/04/2015 Lung  FINDINGS: Retrocardiac density on the right is unchanged and may represent atelectasis or pneumonia. Small right effusion has progressed. Left lung remains clear. Negative for heart failure  IMPRESSION: Right lower lobe airspace disease is unchanged. This may represent atelectasis or effusion. Small right effusion has increased in the interval.   Electronically Signed   By: Franchot Gallo M.D.   On: 02/05/2015 09:15  Dg Chest Port 1 View  02/04/2015   CLINICAL DATA:  Respiratory failure, history of COPD and pneumonia.  EXAM: PORTABLE CHEST 1 VIEW  COMPARISON:  Portable chest x-ray of February 03, 2015  FINDINGS: The patient is rotated on today's study. The  lungs are adequately inflated. Increased density at the right lung base persists. This is in part due to the dome of the diaphragm. The heart and pulmonary vascularity are normal. The bony thorax is unremarkable.  IMPRESSION: Allowing for differences in positioning there is no significant interval change in the appearance of the chest. There is persistent right lower lobe atelectasis or pneumonia. When the patient can tolerate the procedure, a PA and lateral chest x-ray would be useful.   Electronically Signed   By: David  Martinique M.D.   On: 02/04/2015 07:22   Dg Chest Port 1 View  02/03/2015   CLINICAL DATA:  Respiratory distress  EXAM: PORTABLE CHEST - 1 VIEW  COMPARISON:  12/25/2014  FINDINGS: Cardiac shadow is stable. Patient is rotated to the right accentuating the mediastinal markings. The lungs are well aerated bilaterally. Some slight increased density is noted in the right lung base which may represent some early infiltrate. No other focal abnormality is noted.  IMPRESSION: Slight increased density in the right lung base. This may represent some early infiltrate or residual from previously noted infiltrate.   Electronically Signed   By: Inez Catalina M.D.   On: 02/03/2015 18:37   Dg Swallowing Func-speech Pathology  02/04/2015    Objective Swallowing Evaluation:    Patient Details  Name: Aila Terra MRN: [id number] Date of Birth: Jul 09, 1934  Today's Date: 02/04/2015 Time: SLP Start Time (ACUTE ONLY): 1300-SLP Stop Time (ACUTE ONLY): 1330 SLP Time Calculation (min) (ACUTE ONLY): 30 min  Past Medical History:  Past Medical History  Diagnosis Date  . Stroke   . COPD (chronic obstructive pulmonary disease)   . Coronary artery disease   . Hypertension   . Diabetes mellitus type 1    Past Surgical History:  Past Surgical History  Procedure Laterality Date  . Total knee arthroplasty Bilateral    HPI:  Other Pertinent Information: 80 y.o. F brought to [facility] ED 9/29 with HCAP.  Placed on BiPAP.  She is  severely debilitated as she has been bed bound  for past 10 years. MD reports Portable CXR 9/30 shows Questionable right  basilar opacity. Otherwise hyperinflation. Possible aspiration pna.   No Data Recorded  Assessment / Plan / Recommendation CHL IP CLINICAL IMPRESSIONS 02/04/2015  Therapy Diagnosis Mild-moderate pharyngeal phase dysphagia;Mild oral phase  dysphagia  Clinical Impression Pt presents with a mild-moderate oropharyngeal based  dysphagia.  Orally pt presented with decreased posterior bolus cohesion  which resulted in premature spillage of boluses into the pharynx.  Swallow  initiation was most consistently triggered at the pyriforms with thin  liquids and at the vallecula with nectars.  Delayed swallow initiation  resulted in penetration of thin liquids and nectar thick liquids which did  not consistently clear during the swallow.  Cues for throat clear and  volitional cough were not consistently effective for clearing penetrates  from the airway.  Use of chin tuck was effective for improving airway  protection during the swallow; however, pt required cues for timing of  chin tuck.  When timing of positioning was not adequate pt was noted with  recurrence of penetration.  Questionable trace aspiration versus  penetration was noted on 1 trial of purees.  No  evidence of aspiration or  penetration was evident with honey thick liquids.  Recommend a  conservative diet of dys 1 textures, honey thick liquids with full  supervision for use of swallowing precautions.  Prognosis for advancement  good with trials of advanced consistencies during ST to reinforce use of  swallowing precautions.        CHL IP TREATMENT RECOMMENDATION 02/04/2015  Treatment Recommendations F/U MBS in --- days (Comment)     CHL IP DIET RECOMMENDATION 02/04/2015  SLP Diet Recommendations Dysphagia 1 (Puree);Honey  Liquid Administration via (None)  Medication Administration Crushed with puree  Compensations (None)  Postural Changes and/or  Swallow Maneuvers (None)     CHL IP OTHER RECOMMENDATIONS 02/04/2015  Recommended Consults (None)  Oral Care Recommendations Oral care BID  Other Recommendations Order thickener from pharmacy     CHL IP FOLLOW UP RECOMMENDATIONS 12/24/2014  Follow up Recommendations Skilled Nursing facility     [facility] IP FREQUENCY AND DURATION 02/04/2015  Speech Therapy Frequency (ACUTE ONLY) min 2x/week  Treatment Duration 2 weeks         SLP Swallow Goals No flowsheet data found.  No flowsheet data found.    CHL IP REASON FOR REFERRAL 02/04/2015  Reason for Referral Objectively evaluate swallowing function     CHL IP ORAL PHASE 02/04/2015  Lips (None)  Tongue (None)  Mucous membranes (None)  Nutritional status (None)  Other (None)  Oxygen therapy (None)  Oral Phase Impaired  Oral - Pudding Teaspoon (None)  Oral - Pudding Cup (None)  Oral - Honey Teaspoon (None)  Oral - Honey Cup (None)  Oral - Honey Syringe (None)  Oral - Nectar Teaspoon (None)  Oral - Nectar Cup (None)  Oral - Nectar Straw (None)  Oral - Nectar Syringe (None)  Oral - Ice Chips (None)  Oral - Thin Teaspoon (None)  Oral - Thin Cup (None)  Oral - Thin Straw (None)  Oral - Thin Syringe (None)  Oral - Puree (None)  Oral - Mechanical Soft (None)  Oral - Regular (None)  Oral - Multi-consistency (None)  Oral - Pill (None)  Oral Phase - Comment (None)      CHL IP PHARYNGEAL PHASE 02/04/2015  Pharyngeal Phase Impaired  Pharyngeal - Pudding Teaspoon (None)  Penetration/Aspiration details (pudding teaspoon) (None)  Pharyngeal - Pudding Cup (None)  Penetration/Aspiration details (pudding cup) (None)  Pharyngeal - Honey Teaspoon (None)  Penetration/Aspiration details (honey teaspoon) (None)  Pharyngeal - Honey Cup (None)  Penetration/Aspiration details (honey cup) (None)  Pharyngeal - Honey Syringe (None)  Penetration/Aspiration details (honey syringe) (None)  Pharyngeal - Nectar Teaspoon (None)  Penetration/Aspiration details (nectar teaspoon) (None)  Pharyngeal - Nectar Cup (None)   Penetration/Aspiration details (nectar cup) (None)  Pharyngeal - Nectar Straw (None)  Penetration/Aspiration details (nectar straw) (None)  Pharyngeal - Nectar Syringe (None)  Penetration/Aspiration details (nectar syringe) (None)  Pharyngeal - Ice Chips (None)  Penetration/Aspiration details (ice chips) (None)  Pharyngeal - Thin Teaspoon (None)  Penetration/Aspiration details (thin teaspoon) (None)  Pharyngeal - Thin Cup (None)  Penetration/Aspiration details (thin cup) (None)  Pharyngeal - Thin Straw (None)  Penetration/Aspiration details (thin straw) (None)  Pharyngeal - Thin Syringe (None)  Penetration/Aspiration details (thin syringe') (None)  Pharyngeal - Puree (None)  Penetration/Aspiration details (puree) (None)  Pharyngeal - Mechanical Soft (None)  Penetration/Aspiration details (mechanical soft) (None)  Pharyngeal - Regular (None)  Penetration/Aspiration details (regular) (None)  Pharyngeal - Multi-consistency (None)  Penetration/Aspiration details (multi-consistency) (None)  Pharyngeal - Pill (None)  Penetration/Aspiration details (  pill) (None)  Pharyngeal Comment (None)             PageSelinda Orion 02/04/2015, 2:24 PM     CBC  Recent Labs Lab 02/03/15 1847 02/03/15 1854 02/04/15 0049 02/05/15 0634 02/06/15 0415 02/07/15 0454  WBC 25.2*  --  29.0* 22.4* 17.4* 15.0*  HGB 12.0 13.9 10.9* 9.0* 9.5* 10.1*  HCT 38.1 41.0 34.5* 28.6* 29.7* 32.9*  PLT 670*  --  648* 538* 543* 545*  MCV 86.6  --  86.5 84.9 85.1 85.5  MCH 27.3  --  27.3 26.7 27.2 26.2  MCHC 31.5  --  31.6 31.5 32.0 30.7  RDW 16.4*  --  16.3* 16.4* 16.8* 17.0*  LYMPHSABS 3.5  --   --   --   --   --   MONOABS 1.3*  --   --   --   --   --   EOSABS 0.0  --   --   --   --   --   BASOSABS 0.0  --   --   --   --   --     Chemistries   Recent Labs Lab 02/03/15 1847 02/03/15 1854 02/04/15 0049 02/05/15 0634 02/06/15 0415 02/07/15 0454  NA 133* 132* 137 139 141 139  K 4.5 4.2 4.5 3.1* 3.9 3.4*  CL 93* 97* 101 106 107 106   CO2 20*  --  15* 19* 20* 21*  GLUCOSE 193* 194* 254* 102* 123* 146*  BUN 80* 72* 69* 63* 56* 44*  CREATININE 3.39* 3.30* 2.95* 2.41* 1.87* 1.48*  CALCIUM 8.7*  --  8.4* 8.2* 8.4* 8.2*  MG  --   --  2.9* 2.5*  --   --   AST  --   --   --   --  14*  --   ALT  --   --   --   --  9*  --   ALKPHOS  --   --   --   --  75  --   BILITOT  --   --   --   --  0.9  --    ------------------------------------------------------------------------------------------------------------------ estimated creatinine clearance is 27.5 mL/min (by C-G formula based on Cr of 1.48). ------------------------------------------------------------------------------------------------------------------ No results for input(s): HGBA1C in the last 72 hours. ------------------------------------------------------------------------------------------------------------------ No results for input(s): CHOL, HDL, LDLCALC, TRIG, CHOLHDL, LDLDIRECT in the last 72 hours. ------------------------------------------------------------------------------------------------------------------ No results for input(s): TSH, T4TOTAL, T3FREE, THYROIDAB in the last 72 hours.  Invalid input(s): FREET3 ------------------------------------------------------------------------------------------------------------------ No results for input(s): VITAMINB12, FOLATE, FERRITIN, TIBC, IRON, RETICCTPCT in the last 72 hours.  Coagulation profile No results for input(s): INR, PROTIME in the last 168 hours.  No results for input(s): DDIMER in the last 72 hours.  Cardiac Enzymes No results for input(s): CKMB, TROPONINI, MYOGLOBIN in the last 168 hours.  Invalid input(s): CK ------------------------------------------------------------------------------------------------------------------ Invalid input(s): [place]  02/06/15 0741 02/06/15 1130 02/06/15 1635 02/06/15 2019 02/07/15 0750 02/07/15 1129  GLUCAP 104* 128* 116* 126* 159* 151*      Pattye Meda M.D. [facility] 02/07/2015, 12:41 PM  Pager: [phone] Between 7am to 7pm - call Pager - [phone]  After 7pm go to [email or web address] - password [email or web address]  Call night coverage person covering after 7pm

## 2015-02-08 LAB — CULTURE, BLOOD (ROUTINE X 2): CULTURE: NO GROWTH

## 2015-02-08 LAB — BASIC METABOLIC PANEL
ANION GAP: 9 (ref 5–15)
BUN: 33 mg/dL — ABNORMAL HIGH (ref 6–20)
CHLORIDE: 108 mmol/L (ref 101–111)
CO2: 22 mmol/L (ref 22–32)
Calcium: 7.8 mg/dL — ABNORMAL LOW (ref 8.9–10.3)
Creatinine, Ser: 1.26 mg/dL — ABNORMAL HIGH (ref 0.44–1.00)
GFR calc Af Amer: 46 mL/min — ABNORMAL LOW (ref >60)
GFR, EST NON AFRICAN AMERICAN: 40 mL/min — AB (ref >60)
GLUCOSE: 133 mg/dL — AB (ref 65–99)
POTASSIUM: 3.2 mmol/L — AB (ref 3.5–5.1)
Sodium: 139 mmol/L (ref 135–145)

## 2015-02-08 LAB — CBC
HEMATOCRIT: 28.5 % — AB (ref 36.0–46.0)
HEMOGLOBIN: 8.7 g/dL — AB (ref 12.0–15.0)
MCH: 26.1 pg (ref 26.0–34.0)
MCHC: 30.5 g/dL (ref 30.0–36.0)
MCV: 85.6 fL (ref 78.0–100.0)
Platelets: 464 10*3/uL — ABNORMAL HIGH (ref 150–400)
RBC: 3.33 MIL/uL — ABNORMAL LOW (ref 3.87–5.11)
RDW: 16.8 % — ABNORMAL HIGH (ref 11.5–15.5)
WBC: 15 10*3/uL — ABNORMAL HIGH (ref 4.0–10.5)

## 2015-02-08 LAB — GLUCOSE, CAPILLARY
GLUCOSE-CAPILLARY: 139 mg/dL — AB (ref 65–99)
Glucose-Capillary: 126 mg/dL — ABNORMAL HIGH (ref 65–99)
Glucose-Capillary: 103 mg/dL — ABNORMAL HIGH (ref 65–99)
Glucose-Capillary: 102 mg/dL — ABNORMAL HIGH (ref 65–99)

## 2015-02-08 LAB — WOUND CULTURE

## 2015-02-08 MED ORDER — LEVOFLOXACIN 750 MG PO TABS
750.0000 mg | ORAL_TABLET | ORAL | Status: DC
Start: 2015-02-09 — End: 2015-02-09

## 2015-02-08 MED ORDER — POTASSIUM CHLORIDE CRYS ER 20 MEQ PO TBCR
40.0000 meq | EXTENDED_RELEASE_TABLET | Freq: Once | ORAL | Status: AC
  Administered 2015-02-08: 40 meq via ORAL
  Filled 2015-02-08: qty 2

## 2015-02-08 MED ORDER — IPRATROPIUM-ALBUTEROL 0.5-2.5 (3) MG/3ML IN SOLN: 3.0000 mL | Freq: Four times a day (QID) | RESPIRATORY_TRACT | Status: DC | PRN

## 2015-02-08 NOTE — Progress Notes (Signed)
Rt checked with pt at 14:49 for scheduled HHN tx, pt refused saying she was "tired of it all" and did not want tx. RT checked back at 15:45 to see if she had changed her mind and wanted one. Pt stated she did not want them any longer. Rt informed pt to call if she changed her mind and decided she wanted one. HHN tx's changed to PRN. RT will continue to monitor.

## 2015-02-08 NOTE — Clinical Documentation Improvement (Signed)
Critical Care  Can the diagnosis of CKD be further specified in progress notes and discharge summary?   CKD Stage I - GFR greater than or equal to 90  CKD Stage II - GFR 60-89  CKD Stage III - GFR 30-59  CKD Stage IV - GFR 15-29  CKD Stage V - GFR < 15  ESRD (End Stage Renal Disease)  Other condition  Unable to clinically determine   Supporting Information: :  ED Provider note Acute on chronic renal failure  H&P AoCKD - baseline SCr ~ 1.1. AKI- avoid contrast, hydration Hold ACE 9/30 Progress note: RENAL A:  Acute on Chronic Renal Failure - Improving. Likely due to septic shock. 10/1 progress note Acute on Chronic Renal Failure Improving - due to septic shock - baseline crt 1.0 as of Aug 2016  Less output than intake noted in flow sheet  Component     Latest Ref Rng 02/03/2015 02/03/2015 02/04/2015 02/05/2015         6:47 PM  6:54 PM    BUN     6 - 20 mg/dL 80 (H) 72 (H) 69 (H) 63 (H)  Creatinine     0.44 - 1.00 mg/dL 3.39 (H) 3.30 (H) 2.95 (H) 2.41 (H)                                      EGFR (African American)     >60 mL/min 14 (L)  17 (L) 21 (L)  EGFR (Non-African Amer.)     >60 mL/min 12 (L)  14 (L) 18 (L)   Component     Latest Ref Rng 02/06/2015 02/07/2015 02/08/2015            BUN     6 - 20 mg/dL 56 (H) 44 (H) 33 (H)  Creatinine     0.44 - 1.00 mg/dL 1.87 (H) 1.48 (H) 1.26 (H)                                EGFR (African American)     >60 mL/min 29 (L) 38 (L) 46 (L)  EGFR (Non-African Amer.)     >60 mL/min 25 (L) 33 (L) 40 (L)   Treatment Monitor BMPs daily Strict I&O Daily weight  Please exercise your independent, professional judgment when responding. A specific answer is not anticipated or expected.   Thank You, [place] [phone]

## 2015-02-08 NOTE — Progress Notes (Signed)
[facility]                                                                              Patient Demographics  Brenda Hogan, is a 80 y.o. female, DOB - [birth year], ZYS:[id number]  Admit date - 02/03/2015   Admitting Physician Brenda Noel, MD  Outpatient Primary MD for the patient is Brenda Chou, NP  LOS - 5   Chief Complaint  Patient presents with  . Respiratory Distress       Brief HPI   The patient is a 80 year old female with history of CAD, diabetes mellitus, hypertension and COPD presented to ED on 9/29 with shortness of breath that had been ongoing for the past 2 days prior to admission. Otherwise denied any productive cough or fevers. EMS was called and patient was found to have wheezing along with diffuse rails, and respiratory distress. She was treated en route with Solu-Medrol magnesium, albuterol and placed on CPAP. O2 sats were 88%. In ED, patient was placed on BiPAP with improvement in the sats. She was also found to be in acute kidney injury, creatinine of 3.3, baseline 1.1 with lactic acidosis. Chest x-ray was concerning for right-sided pneumonia. Code sepsis was called, due to hypotension and septic shock not responding to IV fluids, patient was started on vasopressors. Patient was admitted by critical care service to the ICU.  Patient was also found to have pressure sores, had been on O2 3 L continuous. Previously she was admitted to [facility] in 12/2014 for aspiration. She is severely debilitated as she has been bed bound for past 10 years.   Palliative medicine consult was obtained on 02/04/15 for goals of care. Patient was evaluated by speech therapy on 9/30, recommended dysphagia 1 puree diet,  meds crushed with puree. The patient has been followed closely by wound care as well during hospitalization. She was transferred to the stepdown unit and to the [facility] on 02/05/15.  Assessment & Plan    Principal  problem Acute on chronic hypoxic respiratory failure due to RLL HCAP v/s Aspiration Pneumonia - Now significantly improved, off BiPAP, O2 sats 98% on 2 L  - Patient was evaluated by speech therapy, recommended dysphagia 1 diet, honey thick liquids, crushed pills in applesauce with full supervision. - Patient was on vancomycin and Fortaz, narrowed to Levaquin on 10/3. -WBC still 15K   Active problems Shock - Sepsis versus dehydration - Hemodynamically stable at the present time, lactate has normalized. Off vasopressors  1/2 blood cx gram+ cocci clusters - Blood cultures shows 1/2 coagulase-negative staph. DC'd vancomycin  Sacral Pressure ulcers, Pseudomonas - DME low-air-loss mattress and hospital bed requested -Wound culture showing Pseudomonas, continue Levaquin, Santyl dressing, wound care following  Acute on Chronic Renal Failure -Patient presented with creatinine of 3.39 at the time of admission likely due to sepsis and #1, baseline creatinine 1.0 as of 8/16 - Improving, creatinine 1.4 today   Hyponatremia  Improving on D5 half-normal IV fluids  Hypokalemia - Replaced  Lactic acidosis likely due to sepsis and shock   patient presented with lactic acid of 4.63, Resolved  with  IV fluid hydration   Hyperphosphatemia Resolved  DM - CBG well controlled - Continue sliding scale insulin   H/O CAD Resume BB - follow   H/O HTN - Currently BP stable   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  Disposition Plan: DC home in a.m.  Time Spent in minutes   25 minutes  Procedures  Bipap  Consults  Patient was admitted by critical care service  DVT Prophylaxis heparin   Medications  Scheduled Meds: . acidophilus  1 capsule Oral Daily  . aspirin  81 mg Oral Daily  . clopidogrel  75 mg Oral  Daily  . collagenase   Topical Daily  . heparin  5,000 Units Subcutaneous 3 times per day  . insulin aspart  0-9 Units Subcutaneous TID WC  . ipratropium-albuterol  3 mL Nebulization TID  . [START ON 02/09/2015] levofloxacin  750 mg Oral Q48H  . metoprolol  50 mg Oral BID   Continuous Infusions: . dextrose 5 % and 0.45% NaCl 50 mL/hr at 02/08/15 0611   PRN Meds:.acetaminophen, albuterol, LORazepam, morphine injection, oxyCODONE, RESOURCE THICKENUP CLEAR   Antibiotics   Anti-infectives    Start     Dose/Rate Route Frequency Ordered Stop   02/09/15 1800  levofloxacin (LEVAQUIN) tablet 500 mg  Status:  Discontinued     500 mg Oral Every 48 hours 02/07/15 0959 02/08/15 0846   02/09/15 1800  levofloxacin (LEVAQUIN) tablet 750 mg     750 mg Oral Every 48 hours 02/08/15 0846     02/07/15 1800  ciprofloxacin (CIPRO) tablet 500 mg  Status:  Discontinued     500 mg Oral Every evening 02/07/15 0940 02/07/15 0957   02/07/15 1800  levofloxacin (LEVAQUIN) tablet 750 mg     750 mg Oral Every 48 hours 02/07/15 0959 02/07/15 1746   02/06/15 2000  vancomycin (VANCOCIN) IVPB 750 mg/150 ml premix  Status:  Discontinued     750 mg 150 mL/hr over 60 Minutes Intravenous Every 24 hours 02/06/15 1501 02/07/15 0922   02/05/15 1930  vancomycin (VANCOCIN) IVPB 1000 mg/200 mL premix  Status:  Discontinued     1,000 mg 200 mL/hr over 60 Minutes Intravenous Every 48 hours 02/03/15 1917 02/06/15 1501   02/03/15 1930  vancomycin (VANCOCIN) 1,500 mg in sodium chloride 0.9 % 500 mL IVPB     1,500 mg 250 mL/hr over 120 Minutes Intravenous  Once 02/03/15 1915 02/03/15 2145   02/03/15 1915  cefTAZidime (FORTAZ) 1 g in dextrose 5 % 50 mL IVPB  Status:  Discontinued     1 g 100 mL/hr over 30 Minutes Intravenous Every 24 hours 02/03/15 1915 02/07/15 0940        Subjective:   Brenda Hogan was seen and examined today. Somewhat anxious today, states does not feel too good. Afebrile.   No coughing. No acute events  overnight.  No complaints, denies any pain, nausea or vomiting, abdominal pain O2 sats 99% on 2 L  Objective:   Blood pressure 130/68, pulse 97, temperature 98.4 F (36.9 C), temperature source Oral, resp. rate 16, height 5\' 2"  (1.575 m), weight 61.8 kg (136 lb 3.9 oz), SpO2 95 %.  Wt Readings from Last 3 Encounters:  02/07/15 61.8 kg (136 lb 3.9 oz)  12/26/14 65.9 kg (145 lb 4.5 oz)  11/03/14 74.163 kg (163 lb 8  oz)     Intake/Output Summary (Last 24 hours) at 02/08/15 1133 Last data filed at 02/08/15 0602  Gross per 24 hour  Intake 1131.67 ml  Output    550 ml  Net 581.67 ml    Exam  General:  Awake and oriented to self and place, NAD  HEENT:  PERRLA, EOMI,   Neck: Supple, no JVD,   CVS: S1 S2 clear, RRR  Respiratory: Decreased breath sounds at the bases  Abdomen: Soft, nontender, nondistended, + bowel sounds  Ext: no cyanosis clubbing. Bilateral lower extremities and heel protective boots   Neuro: no new deficits  Skin: Sacral wound per WOC   Psych:  normal affect, oriented to self and place    Data Review   Micro Results Recent Results (from the past 240 hour(s))  Culture, blood (routine x 2)     Status: None   Collection Time: 02/03/15  7:25 PM  Result Value Ref Range Status   Specimen Description BLOOD RIGHT ARM  Final   Special Requests BOTTLES DRAWN AEROBIC ONLY 3CC  Final   Culture  Setup Time   Final    GRAM POSITIVE COCCI IN CLUSTERS AEROBIC BOTTLE ONLY CRITICAL RESULT CALLED TO, READ BACK BY AND VERIFIED WITH: Jerl Mina RN [id number] 02/04/15 A BROWNING    Culture   Final    STAPHYLOCOCCUS SPECIES (COAGULASE NEGATIVE) THE SIGNIFICANCE OF ISOLATING THIS ORGANISM FROM A SINGLE SET OF BLOOD CULTURES WHEN MULTIPLE SETS ARE DRAWN IS UNCERTAIN. PLEASE NOTIFY THE MICROBIOLOGY DEPARTMENT WITHIN ONE WEEK IF SPECIATION AND SENSITIVITIES ARE REQUIRED.    Report Status 02/06/2015 FINAL  Final  Culture, blood (routine x 2)     Status: None (Preliminary result)    Collection Time: 02/03/15  7:32 PM  Result Value Ref Range Status   Specimen Description BLOOD RIGHT HAND  Final   Special Requests BOTTLES DRAWN AEROBIC AND ANAEROBIC 5CC  Final   Culture NO GROWTH 4 DAYS  Final   Report Status PENDING  Incomplete  MRSA PCR Screening     Status: None   Collection Time: 02/03/15 11:34 PM  Result Value Ref Range Status   MRSA by PCR NEGATIVE NEGATIVE Final    Comment:        The GeneXpert MRSA Assay (FDA approved for NASAL specimens only), is one component of a comprehensive MRSA colonization surveillance program. It is not intended to diagnose MRSA infection nor to guide or monitor treatment for MRSA infections.   Wound culture     Status: None   Collection Time: 02/05/15 11:00 AM  Result Value Ref Range Status   Specimen Description WOUND SACRAL  Final   Special Requests NONE  Final   Gram Stain   Final    RARE WBC PRESENT, PREDOMINANTLY PMN NO SQUAMOUS EPITHELIAL CELLS SEEN RARE GRAM NEGATIVE RODS Performed at [facility]    Culture   Final    FEW PSEUDOMONAS AERUGINOSA Performed at [facility]    Report Status 02/08/2015 FINAL  Final   Organism ID, Bacteria PSEUDOMONAS AERUGINOSA  Final      Susceptibility   Pseudomonas aeruginosa - MIC*    CEFEPIME <=1 SENSITIVE Sensitive     CEFTAZIDIME 4 SENSITIVE Sensitive     CIPROFLOXACIN <=0.25 SENSITIVE Sensitive     GENTAMICIN <=1 SENSITIVE Sensitive     IMIPENEM 2 SENSITIVE Sensitive     PIP/TAZO 8 SENSITIVE Sensitive     TOBRAMYCIN <=1 SENSITIVE Sensitive     * FEW  PSEUDOMONAS AERUGINOSA    Radiology Reports Dg Chest Port 1 View  02/05/2015   CLINICAL DATA:  Acute respiratory failure.  EXAM: PORTABLE CHEST 1 VIEW  COMPARISON:  02/04/2015 Lung  FINDINGS: Retrocardiac density on the right is unchanged and may represent atelectasis or pneumonia. Small right effusion has progressed. Left lung remains clear. Negative for heart failure  IMPRESSION: Right lower lobe  airspace disease is unchanged. This may represent atelectasis or effusion. Small right effusion has increased in the interval.   Electronically Signed   By: Franchot Gallo M.D.   On: 02/05/2015 09:15   Dg Chest Port 1 View  02/04/2015   CLINICAL DATA:  Respiratory failure, history of COPD and pneumonia.  EXAM: PORTABLE CHEST 1 VIEW  COMPARISON:  Portable chest x-ray of February 03, 2015  FINDINGS: The patient is rotated on today's study. The lungs are adequately inflated. Increased density at the right lung base persists. This is in part due to the dome of the diaphragm. The heart and pulmonary vascularity are normal. The bony thorax is unremarkable.  IMPRESSION: Allowing for differences in positioning there is no significant interval change in the appearance of the chest. There is persistent right lower lobe atelectasis or pneumonia. When the patient can tolerate the procedure, a PA and lateral chest x-ray would be useful.   Electronically Signed   By: David  Martinique M.D.   On: 02/04/2015 07:22   Dg Chest Port 1 View  02/03/2015   CLINICAL DATA:  Respiratory distress  EXAM: PORTABLE CHEST - 1 VIEW  COMPARISON:  12/25/2014  FINDINGS: Cardiac shadow is stable. Patient is rotated to the right accentuating the mediastinal markings. The lungs are well aerated bilaterally. Some slight increased density is noted in the right lung base which may represent some early infiltrate. No other focal abnormality is noted.  IMPRESSION: Slight increased density in the right lung base. This may represent some early infiltrate or residual from previously noted infiltrate.   Electronically Signed   By: Inez Catalina M.D.   On: 02/03/2015 18:37   Dg Swallowing Func-speech Pathology  02/04/2015    Objective Swallowing Evaluation:    Patient Details  Name: Brenda Hogan MRN: [id number] Date of Birth: July 20, 1934  Today's Date: 02/04/2015 Time: SLP Start Time (ACUTE ONLY): 1300-SLP Stop Time (ACUTE ONLY): 1330 SLP Time  Calculation (min) (ACUTE ONLY): 30 min  Past Medical History:  Past Medical History  Diagnosis Date  . Stroke   . COPD (chronic obstructive pulmonary disease)   . Coronary artery disease   . Hypertension   . Diabetes mellitus type 1    Past Surgical History:  Past Surgical History  Procedure Laterality Date  . Total knee arthroplasty Bilateral    HPI:  Other Pertinent Information: 80 y.o. F brought to [facility] ED 9/29 with HCAP.  Placed on BiPAP.  She is severely debilitated as she has been bed bound  for past 10 years. MD reports Portable CXR 9/30 shows Questionable right  basilar opacity. Otherwise hyperinflation. Possible aspiration pna.   No Data Recorded  Assessment / Plan / Recommendation CHL IP CLINICAL IMPRESSIONS 02/04/2015  Therapy Diagnosis Mild-moderate pharyngeal phase dysphagia;Mild oral phase  dysphagia  Clinical Impression Pt presents with a mild-moderate oropharyngeal based  dysphagia.  Orally pt presented with decreased posterior bolus cohesion  which resulted in premature spillage of boluses into the pharynx.  Swallow  initiation was most consistently triggered at the pyriforms with thin  liquids and at the vallecula with  nectars.  Delayed swallow initiation  resulted in penetration of thin liquids and nectar thick liquids which did  not consistently clear during the swallow.  Cues for throat clear and  volitional cough were not consistently effective for clearing penetrates  from the airway.  Use of chin tuck was effective for improving airway  protection during the swallow; however, pt required cues for timing of  chin tuck.  When timing of positioning was not adequate pt was noted with  recurrence of penetration.  Questionable trace aspiration versus  penetration was noted on 1 trial of purees.  No evidence of aspiration or  penetration was evident with honey thick liquids.  Recommend a  conservative diet of dys 1 textures, honey thick liquids with full  supervision for use of swallowing precautions.   Prognosis for advancement  good with trials of advanced consistencies during ST to reinforce use of  swallowing precautions.        CHL IP TREATMENT RECOMMENDATION 02/04/2015  Treatment Recommendations F/U MBS in --- days (Comment)     CHL IP DIET RECOMMENDATION 02/04/2015  SLP Diet Recommendations Dysphagia 1 (Puree);Honey  Liquid Administration via (None)  Medication Administration Crushed with puree  Compensations (None)  Postural Changes and/or Swallow Maneuvers (None)     CHL IP OTHER RECOMMENDATIONS 02/04/2015  Recommended Consults (None)  Oral Care Recommendations Oral care BID  Other Recommendations Order thickener from pharmacy     CHL IP FOLLOW UP RECOMMENDATIONS 12/24/2014  Follow up Recommendations Skilled Nursing facility     [facility] IP FREQUENCY AND DURATION 02/04/2015  Speech Therapy Frequency (ACUTE ONLY) min 2x/week  Treatment Duration 2 weeks         SLP Swallow Goals No flowsheet data found.  No flowsheet data found.    CHL IP REASON FOR REFERRAL 02/04/2015  Reason for Referral Objectively evaluate swallowing function     CHL IP ORAL PHASE 02/04/2015  Lips (None)  Tongue (None)  Mucous membranes (None)  Nutritional status (None)  Other (None)  Oxygen therapy (None)  Oral Phase Impaired  Oral - Pudding Teaspoon (None)  Oral - Pudding Cup (None)  Oral - Honey Teaspoon (None)  Oral - Honey Cup (None)  Oral - Honey Syringe (None)  Oral - Nectar Teaspoon (None)  Oral - Nectar Cup (None)  Oral - Nectar Straw (None)  Oral - Nectar Syringe (None)  Oral - Ice Chips (None)  Oral - Thin Teaspoon (None)  Oral - Thin Cup (None)  Oral - Thin Straw (None)  Oral - Thin Syringe (None)  Oral - Puree (None)  Oral - Mechanical Soft (None)  Oral - Regular (None)  Oral - Multi-consistency (None)  Oral - Pill (None)  Oral Phase - Comment (None)      CHL IP PHARYNGEAL PHASE 02/04/2015  Pharyngeal Phase Impaired  Pharyngeal - Pudding Teaspoon (None)  Penetration/Aspiration details (pudding teaspoon) (None)  Pharyngeal - Pudding Cup  (None)  Penetration/Aspiration details (pudding cup) (None)  Pharyngeal - Honey Teaspoon (None)  Penetration/Aspiration details (honey teaspoon) (None)  Pharyngeal - Honey Cup (None)  Penetration/Aspiration details (honey cup) (None)  Pharyngeal - Honey Syringe (None)  Penetration/Aspiration details (honey syringe) (None)  Pharyngeal - Nectar Teaspoon (None)  Penetration/Aspiration details (nectar teaspoon) (None)  Pharyngeal - Nectar Cup (None)  Penetration/Aspiration details (nectar cup) (None)  Pharyngeal - Nectar Straw (None)  Penetration/Aspiration details (nectar straw) (None)  Pharyngeal - Nectar Syringe (None)  Penetration/Aspiration details (nectar syringe) (None)  Pharyngeal - Ice Chips (None)  Penetration/Aspiration  details (ice chips) (None)  Pharyngeal - Thin Teaspoon (None)  Penetration/Aspiration details (thin teaspoon) (None)  Pharyngeal - Thin Cup (None)  Penetration/Aspiration details (thin cup) (None)  Pharyngeal - Thin Straw (None)  Penetration/Aspiration details (thin straw) (None)  Pharyngeal - Thin Syringe (None)  Penetration/Aspiration details (thin syringe') (None)  Pharyngeal - Puree (None)  Penetration/Aspiration details (puree) (None)  Pharyngeal - Mechanical Soft (None)  Penetration/Aspiration details (mechanical soft) (None)  Pharyngeal - Regular (None)  Penetration/Aspiration details (regular) (None)  Pharyngeal - Multi-consistency (None)  Penetration/Aspiration details (multi-consistency) (None)  Pharyngeal - Pill (None)  Penetration/Aspiration details (pill) (None)  Pharyngeal Comment (None)             Page, Selinda Orion 02/04/2015, 2:24 PM     CBC  Recent Labs Lab 02/03/15 1847  02/04/15 0049 02/05/15 0634 02/06/15 0415 02/07/15 0454 02/08/15 0655  WBC 25.2*  --  29.0* 22.4* 17.4* 15.0* 15.0*  HGB 12.0  < > 10.9* 9.0* 9.5* 10.1* 8.7*  HCT 38.1  < > 34.5* 28.6* 29.7* 32.9* 28.5*  PLT 670*  --  648* 538* 543* 545* 464*  MCV 86.6  --  86.5 84.9 85.1 85.5 85.6  MCH 27.3   --  27.3 26.7 27.2 26.2 26.1  MCHC 31.5  --  31.6 31.5 32.0 30.7 30.5  RDW 16.4*  --  16.3* 16.4* 16.8* 17.0* 16.8*  LYMPHSABS 3.5  --   --   --   --   --   --   MONOABS 1.3*  --   --   --   --   --   --   EOSABS 0.0  --   --   --   --   --   --   BASOSABS 0.0  --   --   --   --   --   --   < > = values in this interval not displayed.  Chemistries   Recent Labs Lab 02/04/15 0049 02/05/15 0634 02/06/15 0415 02/07/15 0454 02/08/15 0655  NA 137 139 141 139 139  K 4.5 3.1* 3.9 3.4* 3.2*  CL 101 106 107 106 108  CO2 15* 19* 20* 21* 22  GLUCOSE 254* 102* 123* 146* 133*  BUN 69* 63* 56* 44* 33*  CREATININE 2.95* 2.41* 1.87* 1.48* 1.26*  CALCIUM 8.4* 8.2* 8.4* 8.2* 7.8*  MG 2.9* 2.5*  --   --   --   AST  --   --  14*  --   --   ALT  --   --  9*  --   --   ALKPHOS  --   --  75  --   --   BILITOT  --   --  0.9  --   --    ------------------------------------------------------------------------------------------------------------------ estimated creatinine clearance is 32.3 mL/min (by C-G formula based on Cr of 1.26). ------------------------------------------------------------------------------------------------------------------ No results for input(s): HGBA1C in the last 72 hours. ------------------------------------------------------------------------------------------------------------------ No results for input(s): CHOL, HDL, LDLCALC, TRIG, CHOLHDL, LDLDIRECT in the last 72 hours. ------------------------------------------------------------------------------------------------------------------ No results for input(s): TSH, T4TOTAL, T3FREE, THYROIDAB in the last 72 hours.  Invalid input(s): FREET3 ------------------------------------------------------------------------------------------------------------------ No results for input(s): VITAMINB12, FOLATE, FERRITIN, TIBC, IRON, RETICCTPCT in the last 72 hours.  Coagulation profile No results for input(s): INR, PROTIME in the  last 168 hours.  No results for input(s): DDIMER in the last 72 hours.  Cardiac Enzymes No results for input(s): CKMB, TROPONINI, MYOGLOBIN in the last 168 hours.  Invalid input(s): CK ------------------------------------------------------------------------------------------------------------------  Invalid input(s): [place]  02/06/15 2019 02/07/15 0750 02/07/15 1129 02/07/15 1653 02/07/15 2032 02/08/15 0736  GLUCAP 126* 159* 151* 117* 126* 126*     Maurisa Tesmer M.D. [facility] 02/08/2015, 11:33 AM  Pager: [phone] Between 7am to 7pm - call Pager - [phone]  After 7pm go to [email or web address] - password [email or web address]  Call night coverage person covering after 7pm

## 2015-02-08 NOTE — Care Management (Signed)
Per Dr Tyler Pita, this pt will not d/c today,plan is to d/c tomorrow 02/09/2015. [facility] notified of plan to d/c tomorrow, 02/09/2015.    CRoyal RN MPH, case manager, [phone]

## 2015-02-08 NOTE — Progress Notes (Signed)
Speech Language Pathology Treatment: Dysphagia  Patient Details Name: Brenda Hogan MRN: [id number] DOB: [birth year] Today's Date: 02/08/2015 Time: 6503-5465 SLP Time Calculation (min) (ACUTE ONLY): 20 min  Assessment / Plan / Recommendation Clinical Impression  Pt indicated being uncomfortable - head tilted to the left, knees bent. Pt did not want to be repositioned. Pt was seated more upright for po presentation. Pt accepted minimal intake of puree and honey thick liquids. Pt unable to drink honey thick liquids via straw, and could not hold cup independently. SLP provided teaspoon boluses of liquid with no overt s/s aspiration observed. Pt appeared to tolerate puree consistency as well, with no oral residue and no overt s/s aspiration. Concern re: not meeting nutrition/hydration needs, with associated potential issues with current intake.   HPI Other Pertinent Information: 80 y.o. F brought to [facility] ED 9/29 with HCAP. Placed on BiPAP.  She is severely debilitated as she has been bed bound for past 10 years. MD reports Portable CXR 9/30 shows Questionable right basilar opacity. Otherwise hyperinflation. Possible aspiration pna.    Pertinent Vitals Pain Assessment: No/denies pain  SLP Plan  Continue with current plan of care    Recommendations Diet recommendations: Dysphagia 1 (puree);Honey-thick liquid Liquids provided via: Cup;Teaspoon Medication Administration: Crushed with puree Supervision: Full supervision/cueing for compensatory strategies;Trained caregiver to feed patient Compensations: Slow rate;Small sips/bites;Minimize environmental distractions Postural Changes and/or Swallow Maneuvers: Seated upright 90 degrees;Upright 30-60 min after meal             Oral Care Recommendations: Oral care BID Plan: Continue with current plan of care    GO     Brenda Hogan 02/08/2015, 2:03 PM   Brenda Hogan [facility], [place] [phone]

## 2015-02-08 NOTE — Consult Note (Signed)
WOC follow up, patient has been transferred to [facility], she has been placed on LALM.  Continue enzymatic debridement ointment.  No aggressive wound care at this time due to pain and palliative care needs.  Plans for her to DC with hospice care per notes.  Please notify [facility] nurses for any acute wound care needs.  Wisam [place] RN,CWOCN [phone]

## 2015-02-09 LAB — BASIC METABOLIC PANEL
ANION GAP: 10 (ref 5–15)
BUN: 23 mg/dL — ABNORMAL HIGH (ref 6–20)
CHLORIDE: 106 mmol/L (ref 101–111)
CO2: 22 mmol/L (ref 22–32)
CREATININE: 1.05 mg/dL — AB (ref 0.44–1.00)
Calcium: 7.8 mg/dL — ABNORMAL LOW (ref 8.9–10.3)
GFR calc non Af Amer: 50 mL/min — ABNORMAL LOW (ref >60)
GFR, EST AFRICAN AMERICAN: 58 mL/min — AB (ref >60)
Glucose, Bld: 126 mg/dL — ABNORMAL HIGH (ref 65–99)
Potassium: 3.5 mmol/L (ref 3.5–5.1)
SODIUM: 138 mmol/L (ref 135–145)

## 2015-02-09 LAB — CBC
HCT: 29.9 % — ABNORMAL LOW (ref 36.0–46.0)
HEMOGLOBIN: 9.4 g/dL — AB (ref 12.0–15.0)
MCH: 27 pg (ref 26.0–34.0)
MCHC: 31.4 g/dL (ref 30.0–36.0)
MCV: 85.9 fL (ref 78.0–100.0)
PLATELETS: 453 10*3/uL — AB (ref 150–400)
RBC: 3.48 MIL/uL — AB (ref 3.87–5.11)
RDW: 17.1 % — ABNORMAL HIGH (ref 11.5–15.5)
WBC: 15.4 10*3/uL — AB (ref 4.0–10.5)

## 2015-02-09 LAB — GLUCOSE, CAPILLARY
GLUCOSE-CAPILLARY: 129 mg/dL — AB (ref 65–99)
Glucose-Capillary: 103 mg/dL — ABNORMAL HIGH (ref 65–99)

## 2015-02-09 MED ORDER — INSULIN ASPART 100 UNIT/ML ~~LOC~~ SOLN: 0.0000 [IU] | Freq: Three times a day (TID) | SUBCUTANEOUS | Status: AC

## 2015-02-09 MED ORDER — RISAQUAD PO CAPS: 1.0000 | ORAL_CAPSULE | Freq: Every day | ORAL | Status: AC

## 2015-02-09 MED ORDER — OXYCODONE HCL 5 MG PO TABS: 5.0000 mg | ORAL_TABLET | Freq: Four times a day (QID) | ORAL | Status: AC | PRN

## 2015-02-09 MED ORDER — LORAZEPAM 0.5 MG PO TABS: 0.5000 mg | ORAL_TABLET | Freq: Two times a day (BID) | ORAL | Status: AC | PRN

## 2015-02-09 MED ORDER — RESOURCE THICKENUP CLEAR PO POWD: ORAL | Status: AC

## 2015-02-09 MED ORDER — COLLAGENASE 250 UNIT/GM EX OINT: TOPICAL_OINTMENT | Freq: Every day | CUTANEOUS | Status: AC

## 2015-02-09 MED ORDER — LEVOFLOXACIN 750 MG PO TABS: 750.0000 mg | ORAL_TABLET | ORAL | Status: AC

## 2015-02-09 NOTE — Clinical Social Work Note (Signed)
Patient medically stable for discharge and ambulance transport requested. CSW talked with patient and confirmed address and [facility] called.  CSW signing off.   Tongela Encinas [place], MSW, LCSW Licensed Clinical Social Worker [place] [phone]

## 2015-02-09 NOTE — Progress Notes (Signed)
Discharge instructions and medications discussed with Gerald Stabs (pt's grandson).  All questions answered.  Discharge instructions and prescriptions in patient's belonging bag.

## 2015-02-09 NOTE — Discharge Summary (Signed)
Physician Discharge Summary   Patient ID: Brenda Hogan MRN: [id number] DOB/AGE: 1934/10/12 80 y.o.  Admit date: 02/03/2015 Discharge date: 02/09/2015  Primary Care Physician:  Alvester Chou, NP  Discharge Diagnoses:      Acute on chronic hypoxic respiratory failure  . 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 Crush pills in applesauce and full supervision    Allergies:  No Known Allergies   Discharge Medications:   Medication List    STOP taking these medications        ALPRAZolam 0.25 MG tablet  Commonly known as:  XANAX     amoxicillin-clavulanate 875-125 MG tablet  Commonly known as:  AUGMENTIN     aspirin EC 81 MG tablet     atorvastatin 40 MG tablet  Commonly known as:  LIPITOR     furosemide 40 MG tablet  Commonly known as:  LASIX     insulin detemir 100 UNIT/ML injection  Commonly known as:  LEVEMIR     isosorbide-hydrALAZINE 20-37.5 MG tablet  Commonly known as:  BIDIL     lisinopril 2.5 MG tablet  Commonly known as:  PRINIVIL,ZESTRIL     potassium chloride SA 20 MEQ tablet  Commonly known as:  K-DUR,KLOR-CON     rosuvastatin 10 MG tablet  Commonly known as:  CRESTOR      TAKE these medications        acidophilus Caps capsule  Take 1 capsule by mouth daily.     albuterol 108 (90 BASE) MCG/ACT inhaler  Commonly known as:  PROVENTIL  HFA;VENTOLIN HFA  Inhale 2 puffs into the lungs every 6 (six) hours as needed for wheezing or shortness of breath. 2 puffs 3 times daily x 3 days then use as needed.     clopidogrel 75 MG tablet  Commonly known as:  PLAVIX  Take 75 mg by mouth daily.     collagenase ointment  Commonly known as:  SANTYL  Apply topically daily. Apply to sacral and ischial wounds daily     feeding supplement (GLUCERNA SHAKE) Liqd  Take 237 mLs by mouth 2 (two) times daily between meals.     insulin aspart 100 UNIT/ML injection  Commonly known as:  novoLOG  Inject 0-9 Units into the skin 3 (three) times daily with meals. Sliding scale CBG 70 - 120: 0 units CBG 121 - 150: 1 unit,  CBG 151 - 200: 2 units,  CBG 201 - 250: 3 units,  CBG 251 - 300: 5 units,  CBG 301 - 350: 7 units,  CBG 351 - 400: 9 units   CBG > 400: 9 units and notify your MD     ipratropium-albuterol 0.5-2.5 (3) MG/3ML Soln  Commonly known as:  DUONEB  Take 3 mLs by nebulization 2 (two) times daily.     levofloxacin 750 MG tablet  Commonly known as:  LEVAQUIN  Take 1 tablet (750 mg total) by mouth every other day. X 7 doses  Start taking on:  02/11/2015     LORazepam  0.5 MG tablet  Commonly known as:  ATIVAN  Take 1 tablet (0.5 mg total) by mouth every 12 (twelve) hours as needed (agitation/anxiety).     metoprolol 50 MG tablet  Commonly known as:  LOPRESSOR  Take 1 tablet (50 mg total) by mouth 2 (two) times daily.     oxyCODONE 5 MG immediate release tablet  Commonly known as:  Oxy IR/ROXICODONE  Take 1 tablet (5 mg total) by mouth every 6 (six) hours as needed for moderate pain or severe pain.     RESOURCE THICKENUP CLEAR Powd  HONEY THICK liquids         Brief H and P: For complete details please refer to admission H and P, but in brief  The patient is a 80 year old female with history of CAD, diabetes mellitus, hypertension and COPD presented to ED on 9/29 with shortness of breath that had been ongoing for the past 2  days prior to admission. Otherwise denied any productive cough or fevers. EMS was called and patient was found to have wheezing along with diffuse rails, and respiratory distress. She was treated en route with Solu-Medrol magnesium, albuterol and placed on CPAP. O2 sats were 88%. In ED, patient was placed on BiPAP with improvement in the sats. She was also found to be in acute kidney injury, creatinine of 3.3, baseline 1.1 with lactic acidosis. Chest x-ray was concerning for right-sided pneumonia. Code sepsis was called, due to hypotension and septic shock not responding to IV fluids, patient was started on vasopressors. Patient was admitted by critical care service to the ICU.  Patient was also found to have pressure sores, had been on O2 3 L continuous. Previously she was admitted to [facility] in 12/2014 for aspiration. She is severely debilitated as she has been bed bound for past 10 years.   Palliative medicine consult was obtained on 02/04/15 for goals of care. Patient was evaluated by speech therapy on 9/30, recommended dysphagia 1 puree diet, meds crushed with puree. The patient has been followed closely by wound care as well during hospitalization. She was transferred to the stepdown unit and to the triad hospitalist service on 02/05/15.  Hospital Course:   Acute on chronic hypoxic respiratory failure due to RLL HCAP v/s Aspiration Pneumonia Patient was admitted by critical care service to the ICU. Palliative medicine consult was obtained on 9/30. She was found to have dysphagia and was evaluated by speech therapy as well on 9/30 and recommended dysphagia 1 pure diet. Patient required vasopressin is in the intensive care unit due to hypotension and septic shock not responding to IV fluids. Chest x-ray showed right-sided pneumonia. She was also found to have acute kidney injury with creatinine of 3.3 and lactic acidosis. Patient has remained stable off BiPAP. O2 sats 100%  on 2 L  Patient was evaluated by speech therapy, recommended dysphagia 1 diet, honey thick liquids, crushed pills in applesauce with full supervision. Patient was on vancomycin and Fortaz, narrowed to Levaquin on 10/3, patient will continue for 7 more doses to also cover the pseudomonas wound in the sacral/ischial pressure sores. Patient is being discharged to home hospice.  Shock - Sepsis versus dehydration - Hemodynamically stable at the present time, lactate has normalized. Off vasopressors  1/2 blood cx gram+ cocci clusters - Blood cultures shows 1/2 coagulase-negative staph, likely contaminant. DC'd vancomycin  Sacral Pressure ulcers, Pseudomonas - DME low-air-loss mattress and hospital bed arranged by the case management -Wound culture showing Pseudomonas, continue  Levaquin for 7 more doses. Santyl dressing daily, home health RN was arranged.  Acute on Chronic Renal Failure -Patient presented with creatinine of 3.39 at the time of admission likely due to sepsis and #1, baseline creatinine 1.0 as of 8/16. Acute kidney injury has resolved with gentle hydration.   Hyponatremia Improved, sodium 138 at the time of discharge  Hypokalemia - Replaced  Lactic acidosis likely due to sepsis and shock  patient presented with lactic acid of 4.63, Resolved with IV fluid hydration   Hyperphosphatemia Resolved  DM - CBG well controlled, Continue sliding scale insulin   H/O CAD Resume BB - follow   H/O HTN - Currently BP stable   Bed Bound s/p failed right knee replacement & CVA w/ residual weakness - Palliative medicine consult was obtained, recommending home hospice At this time.   Day of Discharge BP 109/51 mmHg  Pulse 79  Temp(Src) 97.7 F (36.5 C) (Oral)  Resp 16  Ht 5\' 2"  (1.575 m)  Wt 62 kg (136 lb 11 oz)  BMI 24.99 kg/m2  SpO2 100%  Physical Exam: General: Alert and awake oriented  to self and place, NAD . HEENT: anicteric sclera, pupils reactive to light and  accommodation CVS: S1-S2 clear no murmur rubs or gallops Chest: clear to auscultation bilaterally, no wheezing rales or rhonchi Abdomen: soft nontender, nondistended, normal bowel sounds Extremities: no cyanosis, clubbing, severely debilitated, bilateral lower extremities and heel protectors Skin: Stage IV sacral and ischial wound   The results of significant diagnostics from this hospitalization (including imaging, microbiology, ancillary and laboratory) are listed below for reference.    LAB RESULTS: Basic Metabolic Panel:  Recent Labs Lab 02/05/15 0634 02/06/15 0415  02/08/15 0655 02/09/15 0505  NA 139 141  < > 139 138  K 3.1* 3.9  < > 3.2* 3.5  CL 106 107  < > 108 106  CO2 19* 20*  < > 22 22  GLUCOSE 102* 123*  < > 133* 126*  BUN 63* 56*  < > 33* 23*  CREATININE 2.41* 1.87*  < > 1.26* 1.05*  CALCIUM 8.2* 8.4*  < > 7.8* 7.8*  MG 2.5*  --   --   --   --   PHOS  --  3.0  --   --   --   < > = values in this interval not displayed. Liver Function Tests:  Recent Labs Lab 02/06/15 0415  AST 14*  ALT 9*  ALKPHOS 75  BILITOT 0.9  PROT 6.0*  ALBUMIN 1.7*   No results for input(s): LIPASE, AMYLASE in the last 168 hours. No results for input(s): AMMONIA in the last 168 hours. CBC:  Recent Labs Lab 02/03/15 1847  02/08/15 0655 02/09/15 0505  WBC 25.2*  < > 15.0* 15.4*  NEUTROABS 20.4*  --   --   --   HGB 12.0  < > 8.7* 9.4*  HCT 38.1  < > 28.5* 29.9*  MCV 86.6  < > 85.6 85.9  PLT 670*  < > 464* 453*  < > = values in this interval not displayed. Cardiac Enzymes: No results for input(s): CKTOTAL, CKMB, CKMBINDEX, TROPONINI in the last 168 hours. BNP: Invalid input(s): POCBNP CBG:  Recent Labs Lab 02/09/15 0725 02/09/15 1127  GLUCAP 129* 103*    Significant Diagnostic Studies:  Dg Chest Port 1 View  02/04/2015   CLINICAL DATA:  Respiratory failure, history of COPD and pneumonia.  EXAM: PORTABLE CHEST 1 VIEW  COMPARISON:  Portable  chest x-ray of February 03, 2015  FINDINGS: The patient is rotated on today's study. The lungs are adequately inflated. Increased density at the right lung base persists. This is in part due to the dome of the diaphragm. The heart and pulmonary vascularity are normal. The bony thorax is unremarkable.  IMPRESSION: Allowing for differences in positioning there is no significant interval change in the appearance of the chest. There is persistent right lower lobe atelectasis or pneumonia. When the patient can tolerate the procedure, a PA and lateral chest x-ray would be useful.   Electronically Signed   By: David  Martinique M.D.   On: 02/04/2015 07:22   Dg Chest Port 1 View  02/03/2015   CLINICAL DATA:  Respiratory distress  EXAM: PORTABLE CHEST - 1 VIEW  COMPARISON:  12/25/2014  FINDINGS: Cardiac shadow is stable. Patient is rotated to the right accentuating the mediastinal markings. The lungs are well aerated bilaterally. Some slight increased density is noted in the right lung base which may represent some early infiltrate. No other focal abnormality is noted.  IMPRESSION: Slight increased density in the right lung base. This may represent some early infiltrate or residual from previously noted infiltrate.   Electronically Signed   By: Inez Catalina M.D.   On: 02/03/2015 18:37   Dg Swallowing Func-speech Pathology  02/04/2015    Objective Swallowing Evaluation:    Patient Details  Name: Kymani Laursen MRN: [id number] Date of Birth: 05/07/1935  Today's Date: 02/04/2015 Time: SLP Start Time (ACUTE ONLY): 1300-SLP Stop Time (ACUTE ONLY): 1330 SLP Time Calculation (min) (ACUTE ONLY): 30 min  Past Medical History:  Past Medical History  Diagnosis Date  . Stroke   . COPD (chronic obstructive pulmonary disease)   . Coronary artery disease   . Hypertension   . Diabetes mellitus type 1    Past Surgical History:  Past Surgical History  Procedure Laterality Date  . Total knee arthroplasty Bilateral    HPI:  Other Pertinent Information: 80 y.o. F  brought to [facility] ED 9/29 with HCAP.  Placed on BiPAP.  She is severely debilitated as she has been bed bound  for past 10 years. MD reports Portable CXR 9/30 shows Questionable right  basilar opacity. Otherwise hyperinflation. Possible aspiration pna.   No Data Recorded  Assessment / Plan / Recommendation CHL IP CLINICAL IMPRESSIONS 02/04/2015  Therapy Diagnosis Mild-moderate pharyngeal phase dysphagia;Mild oral phase  dysphagia  Clinical Impression Pt presents with a mild-moderate oropharyngeal based  dysphagia.  Orally pt presented with decreased posterior bolus cohesion  which resulted in premature spillage of boluses into the pharynx.  Swallow  initiation was most consistently triggered at the pyriforms with thin  liquids and at the vallecula with nectars.  Delayed swallow initiation  resulted in penetration of thin liquids and nectar thick liquids which did  not consistently clear during the swallow.  Cues for throat clear and  volitional cough were not consistently effective for clearing penetrates  from the airway.  Use of chin tuck was effective for improving airway  protection during the swallow; however, pt required cues for timing of  chin tuck.  When timing of positioning was not adequate pt was noted with  recurrence of penetration.  Questionable trace aspiration versus  penetration was noted on 1 trial of purees.  No evidence of aspiration or  penetration was evident with honey thick liquids.  Recommend a  conservative diet of dys 1 textures, honey thick liquids with full  supervision  for use of swallowing precautions.  Prognosis for advancement  good with trials of advanced consistencies during ST to reinforce use of  swallowing precautions.        CHL IP TREATMENT RECOMMENDATION 02/04/2015  Treatment Recommendations F/U MBS in --- days (Comment)     CHL IP DIET RECOMMENDATION 02/04/2015  SLP Diet Recommendations Dysphagia 1 (Puree);Honey  Liquid Administration via (None)  Medication Administration Crushed  with puree  Compensations (None)  Postural Changes and/or Swallow Maneuvers (None)     CHL IP OTHER RECOMMENDATIONS 02/04/2015  Recommended Consults (None)  Oral Care Recommendations Oral care BID  Other Recommendations Order thickener from pharmacy     CHL IP FOLLOW UP RECOMMENDATIONS 12/24/2014  Follow up Recommendations Skilled Nursing facility     [facility] IP FREQUENCY AND DURATION 02/04/2015  Speech Therapy Frequency (ACUTE ONLY) min 2x/week  Treatment Duration 2 weeks         SLP Swallow Goals No flowsheet data found.  No flowsheet data found.    CHL IP REASON FOR REFERRAL 02/04/2015  Reason for Referral Objectively evaluate swallowing function     CHL IP ORAL PHASE 02/04/2015  Lips (None)  Tongue (None)  Mucous membranes (None)  Nutritional status (None)  Other (None)  Oxygen therapy (None)  Oral Phase Impaired  Oral - Pudding Teaspoon (None)  Oral - Pudding Cup (None)  Oral - Honey Teaspoon (None)  Oral - Honey Cup (None)  Oral - Honey Syringe (None)  Oral - Nectar Teaspoon (None)  Oral - Nectar Cup (None)  Oral - Nectar Straw (None)  Oral - Nectar Syringe (None)  Oral - Ice Chips (None)  Oral - Thin Teaspoon (None)  Oral - Thin Cup (None)  Oral - Thin Straw (None)  Oral - Thin Syringe (None)  Oral - Puree (None)  Oral - Mechanical Soft (None)  Oral - Regular (None)  Oral - Multi-consistency (None)  Oral - Pill (None)  Oral Phase - Comment (None)      CHL IP PHARYNGEAL PHASE 02/04/2015  Pharyngeal Phase Impaired  Pharyngeal - Pudding Teaspoon (None)  Penetration/Aspiration details (pudding teaspoon) (None)  Pharyngeal - Pudding Cup (None)  Penetration/Aspiration details (pudding cup) (None)  Pharyngeal - Honey Teaspoon (None)  Penetration/Aspiration details (honey teaspoon) (None)  Pharyngeal - Honey Cup (None)  Penetration/Aspiration details (honey cup) (None)  Pharyngeal - Honey Syringe (None)  Penetration/Aspiration details (honey syringe) (None)  Pharyngeal - Nectar Teaspoon (None)  Penetration/Aspiration details  (nectar teaspoon) (None)  Pharyngeal - Nectar Cup (None)  Penetration/Aspiration details (nectar cup) (None)  Pharyngeal - Nectar Straw (None)  Penetration/Aspiration details (nectar straw) (None)  Pharyngeal - Nectar Syringe (None)  Penetration/Aspiration details (nectar syringe) (None)  Pharyngeal - Ice Chips (None)  Penetration/Aspiration details (ice chips) (None)  Pharyngeal - Thin Teaspoon (None)  Penetration/Aspiration details (thin teaspoon) (None)  Pharyngeal - Thin Cup (None)  Penetration/Aspiration details (thin cup) (None)  Pharyngeal - Thin Straw (None)  Penetration/Aspiration details (thin straw) (None)  Pharyngeal - Thin Syringe (None)  Penetration/Aspiration details (thin syringe') (None)  Pharyngeal - Puree (None)  Penetration/Aspiration details (puree) (None)  Pharyngeal - Mechanical Soft (None)  Penetration/Aspiration details (mechanical soft) (None)  Pharyngeal - Regular (None)  Penetration/Aspiration details (regular) (None)  Pharyngeal - Multi-consistency (None)  Penetration/Aspiration details (multi-consistency) (None)  Pharyngeal - Pill (None)  Penetration/Aspiration details (pill) (None)  Pharyngeal Comment (None)             Page, Selinda Orion 02/04/2015, 2:24 PM     2D  ECHO:   Disposition and Follow-up:     Discharge Instructions    Discharge instructions    Complete by:  As directed   Diet: Dysphagia 1 puree diet with honey thick liquids.  Wound care : Apply 1/4" thick layer of Santyl to the right ischial and sacral wounds, cover with normal saline moist gauze and top with dry ABD pads, secure with tape. Change daily, and PRN soilage.     Increase activity slowly    Complete by:  As directed             DISPOSITION: Home with home hospice, home health RN, aide  DISCHARGE FOLLOW-UP Follow-up Information    Follow up with Alvester Chou, NP. Schedule an appointment as soon as possible for a visit in 2 weeks.   Specialty:  Nurse Practitioner   Why:  for hospital follow-up    Contact information:   [facility] [place] [facility] [place] [id number] [phone]        Time spent on Discharge: 35 minutes  Signed:   Ronie Fleeger M.D. [facility] 02/09/2015, 11:58 AM Pager: [phone]

## 2015-02-09 NOTE — Progress Notes (Signed)
02/09/2015 11:27 AM  Pt's daughter, Olin Hauser, contacted by this RN to arrange discharge.  No answer via phone number provided at this time.  Message left.  Will continue to follow. Princella Pellegrini

## 2015-02-09 NOTE — Progress Notes (Signed)
Speech Language Pathology Treatment: Dysphagia  Patient Details Name: Brenda Hogan MRN: [id number] DOB: 12-Mar-1935 Today's Date: 02/09/2015 Time: 1287-8676 SLP Time Calculation (min) (ACUTE ONLY): 8 min  Assessment / Plan / Recommendation Clinical Impression  Dysphagia intervention included education re: current puree texture, importance of sitting as upright as able to tolerate during meal/snack. RN tech reported pt declined breakfast. Pt verbalized preference of remaining on a puree texture (did not desire to attempt upgraded texture for possible increase appeal and intake). Encouraged her to try small amount even when not hungry. Palliative care following. Recommend continue Dys 1 texture and honey thick liquids, consider ice chips or small tsp thin water (only) after oral care for comfort and moisture. ST will sign off.    HPI Other Pertinent Information: 80 y.o. F brought to [facility] ED 9/29 with HCAP. Placed on BiPAP.  She is severely debilitated as she has been bed bound for past 10 years. MD reports Portable CXR 9/30 shows Questionable right basilar opacity. Otherwise hyperinflation. Possible aspiration pna.    Pertinent Vitals Pain Assessment: Faces Faces Pain Scale: Hurts even more Pain Intervention(s): Repositioned  SLP Plan  Discharge SLP treatment due to (comment)    Recommendations Diet recommendations: Dysphagia 1 (puree);Honey-thick liquid Liquids provided via: Cup;Teaspoon Medication Administration: Crushed with puree Supervision: Full supervision/cueing for compensatory strategies;Trained caregiver to feed patient Compensations: Slow rate;Small sips/bites Postural Changes and/or Swallow Maneuvers: Seated upright 90 degrees              Oral Care Recommendations: Oral care QID Follow up Recommendations: None Plan: Discharge SLP treatment due to (comment)    GO     Houston Siren 02/09/2015, 10:25 AM  Orbie Pyo Colvin Caroli.Ed [employer]  [phone]

## 2015-02-09 NOTE — Progress Notes (Signed)
Patient refused dressing change

## 2015-02-09 NOTE — Care Management Note (Signed)
Case Management Note  Patient Details  Name: Brenda Hogan MRN: [id number] Date of Birth: 11/27/1934  Subjective/Objective:           CM following for progression and d/c planning.         Action/Plan: 02/09/2015 Message left for pt daughter, Jeannene Patella, this CM and charge RN unable to reach daughter. This CM spoke with Grandson who is the daytime care giver and he gave permission to d/c pt to home as he is awaiting her arrival. Pt will transport by ambulance. [facility] informed of plan to d/c and time. Grandson states that hospital bed and oxygen are in place. CSW , V Crawford notified and ambulance called.   Expected Discharge Date:       02/09/2015           Expected Discharge Plan:  Home w Hospice Care  In-House Referral:  Clinical Social Work  Discharge planning Services  CM Consult  Post Acute Care Choice:    Choice offered to:   daughter  DME Arranged:  Systems developer D DME Agency:  [facility]  HH Arranged:  RN, NA, Social Work [employer] Agency:  [facility]  Status of Service:  Completed, signed off  Medicare Important Message Given:  Yes-second notification given Date Medicare IM Given:    Medicare IM give by:    Date Additional Medicare IM Given:    Additional Medicare Important Message give by:     If discussed at [place] of Stay Meetings, dates discussed:    Additional Comments:  Adron Bene, RN 02/09/2015, 1:22 PM

## 2015-06-08 DEATH — deceased

## 2015-11-04 IMAGING — CT CT CHEST W/ CM
2 of 3 series · 15 of 36 positions shown, 18 images · IV contrast (omnipaque)
Comparison: Chest radiograph on 06/14/2014

CLINICAL DATA: Pneumonia.  Pleural effusion.

EXAM:
CT CHEST WITH CONTRAST
TECHNIQUE: Multidetector CT imaging of the chest was performed during
intravenous contrast administration.
CONTRAST:  75 mL Omnipaque 300

[Series 2: routine chest with · axial · 0.61mm/px · z∈[-892,-658]mm · 12 of 57 slices shown, 15 images]
[im 5/57  mediastinal]
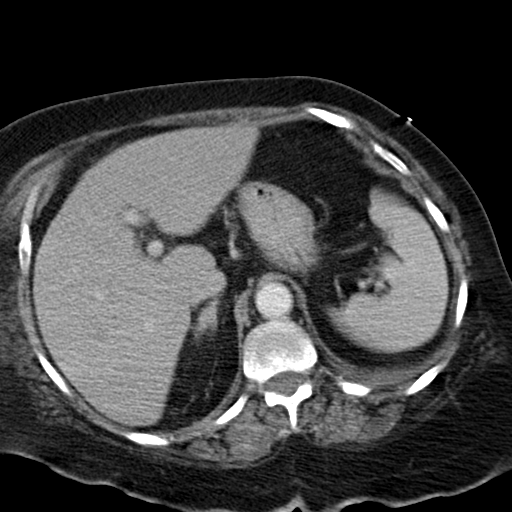
[im 5/57  lung]
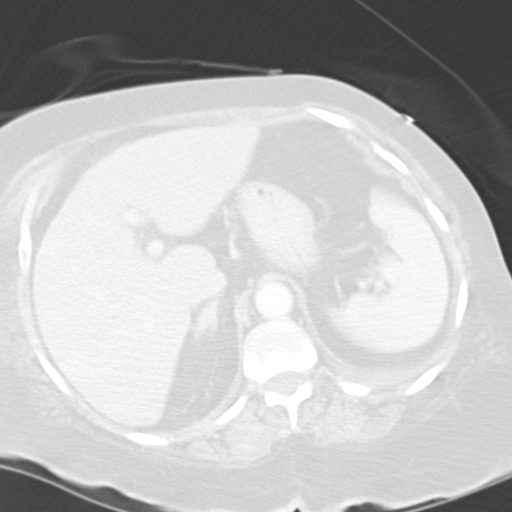
[im 9/57  lung]
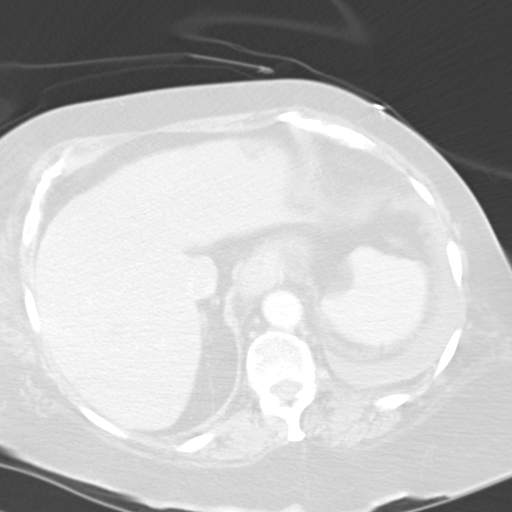
[im 13/57  lung]
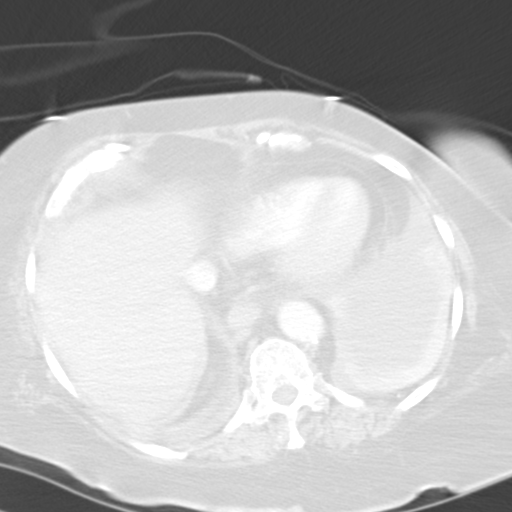
[im 17/57  lung]
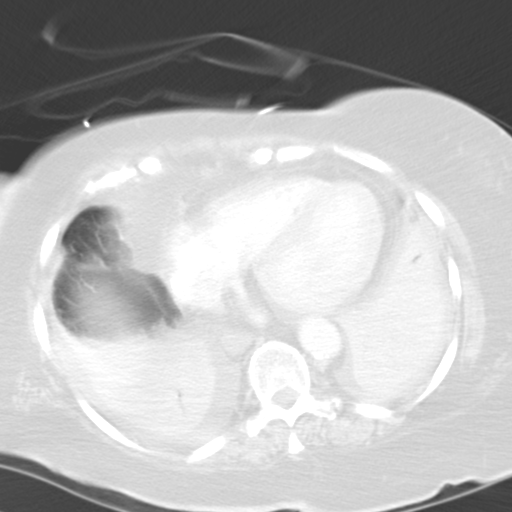
[im 21/57  mediastinal]
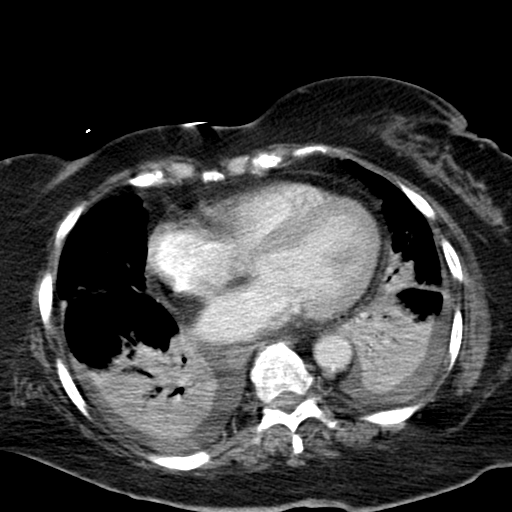
[im 21/57  lung]
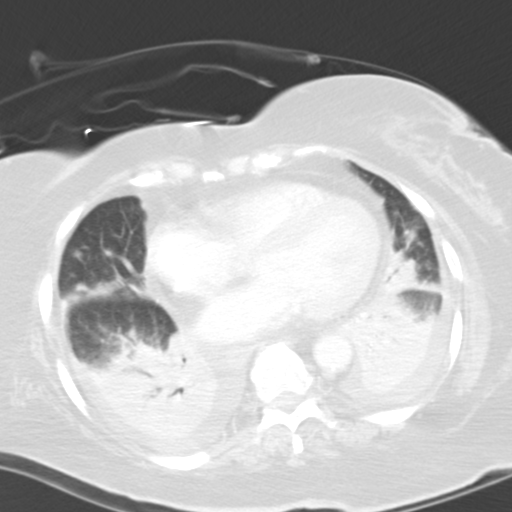
[im 25/57  lung]
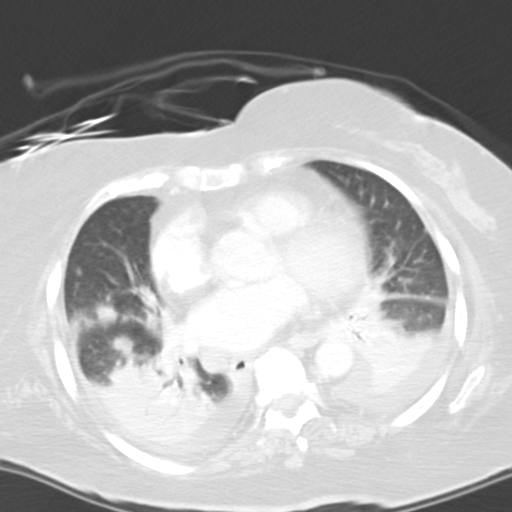
[im 32/57  lung]
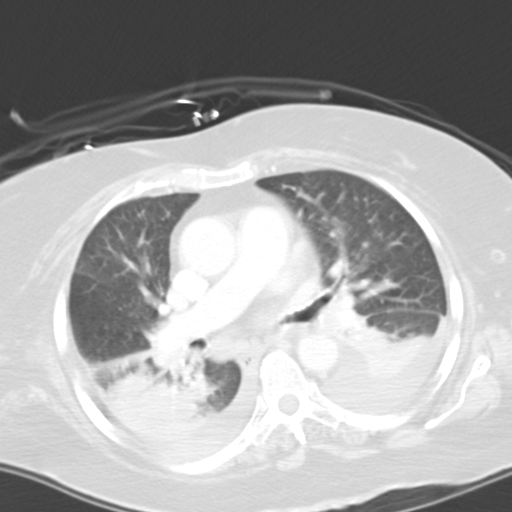
[im 36/57  lung]
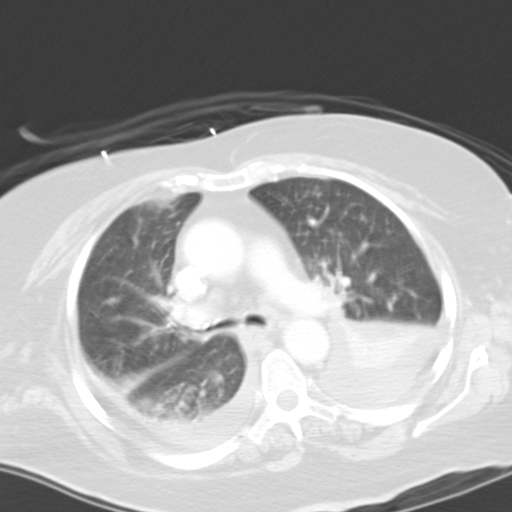
[im 40/57  mediastinal]
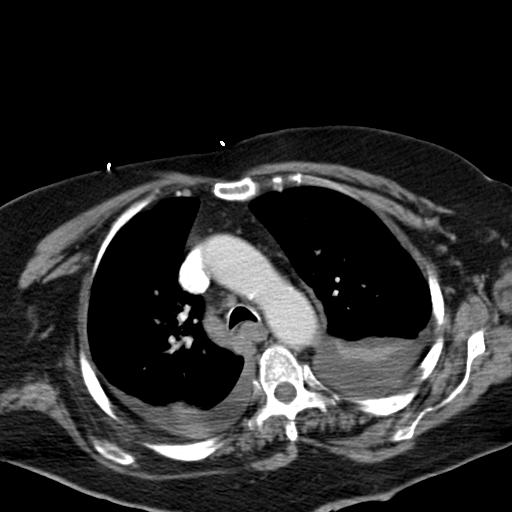
[im 40/57  lung]
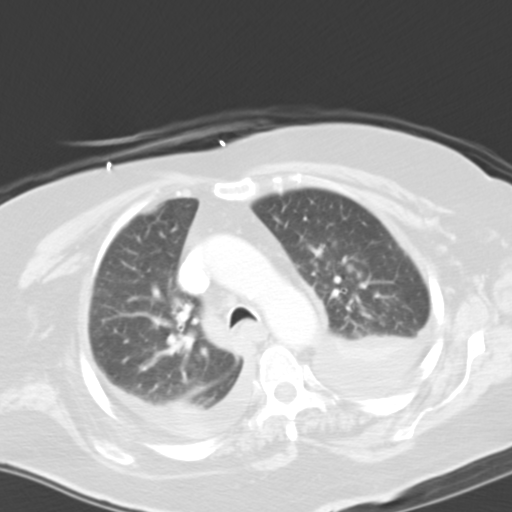
[im 44/57  lung]
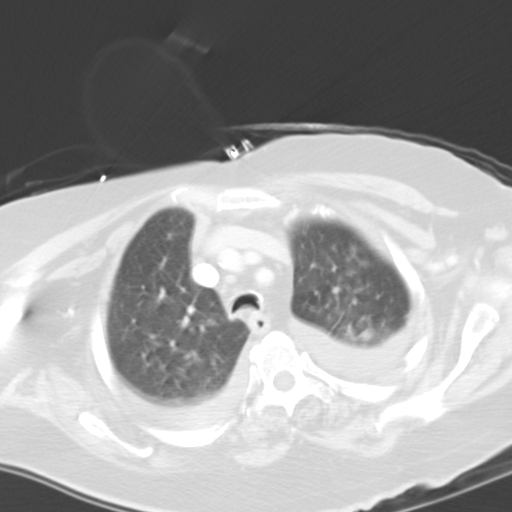
[im 48/57  lung]
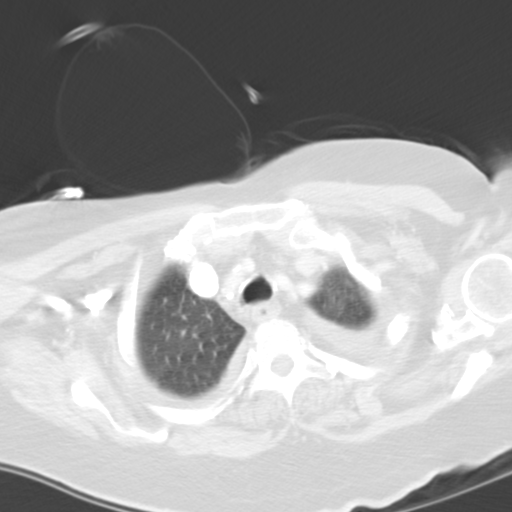
[im 52/57  lung]
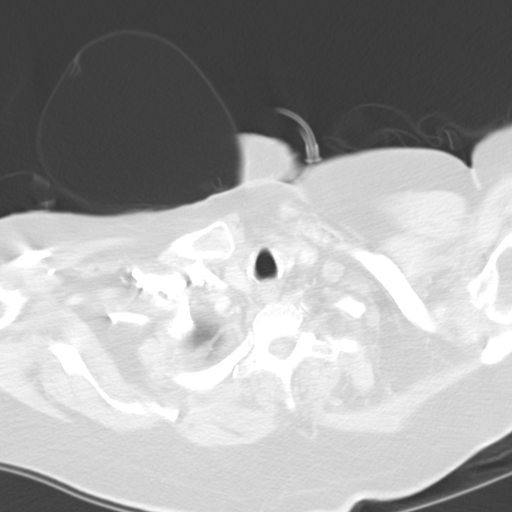

[Series 5: cor routine chest with · coronal · 0.56mm/px · 3 of 119 slices shown]
[im 24/119  lung]
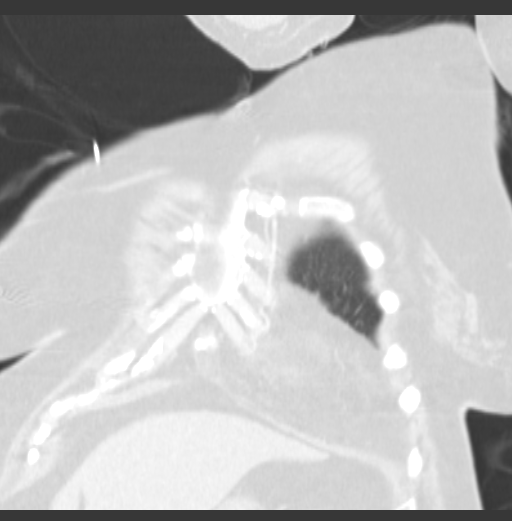
[im 48/119  lung]
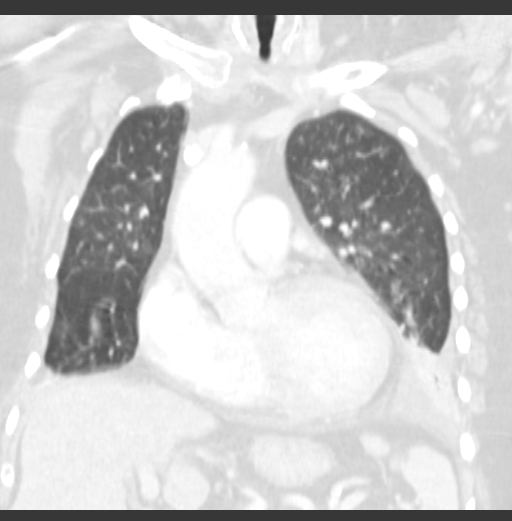
[im 71/119  lung]
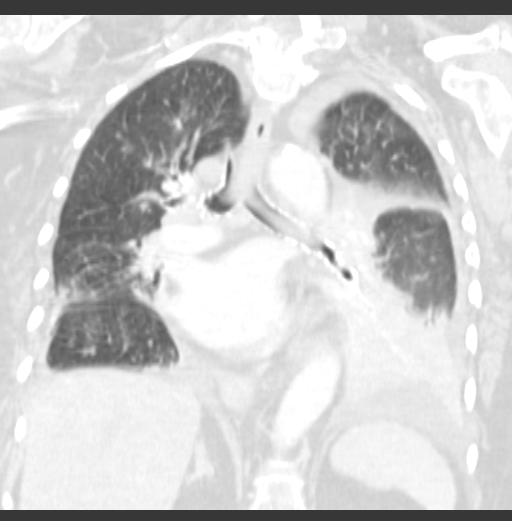

[15 of 36 positions shown; findings below may reference images not displayed]

FINDINGS: Mediastinum/Lymph Nodes: 1.5 cm mediastinal lymph nodes seen in the
azygo-esophageal recess. No other pathologically enlarged lymph
nodes identified.

Lungs/Pleura: No pulmonary infiltrate or mass identified. No
effusion present.

Musculoskeletal/Soft Tissues: No suspicious bone lesions or other
significant chest wall abnormality.

Upper Abdomen: indeterminate low attenuation liver lesion is seen in
the lateral segment of the left hepatic lobe measuring 3.4 cm.
IMPRESSION: Bilateral lower lobe predominant airspace disease, suspicious for
pneumonia. Small bilateral pleural effusions.

Mild mediastinal lymphadenopathy in the azygo-esophageal recess,
which may be reactive in etiology. Indeterminate low-attenuation
lesion in the lateral segment left hepatic lobe. Followup with chest
and abdomen CT with contrast recommended in 3 months.

## 2016-06-24 IMAGING — CR DG CHEST 1V PORT
1 series · 1 of 1 positions shown · non-contrast
Comparison: 02/04/2015 Lung

CLINICAL DATA: Acute respiratory failure.

EXAM:
PORTABLE CHEST 1 VIEW

[AP]
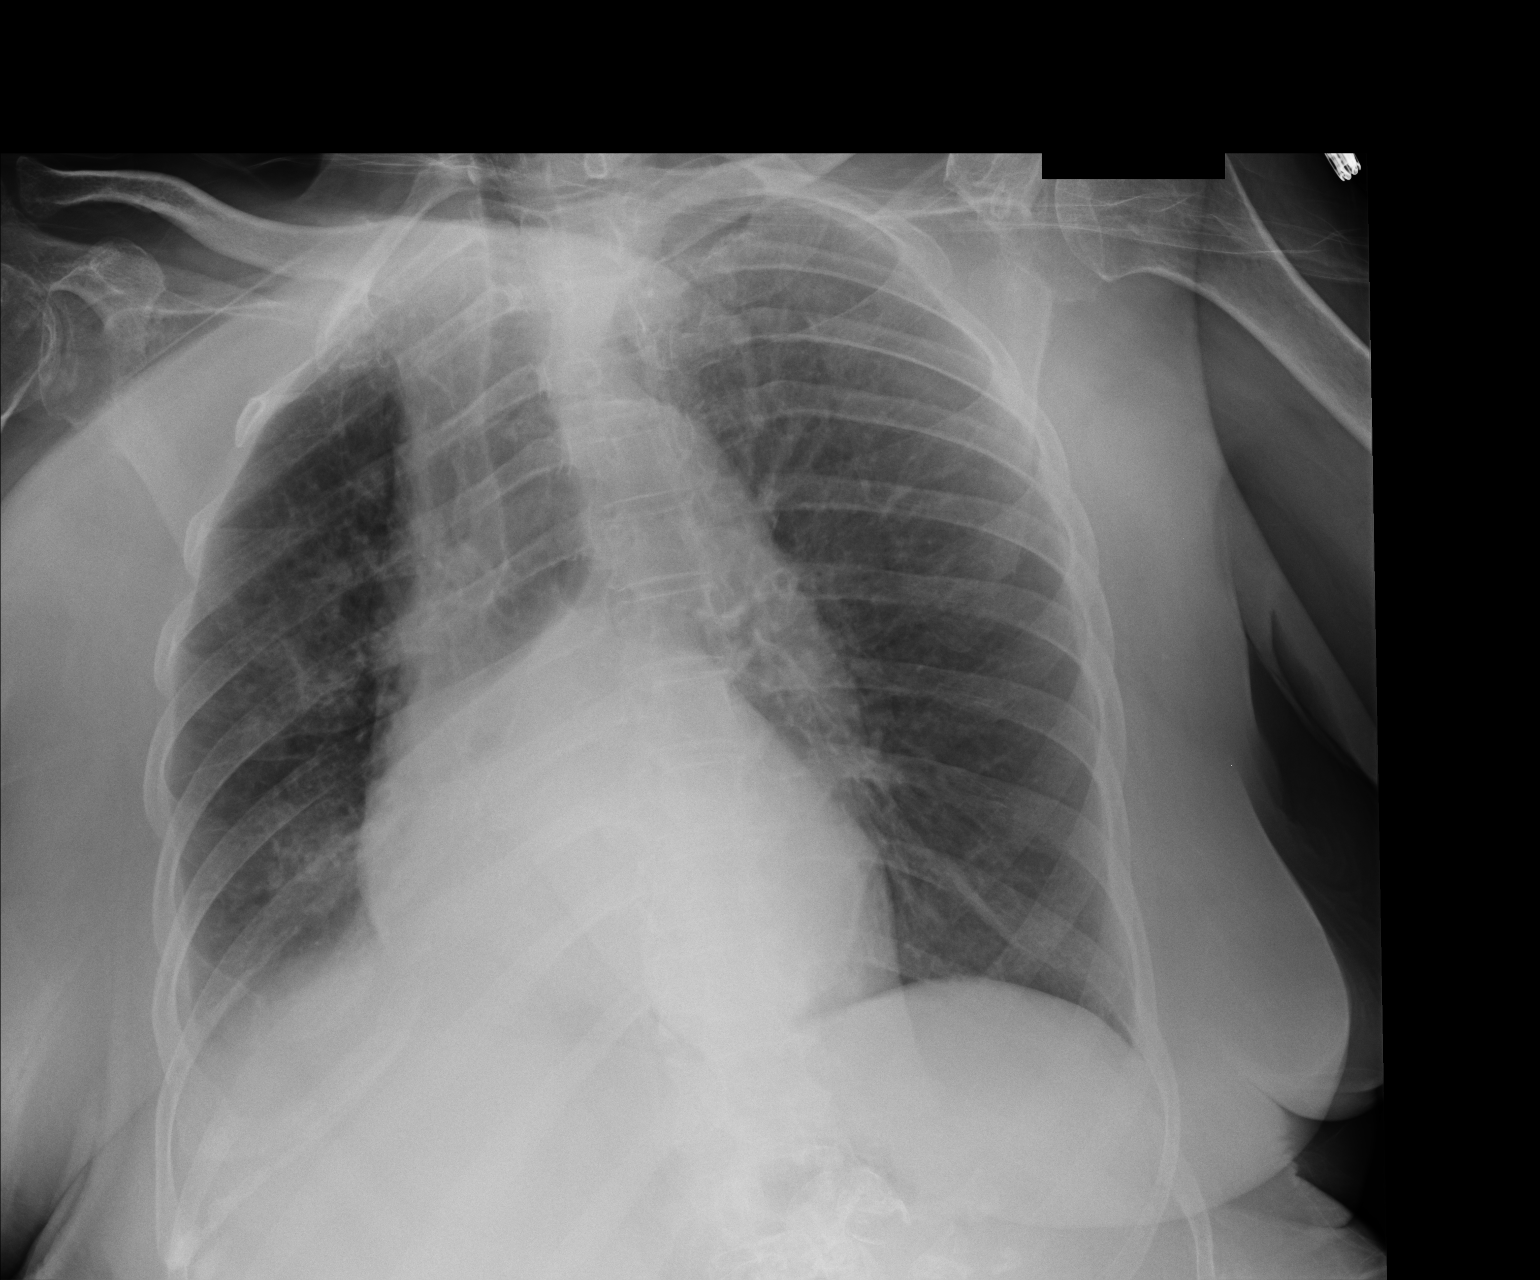

[1 of 1 positions shown; findings below may reference images not displayed]

FINDINGS: Retrocardiac density on the right is unchanged and may represent
atelectasis or pneumonia. Small right effusion has progressed. Left
lung remains clear. Negative for heart failure
IMPRESSION: Right lower lobe airspace disease is unchanged. This may represent
atelectasis or effusion. Small right effusion has increased in the
interval.
